# Patient Record
Sex: Female | Born: 1960
Health system: Southern US, Community
[De-identification: ages and names within clinical notes are randomized; demographics above are authoritative.]

## PROBLEM LIST (undated history)

## (undated) DIAGNOSIS — I6529 Occlusion and stenosis of unspecified carotid artery: Secondary | ICD-10-CM

## (undated) DIAGNOSIS — Z720 Tobacco use: Secondary | ICD-10-CM

## (undated) DIAGNOSIS — F1910 Other psychoactive substance abuse, uncomplicated: Secondary | ICD-10-CM

## (undated) DIAGNOSIS — I2510 Atherosclerotic heart disease of native coronary artery without angina pectoris: Secondary | ICD-10-CM

---

## 2017-11-11 DIAGNOSIS — I639 Cerebral infarction, unspecified: Secondary | ICD-10-CM

## 2017-11-11 HISTORY — DX: Cerebral infarction, unspecified: I63.9

## 2020-08-11 DIAGNOSIS — F1011 Alcohol abuse, in remission: Secondary | ICD-10-CM

## 2020-08-11 HISTORY — DX: Alcohol abuse, in remission: F10.11

## 2020-10-25 ENCOUNTER — Inpatient Hospital Stay (HOSPITAL_COMMUNITY): Payer: Medicaid Other

## 2020-10-25 ENCOUNTER — Emergency Department (HOSPITAL_COMMUNITY): Payer: Medicaid Other

## 2020-10-25 ENCOUNTER — Inpatient Hospital Stay (HOSPITAL_COMMUNITY)
Admission: EM | Admit: 2020-10-25 | Discharge: 2020-11-01 | DRG: 062 | Disposition: A | Discharge status: Rehabilitation Facility | Payer: Medicaid Other | Attending: Neurology | Admitting: Neurology

## 2020-10-25 DIAGNOSIS — Z23 Encounter for immunization: Secondary | ICD-10-CM | POA: Diagnosis not present

## 2020-10-25 DIAGNOSIS — R414 Neurologic neglect syndrome: Secondary | ICD-10-CM | POA: Diagnosis present

## 2020-10-25 DIAGNOSIS — H903 Sensorineural hearing loss, bilateral: Secondary | ICD-10-CM | POA: Diagnosis present

## 2020-10-25 DIAGNOSIS — I6381 Other cerebral infarction due to occlusion or stenosis of small artery: Secondary | ICD-10-CM | POA: Diagnosis present

## 2020-10-25 DIAGNOSIS — R112 Nausea with vomiting, unspecified: Secondary | ICD-10-CM | POA: Diagnosis not present

## 2020-10-25 DIAGNOSIS — R4781 Slurred speech: Secondary | ICD-10-CM | POA: Diagnosis present

## 2020-10-25 DIAGNOSIS — I2510 Atherosclerotic heart disease of native coronary artery without angina pectoris: Secondary | ICD-10-CM | POA: Diagnosis present

## 2020-10-25 DIAGNOSIS — R29709 NIHSS score 9: Secondary | ICD-10-CM | POA: Diagnosis present

## 2020-10-25 DIAGNOSIS — R471 Dysarthria and anarthria: Secondary | ICD-10-CM | POA: Diagnosis present

## 2020-10-25 DIAGNOSIS — I639 Cerebral infarction, unspecified: Secondary | ICD-10-CM

## 2020-10-25 DIAGNOSIS — Z20822 Contact with and (suspected) exposure to covid-19: Secondary | ICD-10-CM | POA: Diagnosis present

## 2020-10-25 DIAGNOSIS — R29810 Facial weakness: Secondary | ICD-10-CM | POA: Diagnosis present

## 2020-10-25 DIAGNOSIS — Z79891 Long term (current) use of opiate analgesic: Secondary | ICD-10-CM | POA: Diagnosis not present

## 2020-10-25 DIAGNOSIS — Z79899 Other long term (current) drug therapy: Secondary | ICD-10-CM

## 2020-10-25 DIAGNOSIS — I6523 Occlusion and stenosis of bilateral carotid arteries: Secondary | ICD-10-CM | POA: Diagnosis present

## 2020-10-25 DIAGNOSIS — Z7902 Long term (current) use of antithrombotics/antiplatelets: Secondary | ICD-10-CM

## 2020-10-25 DIAGNOSIS — I6503 Occlusion and stenosis of bilateral vertebral arteries: Secondary | ICD-10-CM | POA: Diagnosis present

## 2020-10-25 DIAGNOSIS — Z95828 Presence of other vascular implants and grafts: Secondary | ICD-10-CM

## 2020-10-25 DIAGNOSIS — H905 Unspecified sensorineural hearing loss: Secondary | ICD-10-CM

## 2020-10-25 DIAGNOSIS — I69354 Hemiplegia and hemiparesis following cerebral infarction affecting left non-dominant side: Secondary | ICD-10-CM

## 2020-10-25 DIAGNOSIS — F419 Anxiety disorder, unspecified: Secondary | ICD-10-CM | POA: Diagnosis present

## 2020-10-25 DIAGNOSIS — I6389 Other cerebral infarction: Secondary | ICD-10-CM

## 2020-10-25 DIAGNOSIS — F17210 Nicotine dependence, cigarettes, uncomplicated: Secondary | ICD-10-CM | POA: Diagnosis present

## 2020-10-25 DIAGNOSIS — W1830XA Fall on same level, unspecified, initial encounter: Secondary | ICD-10-CM | POA: Diagnosis present

## 2020-10-25 DIAGNOSIS — Z7982 Long term (current) use of aspirin: Secondary | ICD-10-CM | POA: Diagnosis not present

## 2020-10-25 DIAGNOSIS — G8191 Hemiplegia, unspecified affecting right dominant side: Secondary | ICD-10-CM | POA: Diagnosis present

## 2020-10-25 DIAGNOSIS — Z716 Tobacco abuse counseling: Secondary | ICD-10-CM

## 2020-10-25 DIAGNOSIS — E785 Hyperlipidemia, unspecified: Secondary | ICD-10-CM | POA: Diagnosis present

## 2020-10-25 DIAGNOSIS — F1010 Alcohol abuse, uncomplicated: Secondary | ICD-10-CM

## 2020-10-25 DIAGNOSIS — Z8673 Personal history of transient ischemic attack (TIA), and cerebral infarction without residual deficits: Secondary | ICD-10-CM

## 2020-10-25 DIAGNOSIS — R739 Hyperglycemia, unspecified: Secondary | ICD-10-CM | POA: Diagnosis present

## 2020-10-25 HISTORY — DX: Other psychoactive substance abuse, uncomplicated: F19.10

## 2020-10-25 HISTORY — DX: Atherosclerotic heart disease of native coronary artery without angina pectoris: I25.10

## 2020-10-25 HISTORY — DX: Occlusion and stenosis of unspecified carotid artery: I65.29

## 2020-10-25 HISTORY — DX: Tobacco use: Z72.0

## 2020-10-25 LAB — ECHOCARDIOGRAM COMPLETE
Area-P 1/2: 2.91 cm2
Height: 63 in
S' Lateral: 2.6 cm
Weight: 2387.2 oz

## 2020-10-25 LAB — CBC
HCT: 37.6 % (ref 36.0–46.0)
Hemoglobin: 12.9 g/dL (ref 12.0–15.0)
MCH: 33.1 pg (ref 26.0–34.0)
MCHC: 34.3 g/dL (ref 30.0–36.0)
MCV: 96.4 fL (ref 80.0–100.0)
Platelets: 263 10*3/uL (ref 150–400)
RBC: 3.9 MIL/uL (ref 3.87–5.11)
RDW: 12.6 % (ref 11.5–15.5)
WBC: 6 10*3/uL (ref 4.0–10.5)
nRBC: 0 % (ref 0.0–0.2)

## 2020-10-25 LAB — DIFFERENTIAL
Abs Immature Granulocytes: 0.02 10*3/uL (ref 0.00–0.07)
Basophils Absolute: 0 10*3/uL (ref 0.0–0.1)
Basophils Relative: 1 %
Eosinophils Absolute: 0.3 10*3/uL (ref 0.0–0.5)
Eosinophils Relative: 5 %
Immature Granulocytes: 0 %
Lymphocytes Relative: 22 %
Lymphs Abs: 1.3 10*3/uL (ref 0.7–4.0)
Monocytes Absolute: 0.6 10*3/uL (ref 0.1–1.0)
Monocytes Relative: 10 %
Neutro Abs: 3.8 10*3/uL (ref 1.7–7.7)
Neutrophils Relative %: 62 %

## 2020-10-25 LAB — PHOSPHORUS: Phosphorus: 3.7 mg/dL (ref 2.5–4.6)

## 2020-10-25 LAB — TROPONIN I (HIGH SENSITIVITY)
Troponin I (High Sensitivity): 7 ng/L (ref <18)
Troponin I (High Sensitivity): 9 ng/L (ref <18)

## 2020-10-25 LAB — HEMOGLOBIN A1C
Hgb A1c MFr Bld: 4.9 % (ref 4.8–5.6)
Mean Plasma Glucose: 93.93 mg/dL

## 2020-10-25 LAB — URINALYSIS, ROUTINE W REFLEX MICROSCOPIC
Bilirubin Urine: NEGATIVE
Glucose, UA: NEGATIVE mg/dL
Hgb urine dipstick: NEGATIVE
Ketones, ur: NEGATIVE mg/dL
Nitrite: NEGATIVE
Protein, ur: NEGATIVE mg/dL
Specific Gravity, Urine: 1.016 (ref 1.005–1.030)
pH: 6 (ref 5.0–8.0)

## 2020-10-25 LAB — LIPID PANEL
Cholesterol: 288 mg/dL — ABNORMAL HIGH (ref 0–200)
HDL: 61 mg/dL (ref >40)
LDL Cholesterol: 213 mg/dL — ABNORMAL HIGH (ref 0–99)
Total CHOL/HDL Ratio: 4.7 RATIO
Triglycerides: 70 mg/dL (ref <150)
VLDL: 14 mg/dL (ref 0–40)

## 2020-10-25 LAB — I-STAT CHEM 8, ED
BUN: 16 mg/dL (ref 6–20)
Calcium, Ion: 1.22 mmol/L (ref 1.15–1.40)
Chloride: 106 mmol/L (ref 98–111)
Creatinine, Ser: 0.9 mg/dL (ref 0.44–1.00)
Glucose, Bld: 108 mg/dL — ABNORMAL HIGH (ref 70–99)
HCT: 39 % (ref 36.0–46.0)
Hemoglobin: 13.3 g/dL (ref 12.0–15.0)
Potassium: 4.1 mmol/L (ref 3.5–5.1)
Sodium: 139 mmol/L (ref 135–145)
TCO2: 24 mmol/L (ref 22–32)

## 2020-10-25 LAB — PROTIME-INR
INR: 1 (ref 0.8–1.2)
Prothrombin Time: 12.3 seconds (ref 11.4–15.2)

## 2020-10-25 LAB — COMPREHENSIVE METABOLIC PANEL
ALT: 13 U/L (ref 0–44)
AST: 24 U/L (ref 15–41)
Albumin: 3.6 g/dL (ref 3.5–5.0)
Alkaline Phosphatase: 65 U/L (ref 38–126)
Anion gap: 12 (ref 5–15)
BUN: 15 mg/dL (ref 6–20)
CO2: 21 mmol/L — ABNORMAL LOW (ref 22–32)
Calcium: 9.6 mg/dL (ref 8.9–10.3)
Chloride: 104 mmol/L (ref 98–111)
Creatinine, Ser: 0.96 mg/dL (ref 0.44–1.00)
GFR, Estimated: >60 mL/min (ref >60)
Glucose, Bld: 107 mg/dL — ABNORMAL HIGH (ref 70–99)
Potassium: 4.4 mmol/L (ref 3.5–5.1)
Sodium: 137 mmol/L (ref 135–145)
Total Bilirubin: 0.5 mg/dL (ref 0.3–1.2)
Total Protein: 6.8 g/dL (ref 6.5–8.1)

## 2020-10-25 LAB — ETHANOL: Alcohol, Ethyl (B): <10 mg/dL (ref <10)

## 2020-10-25 LAB — RAPID URINE DRUG SCREEN, HOSP PERFORMED
Amphetamines: NOT DETECTED
Barbiturates: NOT DETECTED
Benzodiazepines: NOT DETECTED
Cocaine: NOT DETECTED
Opiates: NOT DETECTED
Tetrahydrocannabinol: NOT DETECTED

## 2020-10-25 LAB — I-STAT BETA HCG BLOOD, ED (MC, WL, AP ONLY): I-stat hCG, quantitative: <5 m[IU]/mL (ref <5)

## 2020-10-25 LAB — APTT: aPTT: 28 seconds (ref 24–36)

## 2020-10-25 LAB — RESP PANEL BY RT-PCR (FLU A&B, COVID) ARPGX2
Influenza A by PCR: NEGATIVE
Influenza B by PCR: NEGATIVE
SARS Coronavirus 2 by RT PCR: NEGATIVE

## 2020-10-25 LAB — MAGNESIUM: Magnesium: 2.1 mg/dL (ref 1.7–2.4)

## 2020-10-25 LAB — CBG MONITORING, ED: Glucose-Capillary: 101 mg/dL — ABNORMAL HIGH (ref 70–99)

## 2020-10-25 MED ORDER — SODIUM CHLORIDE 0.9 % IV SOLN
INTRAVENOUS | Status: DC
Start: 2020-10-25 — End: 2020-11-01

## 2020-10-25 MED ORDER — ACETAMINOPHEN 650 MG RE SUPP: 650.0000 mg | RECTAL | Status: DC | PRN

## 2020-10-25 MED ORDER — STROKE: EARLY STAGES OF RECOVERY BOOK
Freq: Once | Status: AC
  Filled 2020-10-25: qty 1

## 2020-10-25 MED ORDER — SENNOSIDES-DOCUSATE SODIUM 8.6-50 MG PO TABS: 1.0000 | ORAL_TABLET | Freq: Every evening | ORAL | Status: DC | PRN

## 2020-10-25 MED ORDER — SODIUM CHLORIDE 0.9 % IV SOLN
50.0000 mL | Freq: Once | INTRAVENOUS | Status: AC
  Administered 2020-10-25: 12:00:00 50 mL via INTRAVENOUS

## 2020-10-25 MED ORDER — ONDANSETRON HCL 4 MG/2ML IJ SOLN
4.0000 mg | Freq: Four times a day (QID) | INTRAMUSCULAR | Status: DC | PRN
  Administered 2020-10-25: 12:00:00 4 mg via INTRAVENOUS
  Filled 2020-10-25: qty 2

## 2020-10-25 MED ORDER — PANTOPRAZOLE SODIUM 40 MG IV SOLR
40.0000 mg | Freq: Every day | INTRAVENOUS | Status: DC
  Administered 2020-10-25: 23:00:00 40 mg via INTRAVENOUS
  Filled 2020-10-25: qty 40

## 2020-10-25 MED ORDER — CHLORHEXIDINE GLUCONATE CLOTH 2 % EX PADS
6.0000 | MEDICATED_PAD | Freq: Every day | CUTANEOUS | Status: DC
  Administered 2020-10-26: 15:00:00 6 via TOPICAL

## 2020-10-25 MED ORDER — ACETAMINOPHEN 160 MG/5ML PO SOLN: 650.0000 mg | ORAL | Status: DC | PRN

## 2020-10-25 MED ORDER — TRAZODONE HCL 50 MG PO TABS
75.0000 mg | ORAL_TABLET | Freq: Every day | ORAL | Status: DC
  Administered 2020-10-26 – 2020-10-31 (×6): 75 mg via ORAL
  Filled 2020-10-25 (×6): qty 2

## 2020-10-25 MED ORDER — ACETAMINOPHEN 325 MG PO TABS
650.0000 mg | ORAL_TABLET | ORAL | Status: DC | PRN
  Administered 2020-10-28: 650 mg via ORAL
  Filled 2020-10-25: qty 2

## 2020-10-25 MED ORDER — BUSPIRONE HCL 10 MG PO TABS
10.0000 mg | ORAL_TABLET | Freq: Two times a day (BID) | ORAL | Status: DC
  Administered 2020-10-26 – 2020-11-01 (×11): 10 mg via ORAL
  Filled 2020-10-25 (×11): qty 1

## 2020-10-25 MED ORDER — SERTRALINE HCL 50 MG PO TABS: 100.0000 mg | ORAL_TABLET | Freq: Every day | ORAL | Status: DC

## 2020-10-25 MED ORDER — CLEVIDIPINE BUTYRATE 0.5 MG/ML IV EMUL: 0.0000 mg/h | INTRAVENOUS | Status: DC

## 2020-10-25 MED ORDER — ALTEPLASE (STROKE) FULL DOSE INFUSION
0.9000 mg/kg | Freq: Once | INTRAVENOUS | Status: AC
  Administered 2020-10-25: 11:00:00 60.9 mg via INTRAVENOUS
  Filled 2020-10-25: qty 100

## 2020-10-25 MED ORDER — LABETALOL HCL 5 MG/ML IV SOLN: 5.0000 mg | INTRAVENOUS | Status: DC | PRN

## 2020-10-25 NOTE — ED Triage Notes (Addendum)
Pt to ED via EMS from home, pt daughter called 911 this morning because when pt called her, her speech was slurred. On EMS arrival pt was on the floor, unwitnessed fall, right side weakness and facial droop noted by EMS, a&o x 4. Follow commands. , Last known well 12 midnight last night. HX stroke . Last VS: 126/60, 98%, P 90, CBG 118. #20 R Wrist. No medications given by EMS.

## 2020-10-25 NOTE — ED Notes (Signed)
Echo at bedside

## 2020-10-25 NOTE — Consult Note (Addendum)
Neurology Consultation Reason for Consult: Right sided weakness Requesting Physician: Vanetta Mulders  CC: Right sided weakness  History is obtained from: Patient, EDP and chart review   HPI: Rebecca Sutton is a 59 y.o. female with a past medical history significant for bilateral carotid stenosis, prior right carotid stent due to stroke presenting with left-sided weakness, ongoing tobacco abuse, sensorineural hearing loss bilateral ears.   She was in her usual state of health when she went to bed last night at around midnight or 12:30 AM.  When she woke up she had right-sided weakness.  She attempted to ambulate to the bathroom but fell, and therefore called EMS for assistance.  Code stroke was activated by ED provider Dr. Vanetta Mulders after the patient arrived.  After head CT confirmed no clear acute process, MRI was obtained on my recommendations to treat per wake-up stroke criteria given the disabling nature of her deficits.  After confirmation of minimal FLAIR change on MRI with significant DWI change, tPA checklist was completed with patient and family, and tPA administration was begun.  She did experience 2 episodes of emesis after tPA was begun.  Her NIH score initially was improving, with improving movement of her right upper extremity and improving sensation, but then worsened again for which repeat head CT was obtained and showed no intracranial hemorrhage.  On review of systems, she noted no significant headaches other than her baseline headaches, no bleeding including no bloody emesis, coffee-ground emesis, blood in her stool, blood in her urine, no recent procedures, she denied fevers, chills, sweats, or any other signs and symptoms of recent infection  Regarding her prior stroke history, she presented to [facility] on 09/24/2018 with dysarthria and left facial droop, left-sided weakness and sensory deficit.  She was treated with tPA, and underwent right carotid  artery stent placement  LKW: Midnight tPA given?: Yes per wakeup stroke protocol 10:45 AM  Delayed due to need for MRI due to wake-up stroke Premorbid modified rankin scale: 0-1     0 - No symptoms.     1 - No significant disability. Able to carry out all usual activities, despite some symptoms.  ROS: A 14 point ROS was performed and is negative except as noted in the HPI.    Per outside records, additionally confirmed with patient and daughter  Carotid stenosis, bilateral, s/p L carotid stent  Acute ischemic stroke (HCC)   Cigarette smoker   Sensorineural hearing loss (SNHL) of both ears   Sudden hearing loss, left   Anxiety  Alcohol use, now quit  C-section   Family Hx:  Father: Unknown to patient Mother: hypertension, DM, cardiac disease "someone on her mother's side had a stroke"  Current Outpatient Medications: per 05/10/2020 ENT visit to be confirmed by pharmacy . aspirin 325 MG tablet *ANTIPLATELET*, Take 1 tablet (325 mg total) by mouth daily., Disp: 30 tablet, Rfl: 2 . atorvastatin (LIPITOR) 80 MG tablet, Take 1 tablet (80 mg total) by mouth daily., Disp: 90 tablet, Rfl: 3 . busPIRone (BUSPAR) 10 MG tablet, Take one (1) tablet by mouth twice a day, Disp: , Rfl:  . clopidogrel (PLAVIX) 75 mg tablet *ANTIPLATELET*, Take 1 tablet (75 mg total) by mouth daily for 28 days. Start date: 09/27/18 Stop date: 10/24/18, Disp: 28 tablet, Rfl: 0 . multivitamin (THERAGRAN) tablet, Take 1 tablet by mouth daily., Disp: 30 tablet, Rfl: 2 . nicotine (NICODERM CQ) 21 mg/24 hr, Place 1 patch onto the skin daily., Disp: 28  patch, Rfl: 0 . sertraline (ZOLOFT) 100 MG tablet, Take 200 mg by mouth., Disp: , Rfl:  . traZODone (DESYREL) 150 MG tablet, Take 75 mg by mouth daily. , Disp: , Rfl:   Social History:   Last alcohol use 09/04/2020, previously "severe alcoholic for years"   crack cocaine use- 20 years ago   tobacco use 1ppd since age 45, working on quitting and down to 1/2 ppd  currently  Exam: Current vital signs: BP 129/82   Pulse 76   Temp 98.1 F (36.7 C) (Oral)   Resp 12   Ht 5\' 3"  (1.6 m)   Wt 59 kg   SpO2 99%   BMI 23.03 kg/m  Vital signs in last 24 hours: Temp:  [98.1 F (36.7 C)] 98.1 F (36.7 C) (12/15 0928) Pulse Rate:  [76] 76 (12/15 0928) Resp:  [12] 12 (12/15 0928) BP: (129)/(82) 129/82 (12/15 0928) SpO2:  [99 %] 99 % (12/15 0928) Weight:  [59 kg] 59 kg (12/15 0930)   Physical Exam  Constitutional: Appears well-developed and well-nourished.  Psych: Affect initially appropriate to situation but then tearful to the point she couldn't answer questions well  Eyes: No scleral injection HENT: No OP obstruction, very poor dentition MSK: no joint deformities.  Cardiovascular: Perfusing extremities well, normal rate Respiratory: Effort normal, non-labored breathing GI: Soft.  No distension.  Nontender Skin: WDI  Neuro: Mental Status: Patient is awake, alert, oriented to person, place, month, and situation. Patient is able to give some history No signs of aphasia or neglect Cranial Nerves: II: Visual Fields are full. Pupils are equal, round, and reactive to light.  4->2 mm brisk III,IV, VI: EOMI for lateral gaze without ptosis or diploplia on lateral gaze V: Facial sensation is reduced to light touch on the right V2, V3 VII: Facial movement is with right lower facial droop.  VIII: hearing is intact to voice X: Uvula elevates symmetrically  XI: Shoulder shrug is symmetric. XII: tongue is midline without atrophy or fasciculations.  Motor: Tone is normal. Bulk is normal. 5/5 strength was present in all four extremities.  Sensory: Sensation is reduced on the right arm and leg, ~10%-50% compared to the left.  Deep Tendon Reflexes: 2+ and symmetric in the biceps and patellae.  Plantars: Toes are downgoing bilaterally.  Cerebellar: FNF and HKS are intact one the left and within limits of weakness on the right  NIHSS total 9 Score  breakdown:  2 points for facial droop 3 points for right upper extremity with only slight movement in the fingers, not antigravity 2 points for right lower extremity weakness 1 point for mild sensory loss 1 point for dysarthria  I have reviewed labs in epic and the results pertinent to this consultation are: Cr 0.9  I have reviewed the images obtained: CT head w/ chronic microvascular disease, no clear acute process but low sensitivity given background of chronic changes MRI brain, acute left internal capsule stroke on DWI, minimal FLAIR correlate    Impression: This is a 59 year old right-handed woman with vascular risk factors as above, presenting with a lacunar stroke which was treated with TPA by wake up criteria based on MRI brain as above.  Most likely etiology given location and small vessel disease, though atheroembolic cannot be excluded.  She did have some slight worsening when I checked on her approximately 1 hour post tPA, felt to be stuttering of her lacunar stroke with hemorrhage ruled out with repeat head CT.    # L  internal capsule stroke - Stroke labs HgbA1c, fasting lipid panel - MRI brain full protocol 24 hrs. post tPA - MRA of the brain without contrast and MRA neck w/wo  - Frequent neuro checks - Echocardiogram - Holding antiplatelets for 24 hours post tPA, will need to to clarify with patient whether she had completed DAPT for prior stroke/carotid stent given neither aspirin or Plavix were on her pharmacy reconciled medication list - Risk factor modification, smoking cessation counseling - Telemetry monitoring; 30 day event monitor on discharge if no arrythmias captured  - Blood pressure goal   - Post tPA for 24  hours < 180/105 (has not yet needed medications, but labetalol PRN ordered) - PT consult, OT consult, speech consult,  - Admitted to stroke team   # Psychiatric medications Timed to start tomorrow given patient has failed swallow evaluation;  additionally did not want to cloud neurological examination with sedating medications overnight -BuSpar 10 mg twice daily -Sertraline 100 mg daily -Trazodone 75 mg nightly  #Nausea and emesis -EKG and troponins obtained, not concerning for ACS -Zofran administered after confirming QTC less than 500 on EKG -Every 4 hours point-of-care glucose while n.p.o. given urine ketones  Brooke Dare MD-PhD [facility] [phone]  75 minutes were spent in critical care of this patient

## 2020-10-25 NOTE — Progress Notes (Signed)
Patient with another episode of emesis, complains of headache that she states she has had on-going since arrival but has not worsened. NIH stable. Dr. Iver Nestle notified of episode of emesis, no new orders. PRN given.  Aris Lot, RN Stroke Response

## 2020-10-25 NOTE — Progress Notes (Signed)
Verbal order from Dr. Iver Nestle for STAT head CT r/t worsening R weakness, obtained and patient back to ED30.   Aris Lot, RN Stroke Response

## 2020-10-25 NOTE — ED Provider Notes (Signed)
[facility] EMERGENCY DEPARTMENT Provider Note   CSN: [id number] Arrival date & time: 10/25/20  5638     History Chief Complaint  Patient presents with   Fall   Weakness    Right side     Rebecca Sutton is a 59 y.o. female.  According to daughter.  They were concerned about stroke is why they called EMS.  Daughter did a Museum/gallery curator with her mother and could see the deficits.  Mother states she went to bed around 1230.  This is after midnight.  And had some questions of may be some quivering in the right lower extremity when she went to bed.  But no other symptoms.  She woke sometime around 7 in the morning had right facial droop significant right arm weakness and right leg weakness.  Try to go to the bathroom and fell.  He was on the floor when EMS arrived.  Complaining of any pain or injuries from the fall.  Daughter states that sometime in the past at [facility] she had a stent placed.  Due to prior stroke that resulted in left sided deficits.  Daughter says the stents on the left which does not necessarily add up.  Patient does meet stroke criteria although may be out of the TPA window.  So code stroke activated.        No past medical history on file.  There are no problems to display for this patient.      OB History   No obstetric history on file.     No family history on file.     Home Medications Prior to Admission medications   Not on File    Allergies    Patient has no allergy information on record.  Review of Systems   Review of Systems  Constitutional: Negative for chills and fever.  HENT: Negative for congestion, rhinorrhea and sore throat.   Eyes: Negative for visual disturbance.  Respiratory: Negative for cough and shortness of breath.   Cardiovascular: Negative for chest pain and leg swelling.  Gastrointestinal: Negative for abdominal pain, diarrhea, nausea and vomiting.  Genitourinary: Negative for dysuria.   Musculoskeletal: Negative for back pain and neck pain.  Skin: Negative for rash.  Neurological: Positive for facial asymmetry, speech difficulty and weakness. Negative for dizziness, light-headedness and headaches.  Hematological: Does not bruise/bleed easily.  Psychiatric/Behavioral: Negative for confusion.    Physical Exam Updated Vital Signs BP (!) 143/71    Pulse 73    Temp 98.1 F (36.7 C) (Oral)    Resp 13    Ht 1.6 m (5\' 3" )    Wt 67.7 kg    SpO2 100%    BMI 26.43 kg/m   Physical Exam Vitals and nursing note reviewed.  Constitutional:      General: She is not in acute distress.    Appearance: Normal appearance. She is well-developed and well-nourished.  HENT:     Head: Normocephalic and atraumatic.  Eyes:     Extraocular Movements: Extraocular movements intact.     Conjunctiva/sclera: Conjunctivae normal.     Pupils: Pupils are equal, round, and reactive to light.  Cardiovascular:     Rate and Rhythm: Normal rate and regular rhythm.     Heart sounds: No murmur heard.   Pulmonary:     Effort: Pulmonary effort is normal. No respiratory distress.     Breath sounds: Normal breath sounds.  Abdominal:     Palpations: Abdomen is  soft.     Tenderness: There is no abdominal tenderness.  Musculoskeletal:        General: No swelling or edema.     Cervical back: Normal range of motion and neck supple.  Skin:    General: Skin is warm and dry.  Neurological:     Mental Status: She is alert.     Cranial Nerves: Cranial nerve deficit present.     Sensory: Sensory deficit present.     Motor: Weakness present.     Comments: Patient with significant right facial droop.  Forehead spared.  Right upper extremity greater than right lower extremity weakness.  Hardly any movement of the right arm.  Can raise right leg up and hold it for about a 2 count.  Left side normal.  Decreased sensation on right leg.  Psychiatric:        Mood and Affect: Mood and affect normal.     ED Results  / Procedures / Treatments   Labs (all labs ordered are listed, but only abnormal results are displayed) Labs Reviewed  COMPREHENSIVE METABOLIC PANEL - Abnormal; Notable for the following components:      Result Value   CO2 21 (*)    Glucose, Bld 107 (*)    All other components within normal limits  I-STAT CHEM 8, ED - Abnormal; Notable for the following components:   Glucose, Bld 108 (*)    All other components within normal limits  PROTIME-INR  APTT  CBC  DIFFERENTIAL  ETHANOL  RAPID URINE DRUG SCREEN, HOSP PERFORMED  URINALYSIS, ROUTINE W REFLEX MICROSCOPIC  I-STAT BETA HCG BLOOD, ED ([facility], [facility], [facility] ONLY)    EKG EKG Interpretation  Date/Time:  Wednesday October 25 2020 09:29:54 EST Ventricular Rate:  76 PR Interval:    QRS Duration: 85 QT Interval:  430 QTC Calculation: 484 R Axis:   87 Text Interpretation: Sinus rhythm ST elevation, consider anterior injury No previous ECGs available Confirmed by Vanetta Mulders [id number] on 10/25/2020 10:15:27 AM Also confirmed by Vanetta Mulders [id number]), editor Elita Quick ([id number])  on 10/25/2020 10:36:45 AM   Radiology CT HEAD CODE STROKE WO CONTRAST  Result Date: 10/25/2020 CLINICAL DATA:  Code stroke. EXAM: CT HEAD WITHOUT CONTRAST TECHNIQUE: Contiguous axial images were obtained from the base of the skull through the vertex without intravenous contrast. COMPARISON:  None. FINDINGS: Brain: There is no acute intracranial hemorrhage, mass effect, or edema. Gray-white differentiation is preserved. Ventricles and sulci are normal in size and configuration. There is no extra-axial collection. Vascular: No hyperdense vessel. There is intracranial atherosclerotic calcification at the skull base. Skull: Unremarkable Sinuses/Orbits: Minimally imaged right maxillary sinus is opacified. Otherwise minor paranasal sinus mucosal thickening. Orbits are unremarkable. Other: Mastoid air cells are clear. ASPECTS ([place] Stroke Program Early CT  Score) - Ganglionic level infarction (caudate, lentiform nuclei, internal capsule, insula, M1-M3 cortex): 7 - Supraganglionic infarction (M4-M6 cortex): 3 Total score (0-10 with 10 being normal): 10 IMPRESSION: There is no acute intracranial hemorrhage or evidence of acute infarction. ASPECT score is 10. These results were communicated to Dr. Iver Nestle at 10:06 am on 10/25/2020 by text page via the [facility] messaging system. Electronically Signed   By: Guadlupe Spanish M.D.   On: 10/25/2020 10:09    Procedures Procedures (including critical care time) CRITICAL CARE Performed by: Vanetta Mulders Total critical care time: 45 minutes Critical care time was exclusive of separately billable procedures and treating other patients. Critical care was necessary to treat or prevent imminent or  life-threatening deterioration. Critical care was time spent personally by me on the following activities: development of treatment plan with patient and/or surrogate as well as nursing, discussions with consultants, evaluation of patient's response to treatment, examination of patient, obtaining history from patient or surrogate, ordering and performing treatments and interventions, ordering and review of laboratory studies, ordering and review of radiographic studies, pulse oximetry and re-evaluation of patient's condition.   Medications Ordered in ED Medications - No data to display  ED Course  I have reviewed the triage vital signs and the nursing notes.  Pertinent labs & imaging results that were available during my care of the patient were reviewed by me and considered in my medical decision making (see chart for details).    MDM Rules/Calculators/A&P                          Patient brought in by EMS.  But did not arrive as a concern for code stroke.  There was evidence of right-sided weakness and fall listed by nursing.  Patient was here for about 20 minutes before the hand to be her EKG which alerted me to the  concern of potential code stroke patient.  Patient last normal may be 1230 but there was some question that when she went to bed around that time shortly after midnight that there may have been a funny feeling in her right lower extremity.  She awoke around 7 and definitely had deficits the facial droop and right-sided weakness.  Tried to get out of bed fell.  Daughter contacted EMS with concerns for stroke apparently she has had a stroke in the past.  Daughter states that she has a stent in the left side and her previous stroke was left-sided weakness.  This was dealt with at [facility] but that does not quite add up.   Stroke initiated.  Seen by neuro team.  Patient based on her MR I angios was a candidate for TPA on the when they awoke protocol.  And patient is got TPA ongoing neurology will admit.    Final Clinical Impression(s) / ED Diagnoses Final diagnoses:  Cerebrovascular accident (CVA), unspecified mechanism (HCC)    Rx / DC Orders ED Discharge Orders    None       Vanetta Mulders, MD 10/25/20 1134

## 2020-10-25 NOTE — H&P (Signed)
Please see consult note from the same date

## 2020-10-25 NOTE — Progress Notes (Signed)
PHARMACIST CODE STROKE RESPONSE  Notified to mix tPA at 1039 by Dr. Iver Nestle Delivered tPA to RN at 1043  tPA dose = 6.1mg  bolus over 1 minute followed by 54.8mg  for a total dose of 60.9mg  over 1 hour  Issues/delays encountered (if applicable): N/a  Gerrit Halls, PharmD Clinical Pharmacist  10/25/20 10:45 AM

## 2020-10-25 NOTE — Progress Notes (Signed)
Echocardiogram 2D Echocardiogram has been performed.  Rebecca Sutton 10/25/2020, 3:22 PM

## 2020-10-25 NOTE — Code Documentation (Addendum)
Stroke Response Nurse Documentation Code Stroke Documentation  Rebecca Sutton. Bernath is a 59 y.o. F arriving to [place] ED via [facility] on 10/25/2020 with PMH of ETOH, Seizure ETOH withdrawal, CAD, Depression, CVA 09/2018 with residual L deficit. Code stroke was activated by ED following arrival with EMS. Patient from home where she was LKW at midnight when she went to sleep, was found down this morning by EMS on arrival with R facial droop and R side weakness. EMS called by daughter d/t her mother's slurred speech on the phone this AM. Patient taking aspirin 325 mg daily and clopidogrel 75 mg daily PTA. Stroke team met patient in CT, labs drawn o/a and patient cleared for CT by Dr. Rogene Houston. NIHSS 9, see documentation for details and code stroke times. Patient with right facial droop, right arm weakness, right leg weakness, right decreased sensation and Sensory  neglect on exam. The following imaging was completed:  CT, WAKE-UP MRI. Patient is a candidate for tPA per Dr. Curly Shores, tPA hung at 1044. Care/Plan: VS/mNIHSS q40m2h, q390mh, q1hx16h. Full NIH to be completed at 1244, 2 hours post-tPA. Bedside handoff with ED RN AsCaryl Pina   1047: tPA paused for episode of emesis, Dr. BhCurly Shorest bedside and assessing patient. Assessment stable, tPA re-started at 1054.  MeLenore MannerStroke Response RN [phone]

## 2020-10-26 ENCOUNTER — Inpatient Hospital Stay (HOSPITAL_COMMUNITY): Payer: Medicaid Other

## 2020-10-26 ENCOUNTER — Other Ambulatory Visit: Payer: Self-pay

## 2020-10-26 DIAGNOSIS — I6523 Occlusion and stenosis of bilateral carotid arteries: Secondary | ICD-10-CM

## 2020-10-26 DIAGNOSIS — F17200 Nicotine dependence, unspecified, uncomplicated: Secondary | ICD-10-CM

## 2020-10-26 DIAGNOSIS — I63232 Cerebral infarction due to unspecified occlusion or stenosis of left carotid arteries: Secondary | ICD-10-CM

## 2020-10-26 DIAGNOSIS — I639 Cerebral infarction, unspecified: Secondary | ICD-10-CM

## 2020-10-26 LAB — MRSA PCR SCREENING: MRSA by PCR: POSITIVE — AB

## 2020-10-26 LAB — GLUCOSE, CAPILLARY
Glucose-Capillary: 90 mg/dL (ref 70–99)
Glucose-Capillary: 105 mg/dL — ABNORMAL HIGH (ref 70–99)

## 2020-10-26 LAB — HIV ANTIBODY (ROUTINE TESTING W REFLEX): HIV Screen 4th Generation wRfx: NONREACTIVE

## 2020-10-26 MED ORDER — SERTRALINE HCL 50 MG PO TABS: 100.0000 mg | ORAL_TABLET | Freq: Every day | ORAL | Status: DC

## 2020-10-26 MED ORDER — CHLORHEXIDINE GLUCONATE CLOTH 2 % EX PADS
6.0000 | MEDICATED_PAD | Freq: Every day | CUTANEOUS | Status: AC
  Administered 2020-10-27: 07:00:00 6 via TOPICAL

## 2020-10-26 MED ORDER — ENOXAPARIN SODIUM 40 MG/0.4ML ~~LOC~~ SOLN
40.0000 mg | SUBCUTANEOUS | Status: DC
  Administered 2020-10-26 – 2020-10-31 (×6): 40 mg via SUBCUTANEOUS
  Filled 2020-10-26 (×6): qty 0.4

## 2020-10-26 MED ORDER — INFLUENZA VAC SPLIT QUAD 0.5 ML IM SUSY
0.5000 mL | PREFILLED_SYRINGE | INTRAMUSCULAR | Status: AC
  Administered 2020-10-27: 14:00:00 0.5 mL via INTRAMUSCULAR
  Filled 2020-10-26: qty 0.5

## 2020-10-26 MED ORDER — MUPIROCIN 2 % EX OINT
1.0000 "application " | TOPICAL_OINTMENT | Freq: Two times a day (BID) | CUTANEOUS | Status: AC
  Administered 2020-10-26 – 2020-10-30 (×10): 1 via NASAL
  Filled 2020-10-26 (×2): qty 22

## 2020-10-26 MED ORDER — PANTOPRAZOLE SODIUM 40 MG PO TBEC
40.0000 mg | DELAYED_RELEASE_TABLET | Freq: Every day | ORAL | Status: DC
  Administered 2020-10-27 – 2020-11-01 (×6): 40 mg via ORAL
  Filled 2020-10-26 (×6): qty 1

## 2020-10-26 MED ORDER — CLOPIDOGREL BISULFATE 75 MG PO TABS
75.0000 mg | ORAL_TABLET | Freq: Every day | ORAL | Status: DC
  Administered 2020-10-27 – 2020-11-01 (×6): 75 mg via ORAL
  Filled 2020-10-26 (×6): qty 1

## 2020-10-26 MED ORDER — CLOPIDOGREL BISULFATE 75 MG PO TABS
300.0000 mg | ORAL_TABLET | Freq: Once | ORAL | Status: AC
  Administered 2020-10-26: 18:00:00 300 mg via ORAL
  Filled 2020-10-26: qty 4

## 2020-10-26 MED ORDER — SERTRALINE HCL 100 MG PO TABS
100.0000 mg | ORAL_TABLET | Freq: Every day | ORAL | Status: DC
  Administered 2020-10-26 – 2020-10-30 (×5): 100 mg via ORAL
  Filled 2020-10-26: qty 2
  Filled 2020-10-26 (×4): qty 1

## 2020-10-26 MED ORDER — IOHEXOL 350 MG/ML SOLN
75.0000 mL | Freq: Once | INTRAVENOUS | Status: AC | PRN
  Administered 2020-10-26: 75 mL via INTRAVENOUS

## 2020-10-26 MED ORDER — ASPIRIN EC 325 MG PO TBEC
325.0000 mg | DELAYED_RELEASE_TABLET | Freq: Every day | ORAL | Status: DC
  Administered 2020-10-26 – 2020-10-29 (×4): 325 mg via ORAL
  Filled 2020-10-26 (×4): qty 1

## 2020-10-26 MED ORDER — LABETALOL HCL 5 MG/ML IV SOLN: 5.0000 mg | INTRAVENOUS | Status: DC | PRN

## 2020-10-26 NOTE — Evaluation (Signed)
Physical Therapy Evaluation Patient Details Name: Rebecca Sutton MRN: [id number] DOB: February 08, 1961 Today's Date: 10/26/2020   History of Present Illness  59 y.o. female admitted to ED On 12/15 with R weakness, facial droop, and fall. 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  Clinical Impression   Pt presents with R hemiparesis RUE>RLE, flexor RLE synergy, impaired coordination, impaired sitting and standing balance, difficulty performing mobility tasks, and decreased activity tolerance. Pt to benefit from acute PT to address deficits. Pt overall requiring mod +2 for bed mobility and transfer to recliner at bedside, pt demonstrating RLE flexor synergy as well as buckling in stance. At baseline, pt is completely independent. PT recommending CIR to maximize functional independence, per pt her daughter can assist her PRN and mother can 24/7. Pt's mother is functionally independent and lives closeby to pt, pt expresses she could stay with mother after d/c. PT to progress mobility as tolerated, and will continue to follow acutely.      Follow Up Recommendations CIR;Supervision/Assistance - 24 hour    Equipment Recommendations  None recommended by PT    Recommendations for Other Services       Precautions / Restrictions Precautions Precautions: Fall Precaution Comments: R hemiparesis RUE>LE Restrictions Weight Bearing Restrictions: No      Mobility  Bed Mobility Overal bed mobility: Needs Assistance Bed Mobility: Supine to Sit     Supine to sit: Mod assist;+2 for physical assistance;HOB elevated     General bed mobility comments: Mod +2 for trunk elevation, LE translation to EOB. Pt able to scoot to EOB without PT or OT assist via lateral weight shifting     Transfers Overall transfer level: Needs assistance Equipment used: 2 person hand held assist Transfers: Sit to/from [employer] Sit to Stand: Mod assist;+2 physical assistance Stand pivot transfers: Mod assist;+2 physical assistance       General transfer comment: Mod +2 for power up, placing RLE on floor via tactile input to dorsum of foot as pt in foot supination, R knee blocking in stance, steadying, and step-by-step cuing for stepping to reach recliner placed to pt L.  Ambulation/Gait             General Gait Details: NT  Stairs            Wheelchair Mobility    Modified Rankin (Stroke Patients Only) Modified Rankin (Stroke Patients Only) Pre-Morbid Rankin Score: No symptoms Modified Rankin: Moderately severe disability     Balance Overall balance assessment: Needs assistance Sitting-balance support: Feet supported;Single extremity supported Sitting balance-Leahy Scale: Fair Sitting balance - Comments: able to sit EOB statically without PT or OT support. Requires min posterior and R lateral trunk assist with dynamic tasks (i.e. donning socks) Postural control: Posterior lean;Right lateral lean Standing balance support: Bilateral upper extremity supported;During functional activity Standing balance-Leahy Scale: Poor Standing balance comment: reliant on external support                             Pertinent Vitals/Pain Pain Assessment: No/denies pain Pain Intervention(s): Monitored during session    Home Living Family/patient expects to be discharged to:: Private residence Living Arrangements: Non-relatives/Friends (female roommate, rents a room in her house and works during the day) Available Help at Discharge: Family;Available PRN/intermittently (daughter  lives in [place], pt lives in [place]) Type of Home: House Home Access: Stairs to enter Entrance Stairs-Rails: Can reach both Entrance Stairs-Number of Steps: 2 Home  Layout: One level Home Equipment: Environmental consultant - 2 wheels;Bedside commode;Wheelchair - manual Additional Comments: has access to equipment, from late brother (passed in 09/27/23)    Prior Function Level of Independence: Independent         Comments: does everything for self, does not drive but has mother/daughter to take her places     Hand Dominance   Dominant Hand: Right    Extremity/Trunk Assessment   Upper Extremity Assessment Upper Extremity Assessment: Defer to OT evaluation    Lower Extremity Assessment Lower Extremity Assessment: RLE deficits/detail RLE Deficits / Details: 2+/5 knee extension, 2/5 hip flexion (modified, in supine), 3/5 hip abd/add (modified, in supine), 0/5 PF/DF. flexor synergy noted in WB RLE Sensation: WNL    Cervical / Trunk Assessment Cervical / Trunk Assessment: Normal  Communication   Communication: No difficulties  Cognition Arousal/Alertness: Awake/alert Behavior During Therapy: WFL for tasks assessed/performed Overall Cognitive Status: Within Functional Limits for tasks assessed                                 General Comments: A&Ox4. Answers questions appropriately, pleasant and motivated. Pt is tearful x2 during session, when talking about late brother who died in 27-Sep-2023 and post-transfer to recliner.      General Comments      Exercises     Assessment/Plan    PT Assessment Patient needs continued PT services  PT Problem List Decreased strength;Decreased mobility;Decreased safety awareness;Impaired tone;Decreased activity tolerance;Decreased balance;Decreased knowledge of use of DME;Decreased range of motion;Decreased coordination       PT Treatment Interventions DME instruction;Therapeutic activities;Gait training;Therapeutic exercise;Patient/family education;Balance training;Stair training;Functional mobility training;Neuromuscular re-education    PT Goals (Current goals can be found in the Care Plan section)   Acute Rehab PT Goals Patient Stated Goal: go home PT Goal Formulation: With patient Time For Goal Achievement: 11/09/20 Potential to Achieve Goals: Good    Frequency Min 4X/week   Barriers to discharge        Co-evaluation PT/OT/SLP Co-Evaluation/Treatment: Yes Reason for Co-Treatment: Complexity of the patient's impairments (multi-system involvement);For patient/therapist safety;To address functional/ADL transfers PT goals addressed during session: Mobility/safety with mobility;Balance;Strengthening/ROM         AM-PAC PT "6 Clicks" Mobility  Outcome Measure Help needed turning from your back to your side while in a flat bed without using bedrails?: A Lot Help needed moving from lying on your back to sitting on the side of a flat bed without using bedrails?: A Lot Help needed moving to and from a bed to a chair (including a wheelchair)?: A Lot Help needed standing up from a chair using your arms (e.g., wheelchair or bedside chair)?: A Lot Help needed to walk in hospital room?: Total Help needed climbing 3-5 steps with a railing? : Total 6 Click Score: 10    End of Session Equipment Utilized During Treatment: Gait belt Activity Tolerance: Patient tolerated treatment well Patient left: in chair;with call bell/phone within reach;with chair alarm set Nurse Communication: Mobility status;Other (comment) (active bedrest orders in order set, RN verbally d/ced orders and will d/c when out of another pt room per RN report) PT Visit Diagnosis: Hemiplegia and hemiparesis;Other abnormalities of gait and mobility (R26.89) Hemiplegia - Right/Left: Right Hemiplegia - dominant/non-dominant: Dominant Hemiplegia - caused by:  Cerebral infarction    Time: 1202-1233 PT Time Calculation (min) (ACUTE ONLY): 31 min   Charges:   PT Evaluation $PT Eval Low Complexity: 1 Low         Raynold Blankenbaker S, PT Acute Rehabilitation Services Pager [phone]  Office [phone]  Truddie Coco 10/26/2020,  2:48 PM

## 2020-10-26 NOTE — Evaluation (Signed)
Clinical/Bedside Swallow Evaluation Patient Details  Name: MARIBELLA KUNA MRN: [id number] Date of Birth: Jun 18, 1961  Today's Date: 10/26/2020 Time: SLP Start Time (ACUTE ONLY): 8891 SLP Stop Time (ACUTE ONLY): 0943 SLP Time Calculation (min) (ACUTE ONLY): 14 min  Past Medical History:  Past Medical History:  Diagnosis Date  . CAD (coronary artery disease)   . Carotid stenosis    bilateral  . CVA (cerebral vascular accident) (HCC) 2019  . H/O ETOH abuse 08/2020  . Smoking trying to quit    1/2 PPD  . Substance abuse (HCC)    last used 20 years ago   Past Surgical History: The histories are not reviewed yet. Please review them in the "History" navigator section and refresh this SmartLink. HPI:  Rebecca Sutton is a 59 y.o. female with a past medical history significant for bilateral carotid stenosis, prior right carotid stent due to stroke presenting with left-sided weakness, ongoing tobacco abuse, sensorineural hearing loss bilateral ears. MRI 12/15: "Acute infarct posterior limb internal capsule on the left with  minimal FLAIR signal intensity"  Pt s/p tPA   Assessment / Plan / Recommendation Clinical Impression  Pt presents with mild oral dysphagia c/b prolonged oral transit and increased mastication time.  Pt tolerate all consistencies trialed with no overt s/s of aspiration.  Multiple swallows noted intermittently, but pt was able to take serial straw sips without difficulty.  Despite significant R facial droop and decreased labial ROM, pt was able to retrieve thin liquid from cup and fully contain bolus with no anterior spillage.  Pt exhibited good oral clearance of regular solid with independent use of lingual sweep.  Pt stated that chewing regular cracker was hard, and preferred soft solid trial.    Recommend mechanical soft diet with thin liquids.  Pt's speech is moderately dysarthria.  Pt denies word finding difficulties and is able to participate in conversation.  Pt denies  changes to thinking or memory or increased confusion.  Complete cognitive-linguistic evaluation to follow.   SLP Visit Diagnosis: Dysphagia, oral phase (R13.11)    Aspiration Risk  Mild aspiration risk    Diet Recommendation Dysphagia 3 (Mech soft);Thin liquid   Liquid Administration via: Cup;Straw Medication Administration: Whole meds with liquid Supervision: Staff to assist with self feeding Compensations: Slow rate;Small sips/bites Postural Changes: Seated upright at 90 degrees    Other  Recommendations Oral Care Recommendations: Oral care BID   Follow up Recommendations Inpatient Rehab      Frequency and Duration min 2x/week  2 weeks       Prognosis Prognosis for Safe Diet Advancement: Good      Swallow Study   General Date of Onset: 10/25/20 HPI: Rebecca Sutton is a 59 y.o. female with a past medical history significant for bilateral carotid stenosis, prior right carotid stent due to stroke presenting with left-sided weakness, ongoing tobacco abuse, sensorineural hearing loss bilateral ears. MRI 12/15: "Acute infarct posterior limb internal capsule on the left with  minimal FLAIR signal intensity"  Pt s/p tPA Type of Study: Bedside Swallow Evaluation Diet Prior to this Study: NPO Temperature Spikes Noted: No Respiratory Status: Nasal cannula History of Recent Intubation: No Behavior/Cognition: Alert;Cooperative;Pleasant mood Oral Cavity Assessment: Within Functional Limits Oral Care Completed by SLP: No Oral Cavity - Dentition: Missing dentition Vision: Functional for self-feeding Self-Feeding Abilities: Able to feed self Patient Positioning: Upright in bed Baseline Vocal Quality: Normal Volitional Cough: Strong Volitional Swallow: Able to elicit    Oral/Motor/Sensory Function Overall Oral Motor/Sensory  Function: Mild impairment Facial ROM: Reduced right Facial Symmetry: Abnormal symmetry right Lingual ROM: Within Functional Limits Lingual Symmetry: Abnormal  symmetry right Lingual Strength: Within Functional Limits Velum: Within Functional Limits Mandible: Within Functional Limits   Ice Chips Ice chips: Within functional limits Presentation: Spoon   Thin Liquid Thin Liquid: Impaired Presentation: Straw;Cup Pharyngeal  Phase Impairments: Multiple swallows    Nectar Thick Nectar Thick Liquid: Not tested   Honey Thick Honey Thick Liquid: Not tested   Puree Puree: Within functional limits Presentation: Spoon   Solid     Solid: Impaired Oral Phase Functional Implications: Prolonged oral transit      Kerrie Pleasure, MA, CCC-SLP Acute Rehabilitation Services Office: [phone] 10/26/2020,10:02 AM

## 2020-10-26 NOTE — Progress Notes (Signed)
STROKE TEAM PROGRESS NOTE   INTERVAL HISTORY RNs are at bedside.  Patient sitting in chair, still has right facial droop, right arm leg weakness.  She had a stent at right carotid artery 2 years ago in [facility] with right MCA stroke.  MRI at this time showed left internal capsule infarct.  CT head and neck showed right ICA stent patent, however left proximal CCA high-grade stenosis.  Discussed with patient, she is agreeable for cerebral angiogram tomorrow for further evaluation.  Vitals:   10/26/20 0800 10/26/20 0900 10/26/20 1000 10/26/20 1100  BP: 131/77 137/73 123/69 (!) 145/83  Pulse: 65 75 71   Resp: 13 11 14    Temp: 98.9 F (37.2 C)     TempSrc: Oral     SpO2: 100% 99% 99%   Weight:      Height:       CBC:  Recent Labs  Lab 10/25/20 0941 10/25/20 0948  WBC 6.0  --   NEUTROABS 3.8  --   HGB 12.9 13.3  HCT 37.6 39.0  MCV 96.4  --   PLT 263  --    Basic Metabolic Panel:  Recent Labs  Lab 10/25/20 0941 10/25/20 0948  NA 137 139  K 4.4 4.1  CL 104 106  CO2 21*  --   GLUCOSE 107* 108*  BUN 15 16  CREATININE 0.96 0.90  CALCIUM 9.6  --   MG 2.1  --   PHOS 3.7  --    Lipid Panel:  Recent Labs  Lab 10/25/20 0941  CHOL 288*  TRIG 70  HDL 61  CHOLHDL 4.7  VLDL 14  LDLCALC 10/27/20*   HgbA1c:  Recent Labs  Lab 10/25/20 1113  HGBA1C 4.9   Urine Drug Screen:  Recent Labs  Lab 10/25/20 2033  LABOPIA NONE DETECTED  COCAINSCRNUR NONE DETECTED  LABBENZ NONE DETECTED  AMPHETMU NONE DETECTED  THCU NONE DETECTED  LABBARB NONE DETECTED    Alcohol Level  Recent Labs  Lab 10/25/20 0941  ETH <10    IMAGING past 24 hours CT HEAD WO CONTRAST  Result Date: 10/25/2020 CLINICAL DATA:  Altered mental status. EXAM: CT HEAD WITHOUT CONTRAST TECHNIQUE: Contiguous axial images were obtained from the base of the skull through the vertex without intravenous contrast. COMPARISON:  Same day. FINDINGS: Brain: No evidence of acute infarction, hemorrhage, hydrocephalus,  extra-axial collection or mass lesion/mass effect. Vascular: No hyperdense vessel or unexpected calcification. Skull: Normal. Negative for fracture or focal lesion. Sinuses/Orbits: Right maxillary sinusitis is noted. Other: None. IMPRESSION: Right maxillary sinusitis. No acute intracranial abnormality seen. Electronically Signed   By: 10/27/2020 M.D.   On: 10/25/2020 13:01   MR BRAIN WO CONTRAST  Result Date: 10/26/2020 CLINICAL DATA:  Stroke, follow-up; status post tPA for ischemic stroke. Right facial droop and right-sided weakness. EXAM: MRI HEAD WITHOUT CONTRAST TECHNIQUE: Multiplanar, multiecho pulse sequences of the brain and surrounding structures were obtained without intravenous contrast. COMPARISON:  MRI/MRA head 10/25/2020, head CT 10/25/2020. FINDINGS: Brain: Mild intermittent motion degradation. Mild cerebral atrophy. An acute infarct within the left thalamocapsular junction has slightly increased in size as compared to the brain MRI performed earlier today, now measuring 17 mm (previously 15 mm). Corresponding T2/FLAIR hyperintensity at this site. Background mild multifocal T2/FLAIR hyperintensity within the cerebral white matter and pons is nonspecific, but compatible with chronic small vessel ischemic disease. No evidence of intracranial mass. No chronic intracranial blood products. No extra-axial fluid collection. No midline shift. Vascular: Expected  proximal arterial flow voids. Skull and upper cervical spine: No focal marrow lesion. Sinuses/Orbits: Visualized orbits show no acute finding. Complete T2 hyperintense opacification of the right maxillary sinus. Mild ethmoid sinus mucosal thickening. IMPRESSION: 17 mm acute infarct within the left thalamocapsular junction, minimally increased in size as compared to the brain MRI performed earlier today. Background mild cerebral atrophy and chronic small vessel ischemic disease. Right maxillary sinusitis. Electronically Signed   By: Jackey Loge DO   On: 10/26/2020 11:21   ECHOCARDIOGRAM COMPLETE  Result Date: 10/25/2020    ECHOCARDIOGRAM REPORT   Patient Name:   ANA WOODROOF Date of Exam: 10/25/2020 Medical Rec #:  [id number]     Height:       63.0 in Accession #:    [id number]    Weight:       149.2 lb Date of Birth:  02-17-1961     BSA:          1.707 m Patient Age:    59 years      BP:           100/62 mmHg Patient Gender: F             HR:           73 bpm. Exam Location:  Inpatient Procedure: 2D Echo, Color Doppler and Cardiac Doppler Indications:    Stroke i163.9  History:        Patient has prior history of Echocardiogram examinations, most                 recent 09/24/2018. Prior echo performed at [facility].  Sonographer:    Irving Burton Senior RDCS Referring Phys: [id number] Kara Mead IMPRESSIONS  1. Left ventricular ejection fraction, by estimation, is 60 to 65%. The left ventricle has normal function. The left ventricle has no regional wall motion abnormalities. Left ventricular diastolic parameters are indeterminate. Elevated left ventricular end-diastolic pressure.  2. Right ventricular systolic function is normal. The right ventricular size is normal. Tricuspid regurgitation signal is inadequate for assessing PA pressure.  3. The mitral valve is normal in structure. Trivial mitral valve regurgitation. No evidence of mitral stenosis.  4. The aortic valve is tricuspid. Aortic valve regurgitation is not visualized. Mild aortic valve sclerosis is present, with no evidence of aortic valve stenosis.  5. There is Moderate (Grade III) layered plaque involving the transverse aorta.  6. The inferior vena cava is normal in size with greater than 50% respiratory variability, suggesting right atrial pressure of 3 mmHg. Consider Chest CT to evaluate aorta for aortic atherosclerosis. FINDINGS  Left Ventricle: Left ventricular ejection fraction, by estimation, is 60 to 65%. The left ventricle has normal function. The left ventricle has no regional wall  motion abnormalities. The left ventricular internal cavity size was normal in size. There is  no left ventricular hypertrophy. Left ventricular diastolic parameters are indeterminate. Elevated left ventricular end-diastolic pressure. Right Ventricle: The right ventricular size is normal. No increase in right ventricular wall thickness. Right ventricular systolic function is normal. Tricuspid regurgitation signal is inadequate for assessing PA pressure. Left Atrium: Left atrial size was normal in size. Right Atrium: Right atrial size was normal in size. Pericardium: There is no evidence of pericardial effusion. Mitral Valve: The mitral valve is normal in structure. There is mild thickening of the mitral valve leaflet(s). There is mild calcification of the mitral valve leaflet(s). Trivial mitral valve regurgitation. No evidence of mitral valve stenosis. MV peak gradient, 10.1 mmHg.  The mean mitral valve gradient is 6.0 mmHg. Tricuspid Valve: The tricuspid valve is normal in structure. Tricuspid valve regurgitation is not demonstrated. No evidence of tricuspid stenosis. Aortic Valve: The aortic valve is tricuspid. Aortic valve regurgitation is not visualized. Mild aortic valve sclerosis is present, with no evidence of aortic valve stenosis. Pulmonic Valve: The pulmonic valve was normal in structure. Pulmonic valve regurgitation is not visualized. No evidence of pulmonic stenosis. Aorta: The aortic root is normal in size and structure. There is moderate (Grade III) layered plaque involving the transverse aorta. Venous: The inferior vena cava is normal in size with greater than 50% respiratory variability, suggesting right atrial pressure of 3 mmHg. IAS/Shunts: No atrial level shunt detected by color flow Doppler.  LEFT VENTRICLE PLAX 2D LVIDd:         4.00 cm  Diastology LVIDs:         2.60 cm  LV e' medial:    3.92 cm/s LV PW:         1.20 cm  LV E/e' medial:  36.0 LV IVS:        1.00 cm  LV e' lateral:   6.42 cm/s  LVOT diam:     1.60 cm  LV E/e' lateral: 22.0 LV SV:         48 LV SV Index:   28 LVOT Area:     2.01 cm  RIGHT VENTRICLE RV S prime:     7.83 cm/s TAPSE (M-mode): 1.9 cm LEFT ATRIUM             Index       RIGHT ATRIUM          Index LA diam:        3.50 cm 2.05 cm/m  RA Area:     9.63 cm LA Vol (A2C):   60.1 ml 35.20 ml/m RA Volume:   18.00 ml 10.54 ml/m LA Vol (A4C):   42.6 ml 24.95 ml/m LA Biplane Vol: 51.6 ml 30.22 ml/m  AORTIC VALVE LVOT Vmax:   95.00 cm/s LVOT Vmean:  68.300 cm/s LVOT VTI:    0.240 m  AORTA Ao Root diam: 2.60 cm Ao Asc diam:  3.00 cm MITRAL VALVE MV Area (PHT): 2.91 cm     SHUNTS MV Peak grad:  10.1 mmHg    Systemic VTI:  0.24 m MV Mean grad:  6.0 mmHg     Systemic Diam: 1.60 cm MV Vmax:       1.59 m/s MV Vmean:      121.0 cm/s MV Decel Time: 261 msec MV E velocity: 141.00 cm/s MV A velocity: 129.00 cm/s MV E/A ratio:  1.09 Armanda Magicraci Turner MD Electronically signed by Armanda Magicraci Turner MD Signature Date/Time: 10/25/2020/4:09:10 PM    Final     PHYSICAL EXAM  Temp:  [97.8 F (36.6 C)-98.9 F (37.2 C)] 97.8 F (36.6 C) (12/16 1600) Pulse Rate:  [65-95] 93 (12/16 1500) Resp:  [11-20] 15 (12/16 1500) BP: (110-162)/(65-114) 119/71 (12/16 1500) SpO2:  [95 %-100 %] 100 % (12/16 1500)  General - Well nourished, well developed, in no apparent distress.  Ophthalmologic - fundi not visualized due to noncooperation.  Cardiovascular - Regular rhythm and rate.  Mental Status -  Level of arousal and orientation to time, place, and person were intact. Language including expression, naming, repetition, comprehension was assessed and found intact.  Moderate dysarthria Fund of Knowledge was assessed and was intact.  Cranial Nerves II - XII - II - Visual  field intact OU. III, IV, VI - Extraocular movements intact. V - Facial sensation intact bilaterally. VII - right facial droop VIII - Hearing & vestibular intact bilaterally. X - Palate elevates symmetrically.  Moderate  dysarthria XI - Chin turning & shoulder shrug intact bilaterally. XII - Tongue protrusion intact.  Motor Strength - The patient's strength was normal in left upper and lower extremities, however, right extremity flaccid, right lower extremity proximal 3-/5, knee flexion 3/5, ankle DF/PF 0/5.Marland Kitchen  Bulk was normal and fasciculations were absent.   Motor Tone - Muscle tone was assessed at the neck and appendages and was normal.  Reflexes - The patient's reflexes were decreased on the right upper and lower extremity and she had no pathological reflexes.  Sensory - Light touch, temperature/pinprick were assessed and were decreased on the right lower extremity.    Coordination - The patient had normal movements in the left hand with no ataxia or dysmetria.  Tremor was absent.  Gait and Station - deferred.   ASSESSMENT/PLAN Ms. ALANTRA POPOCA is a 59 y.o. female with history of bilateral carotid stenosis, prior right carotid stent due to stroke presenting with left-sided weakness, ongoing tobacco abuse, sensorineural hearing loss bilateral ears presenting with a fall from R sided weakness. Received IV tPA 10/25/2020 at 1044 per wake-up stroke criteria w/ DWI mismatch. Emesis post tPA w/ neuro worsening. CT neg for hemorrhage.    Stroke:   L PLIC infarct s/p tPA secondary to large vs. small vessel disease source  Code Stroke CT head No acute abnormality. ASPECTS 10.     MRI  L PLIC minimal FLAIR intensity  MRA  Motion degraded  Repeat CT head w/ neuro worsening No acute abnormality. Sinus dz.  MRI  L thalamocapsular jxn infarct slightly larger than earlier MRI  CTA head & neck left CCA proximal high-grade stenosis 60%, right ICA stent patent  2D Echo EF 60-65%. No source of embolus. Moderate grade III transverse aortic plaque.  LDL 213  HgbA1c 4.9  UDS neg  VTE prophylaxis - SCDs   Aspirin 325 mg daily prior to admission, now on aspirin 325 mg daily and clopidogrel 75 mg daily after  Plavix load.  Therapy recommendations: CIR  Disposition:  pending   Hx stroke/TIA  09/2018 - dysarthria, L facial droop, L sided weakness and sensory deficit admitted in [facility]. Treated w/ tPA.  CT head and neck showed right ICA thrombus status post R ICA stent placement.  MRI showed right MCA infarcts.  EF 60 to 65%.  A1c 4.7 LDL 106.  Discharged with DAPT and Lipitor 80.   Carotid Stenosis, bilalteral  S/p R CAS 09/2018 at [facility]  CT head and neck this time showed right carotid stent patent, however, left proximal CCA high-grade stenosis 60%  Discussed with Dr. Corliss Skains, will do cerebral angiogram in a.m.  Hyperlipidemia  Home meds:  None   Put on Lipitor 80   LDL 213, goal < 70  Continue statin at discharge  Tobacco abuse  Current smoker  Smoking cessation counseling provided  Pt is willing to quit  Other Stroke Risk Factors  Hx severe ETOH use, last nmtake 09/04/2020. alcohol level <10, advised to drink no more than 1 drink(s) a day  Substance abuse - Hx crack cocaine use   Family hx stroke (distant "someone on her mother's side)  Other Active Problems  Anxiety on buspar, sertraline, trazodone  Nausea and emesis on zofran prn  Hospital day # 1  This  patient is critically ill due to stroke status post TPA, carotid stenosis and at significant risk of neurological worsening, death form recurrent stroke, hemorrhagic version, hemorrhage from TPA. This patient's care requires constant monitoring of vital signs, hemodynamics, respiratory and cardiac monitoring, review of multiple databases, neurological assessment, discussion with family, other specialists and medical decision making of high complexity. I spent 35 minutes of neurocritical care time in the care of this patient.  Marvel Plan, MD PhD Stroke Neurology 10/26/2020 4:49 PM  To contact Stroke Continuity provider, please refer to [email or web address]. After hours, contact General Neurology

## 2020-10-26 NOTE — Consult Note (Signed)
Chief Complaint: Patient was seen in consultation today for  Chief Complaint  Patient presents with  . Fall  . Weakness    Right side     Referring Physician(s): Dr. Iver Nestle  Supervising Physician: Julieanne Cotton  Patient Status: [facility] - In-pt  History of Present Illness: Rebecca Sutton is a 59 y.o. female with a medical history significant for CAD, bilateral carotid stenosis and prior right carotid stent due to stroke. She presented to the ED 10/25/20 with right-sided weakness. CT confirmed no acute processes and she received tPA. Additional imaging obtained.  MR Brain 10/25/20: IMPRESSION: 1. Acute infarct posterior limb internal capsule on the left with minimal FLAIR signal intensity. These results were called by telephone at the time of interpretation on 10/25/2020 at 10:40 Am to provider [facility] , who verbally acknowledged these results. 2. Motion degraded MRA.  No large vessel occlusion.  MR Brain 10/26/20: IMPRESSION: 1. 17 mm acute infarct within the left thalamocapsular junction, minimally increased in size as compared to the brain MRI performed earlier today. 2. Background mild cerebral atrophy and chronic small vessel ischemic disease. 3. Right maxillary sinusitis.  CT Angio Head/Neck 10/26/20 IMPRESSION: 1. Negative for intracranial large vessel occlusion or flow limiting stenosis. 2. 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 3. Severe atherosclerotic disease in the neck with extensive arterial calcification diffusely. 4. Right carotid stenting patent. 5. 60% diameter stenosis proximal left common carotid artery. Left internal carotid artery patent without significant stenosis. Occlusion or high-grade stenosis proximal left external carotid artery. 6. 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  Interventional Radiology has been asked to evaluate this patient for an  image-guided diagnostic cerebral angiogram for further work up. This case has been reviewed and procedure approved by Dr. Corliss Skains.   Past Medical History:  Diagnosis Date  . CAD (coronary artery disease)   . Carotid stenosis    bilateral  . CVA (cerebral vascular accident) (HCC) 2019  . H/O ETOH abuse 08/2020  . Smoking trying to quit    1/2 PPD  . Substance abuse (HCC)    last used 20 years ago    Allergies: Shellfish allergy  Medications: Prior to Admission medications   Medication Sig Start Date End Date Taking? Authorizing Provider  aspirin-acetaminophen-caffeine (EXCEDRIN MIGRAINE) [phone] MG tablet Take 2 tablets by mouth every 6 (six) hours as needed for headache.   Yes [provider]  busPIRone (BUSPAR) 10 MG tablet Take 10 mg by mouth 2 (two) times daily. 03/02/18  Yes [provider]  sertraline (ZOLOFT) 100 MG tablet Take 100 mg by mouth daily. 05/10/20  Yes [provider]  traZODone (DESYREL) 150 MG tablet Take 75 mg by mouth at bedtime. 08/01/20  Yes [provider]     No family history on file.  Social History   Socioeconomic History  . Marital status: Unknown    Spouse name: Not on file  . Number of children: Not on file  . Years of education: Not on file  . Highest education level: Not on file  Occupational History  . Not on file  Tobacco Use  . Smoking status: Current Every Day Smoker    Packs/day: 0.50    Types: Cigarettes  . Smokeless tobacco: Never Used  Substance and Sexual Activity  . Alcohol use: Not Currently  . Drug use: Not Currently  . Sexual activity: Not on file  Other Topics Concern  .  Not on file  Social History Narrative  . Not on file   Social Determinants of Health   Financial Resource Strain: Not on file  Food Insecurity: Not on file  Transportation Needs: Not on file  Physical Activity: Not on file  Stress: Not on file  Social Connections: Not on file    Review of Systems: A 12  point ROS discussed and pertinent positives are indicated in the HPI above.  All other systems are negative.  Review of Systems  Constitutional: Negative for appetite change and fatigue.  Respiratory: Negative for cough and shortness of breath.   Cardiovascular: Negative for chest pain and leg swelling.  Gastrointestinal: Negative for diarrhea, nausea and vomiting.  Musculoskeletal: Negative for back pain.  Neurological: Positive for facial asymmetry and speech difficulty.    Vital Signs: BP 119/71   Pulse 93   Temp 98.9 F (37.2 C) (Oral)   Resp 15   Ht  (1.6 m)   Wt 149 lb 3.2 oz (67.7 kg)   SpO2 100%   BMI 26.43 kg/m   Physical Exam Constitutional:      General: She is not in acute distress. HENT:     Mouth/Throat:     Mouth: Mucous membranes are moist.     Pharynx: Oropharynx is clear.  Cardiovascular:     Rate and Rhythm: Normal rate and regular rhythm.     Pulses: Normal pulses.     Heart sounds: Normal heart sounds.  Pulmonary:     Effort: Pulmonary effort is normal.     Breath sounds: Normal breath sounds.  Abdominal:     General: Bowel sounds are normal.     Palpations: Abdomen is soft.  Musculoskeletal:     Cervical back: Normal range of motion.  Skin:    General: Skin is warm and dry.  Neurological:     Mental Status: She is alert and oriented to person, place, and time.     Cranial Nerves: Dysarthria and facial asymmetry present.     Comments: Unable to move right side of body but has retained full sensation. She withdrew her right foot when pressure was applied to the bottom of her foot. Full mobility/sensation on the left. Right facial droop. Slight dysarthria but I was able to clearly understand her.      Imaging: CT ANGIO HEAD W OR WO CONTRAST  Result Date: 10/26/2020 CLINICAL DATA:  Stroke EXAM: CT ANGIOGRAPHY HEAD AND NECK TECHNIQUE: Multidetector CT imaging of the head and neck was performed using the standard protocol during bolus  administration of intravenous contrast. Multiplanar CT image reconstructions and MIPs were obtained to evaluate the vascular anatomy. Carotid stenosis measurements (when applicable) are obtained utilizing NASCET criteria, using the distal internal carotid diameter as the denominator. CONTRAST:  75mL OMNIPAQUE IOHEXOL 350 MG/ML SOLN COMPARISON:  MRI head 10/26/2020 and CT head 10/25/2020 FINDINGS: CT HEAD FINDINGS Brain: Acute infarct posterior limb internal capsule on the left best seen on diffusion-weighted imaging. Small hypodensity in this area. Otherwise no acute infarct, hemorrhage, mass. Ventricle size normal. Vascular: Negative for hyperdense vessel Skull: Negative Sinuses: Complete opacification right maxillary sinus which is contracted. Remaining sinuses clear. Mastoid clear. Orbits: Negative Review of the MIP images confirms the above findings CTA NECK FINDINGS Aortic arch: Atherosclerotic calcification aortic arch and proximal great vessels. Diffuse atherosclerotic disease right subclavian artery with mild stenosis. Moderate stenosis proximal left subclavian artery and left axillary artery. 60% diameter stenosis proximal left common carotid artery. Right carotid  system: Right carotid stenting across the bifurcation. Stent is widely patent. Diffuse atherosclerotic calcification throughout the right common carotid artery. Left carotid system: Atherosclerotic disease throughout the left common carotid artery with 60% diameter stenosis of the proximal left common carotid artery. Atherosclerotic disease left carotid bifurcation without significant internal carotid artery stenosis. Severe stenosis versus occlusion of the proximal left external carotid artery. Vertebral arteries: Right vertebral dominant. 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 Occlusion of the proximal left vertebral artery with reconstitution at the C6-7  level. This vessel is diffusely diseased but patent to the basilar. Skeleton: No acute skeletal abnormality.  Poor dentition. Other neck: Negative for mass or adenopathy in the neck. Upper chest: Mild apical emphysema without acute abnormality. Apical scarring bilaterally. Review of the MIP images confirms the above findings CTA HEAD FINDINGS Anterior circulation: Mild atherosclerotic calcification in the cavernous carotid bilaterally without stenosis. Anterior and middle cerebral arteries patent without stenosis or large vessel occlusion. Posterior circulation: Both vertebral arteries patent to the basilar. PICA patent bilaterally. Basilar widely patent. AICA, superior cerebellar, posterior cerebral arteries patent without stenosis or large vessel occlusion. Venous sinuses: Normal venous enhancement. Anatomic variants: None Review of the MIP images confirms the above findings IMPRESSION: 1. Negative for intracranial large vessel occlusion or flow limiting stenosis. 2. 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 3. Severe atherosclerotic disease in the neck with extensive arterial calcification diffusely. 4. Right carotid stenting patent. 5. 60% diameter stenosis proximal left common carotid artery. Left internal carotid artery patent without significant stenosis. Occlusion or high-grade stenosis proximal left external carotid artery. 6. 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 Electronically Signed   By: Marlan Palau M.D.   On: 10/26/2020 12:42   CT HEAD WO CONTRAST  Result Date: 10/25/2020 CLINICAL DATA:  Altered mental status. EXAM: CT HEAD WITHOUT CONTRAST TECHNIQUE: Contiguous axial images were obtained from the base of the skull through the vertex without intravenous contrast. COMPARISON:  Same day. FINDINGS: Brain: No evidence of acute infarction, hemorrhage, hydrocephalus, extra-axial collection or mass lesion/mass effect.  Vascular: No hyperdense vessel or unexpected calcification. Skull: Normal. Negative for fracture or focal lesion. Sinuses/Orbits: Right maxillary sinusitis is noted. Other: None. IMPRESSION: Right maxillary sinusitis. No acute intracranial abnormality seen. Electronically Signed   By: Lupita Raider M.D.   On: 10/25/2020 13:01   CT ANGIO NECK W OR WO CONTRAST  Result Date: 10/26/2020 CLINICAL DATA:  Stroke EXAM: CT ANGIOGRAPHY HEAD AND NECK TECHNIQUE: Multidetector CT imaging of the head and neck was performed using the standard protocol during bolus administration of intravenous contrast. Multiplanar CT image reconstructions and MIPs were obtained to evaluate the vascular anatomy. Carotid stenosis measurements (when applicable) are obtained utilizing NASCET criteria, using the distal internal carotid diameter as the denominator. CONTRAST:  75mL OMNIPAQUE IOHEXOL 350 MG/ML SOLN COMPARISON:  MRI head 10/26/2020 and CT head 10/25/2020 FINDINGS: CT HEAD FINDINGS Brain: Acute infarct posterior limb internal capsule on the left best seen on diffusion-weighted imaging. Small hypodensity in this area. Otherwise no acute infarct, hemorrhage, mass. Ventricle size normal. Vascular: Negative for hyperdense vessel Skull: Negative Sinuses: Complete opacification right maxillary sinus which is contracted. Remaining sinuses clear. Mastoid clear. Orbits: Negative Review of the MIP images confirms the above findings CTA NECK FINDINGS Aortic arch: Atherosclerotic calcification aortic arch and proximal great vessels. Diffuse atherosclerotic disease right subclavian artery  with mild stenosis. Moderate stenosis proximal left subclavian artery and left axillary artery. 60% diameter stenosis proximal left common carotid artery. Right carotid system: Right carotid stenting across the bifurcation. Stent is widely patent. Diffuse atherosclerotic calcification throughout the right common carotid artery. Left carotid system:  Atherosclerotic disease throughout the left common carotid artery with 60% diameter stenosis of the proximal left common carotid artery. Atherosclerotic disease left carotid bifurcation without significant internal carotid artery stenosis. Severe stenosis versus occlusion of the proximal left external carotid artery. Vertebral arteries: Right vertebral dominant. 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 Occlusion of the proximal left vertebral artery with reconstitution at the C6-7 level. This vessel is diffusely diseased but patent to the basilar. Skeleton: No acute skeletal abnormality.  Poor dentition. Other neck: Negative for mass or adenopathy in the neck. Upper chest: Mild apical emphysema without acute abnormality. Apical scarring bilaterally. Review of the MIP images confirms the above findings CTA HEAD FINDINGS Anterior circulation: Mild atherosclerotic calcification in the cavernous carotid bilaterally without stenosis. Anterior and middle cerebral arteries patent without stenosis or large vessel occlusion. Posterior circulation: Both vertebral arteries patent to the basilar. PICA patent bilaterally. Basilar widely patent. AICA, superior cerebellar, posterior cerebral arteries patent without stenosis or large vessel occlusion. Venous sinuses: Normal venous enhancement. Anatomic variants: None Review of the MIP images confirms the above findings IMPRESSION: 1. Negative for intracranial large vessel occlusion or flow limiting stenosis. 2. 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 3. Severe atherosclerotic disease in the neck with extensive arterial calcification diffusely. 4. Right carotid stenting patent. 5. 60% diameter stenosis proximal left common carotid artery. Left internal carotid artery patent without significant stenosis. Occlusion or high-grade stenosis proximal left external carotid artery.  6. 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 Electronically Signed   By: Marlan Palauharles  Clark M.D.   On: 10/26/2020 12:42   MR ANGIO HEAD WO CONTRAST  Result Date: 10/25/2020 CLINICAL DATA:  Acute stroke.  Right-sided weakness. EXAM: MRI HEAD WITHOUT CONTRAST MRA HEAD WITHOUT CONTRAST TECHNIQUE: Multiplanar, multiecho pulse sequences of the brain and surrounding structures were obtained without intravenous contrast. Angiographic images of the head were obtained using MRA technique without contrast. COMPARISON:  CT head 10/25/2020 FINDINGS: MRI HEAD FINDINGS Brain: Acute infarct in the posterior limb internal capsule on the left with restricted diffusion. This area shows minimal FLAIR signal intensity. No other acute infarct identified Ventricle size normal. No mass lesion identified. Scattered small white matter hyperintensities, mild in degree. The patient was not able to complete the study. Gradient echo imaging not performed. All sequences not performed. Vascular: Normal arterial flow no focal voids skeletal lesion Skull and upper cervical spine: Negative Sinuses/Orbits: Mucosal edema paranasal sinuses most prominent in the right maxillary sinus. Negative orbit Other: None MRA HEAD FINDINGS Image quality degraded by motion. Right vertebral artery dominant and patent to the basilar. Moderate stenosis distal left vertebral artery. Basilar widely patent. Superior cerebellar and posterior cerebral arteries patent bilaterally. Mild stenosis left P3 segment. Right posterior cerebral artery widely patent. Atherosclerotic irregularity in the cavernous carotid bilaterally causing mild to moderate stenosis. Anterior and middle cerebral arteries patent bilaterally without large vessel occlusion. Moderate stenosis in the right A2 segment. Mild stenosis left A2 segment. Middle cerebral arteries patent bilaterally without significant stenosis. Negative for cerebral aneurysm.  IMPRESSION: 1. Acute infarct posterior limb internal capsule on the  left with minimal FLAIR signal intensity. These results were called by telephone at the time of interpretation on 10/25/2020 at 10:40 Am to provider [facility] , who verbally acknowledged these results. 2. Motion degraded MRA.  No large vessel occlusion. Electronically Signed   By: Marlan Palau M.D.   On: 10/25/2020 11:27   MR BRAIN WO CONTRAST  Result Date: 10/26/2020 CLINICAL DATA:  Stroke, follow-up; status post tPA for ischemic stroke. Right facial droop and right-sided weakness. EXAM: MRI HEAD WITHOUT CONTRAST TECHNIQUE: Multiplanar, multiecho pulse sequences of the brain and surrounding structures were obtained without intravenous contrast. COMPARISON:  MRI/MRA head 10/25/2020, head CT 10/25/2020. FINDINGS: Brain: Mild intermittent motion degradation. Mild cerebral atrophy. An acute infarct within the left thalamocapsular junction has slightly increased in size as compared to the brain MRI performed earlier today, now measuring 17 mm (previously 15 mm). Corresponding T2/FLAIR hyperintensity at this site. Background mild multifocal T2/FLAIR hyperintensity within the cerebral white matter and pons is nonspecific, but compatible with chronic small vessel ischemic disease. No evidence of intracranial mass. No chronic intracranial blood products. No extra-axial fluid collection. No midline shift. Vascular: Expected proximal arterial flow voids. Skull and upper cervical spine: No focal marrow lesion. Sinuses/Orbits: Visualized orbits show no acute finding. Complete T2 hyperintense opacification of the right maxillary sinus. Mild ethmoid sinus mucosal thickening. IMPRESSION: 17 mm acute infarct within the left thalamocapsular junction, minimally increased in size as compared to the brain MRI performed earlier today. Background mild cerebral atrophy and chronic small vessel ischemic disease. Right maxillary sinusitis. Electronically Signed    By: Jackey Loge DO   On: 10/26/2020 11:21   MR BRAIN WO CONTRAST  Result Date: 10/25/2020 CLINICAL DATA:  Acute stroke.  Right-sided weakness. EXAM: MRI HEAD WITHOUT CONTRAST MRA HEAD WITHOUT CONTRAST TECHNIQUE: Multiplanar, multiecho pulse sequences of the brain and surrounding structures were obtained without intravenous contrast. Angiographic images of the head were obtained using MRA technique without contrast. COMPARISON:  CT head 10/25/2020 FINDINGS: MRI HEAD FINDINGS Brain: Acute infarct in the posterior limb internal capsule on the left with restricted diffusion. This area shows minimal FLAIR signal intensity. No other acute infarct identified Ventricle size normal. No mass lesion identified. Scattered small white matter hyperintensities, mild in degree. The patient was not able to complete the study. Gradient echo imaging not performed. All sequences not performed. Vascular: Normal arterial flow no focal voids skeletal lesion Skull and upper cervical spine: Negative Sinuses/Orbits: Mucosal edema paranasal sinuses most prominent in the right maxillary sinus. Negative orbit Other: None MRA HEAD FINDINGS Image quality degraded by motion. Right vertebral artery dominant and patent to the basilar. Moderate stenosis distal left vertebral artery. Basilar widely patent. Superior cerebellar and posterior cerebral arteries patent bilaterally. Mild stenosis left P3 segment. Right posterior cerebral artery widely patent. Atherosclerotic irregularity in the cavernous carotid bilaterally causing mild to moderate stenosis. Anterior and middle cerebral arteries patent bilaterally without large vessel occlusion. Moderate stenosis in the right A2 segment. Mild stenosis left A2 segment. Middle cerebral arteries patent bilaterally without significant stenosis. Negative for cerebral aneurysm. IMPRESSION: 1. Acute infarct posterior limb internal capsule on the left with minimal FLAIR signal intensity. These results were  called by telephone at the time of interpretation on 10/25/2020 at 10:40 Am to provider [facility] , who verbally acknowledged these results. 2. Motion degraded MRA.  No large vessel occlusion. Electronically Signed   By: Marlan Palau M.D.   On: 10/25/2020 11:27   ECHOCARDIOGRAM COMPLETE  Result  Date: 10/25/2020    ECHOCARDIOGRAM REPORT   Patient Name:   Rebecca Sutton Date of Exam: 10/25/2020 Medical Rec #:  [id number]     Height:       63.0 in Accession #:    [id number]    Weight:       149.2 lb Date of Birth:  Apr 15, 1961     BSA:          1.707 m Patient Age:    59 years      BP:           100/62 mmHg Patient Gender: F             HR:           73 bpm. Exam Location:  Inpatient Procedure: 2D Echo, Color Doppler and Cardiac Doppler Indications:    Stroke i163.9  History:        Patient has prior history of Echocardiogram examinations, most                 recent 09/24/2018. Prior echo performed at [facility].  Sonographer:    Irving Burton Senior RDCS Referring Phys: [id number] Kara Mead IMPRESSIONS  1. Left ventricular ejection fraction, by estimation, is 60 to 65%. The left ventricle has normal function. The left ventricle has no regional wall motion abnormalities. Left ventricular diastolic parameters are indeterminate. Elevated left ventricular end-diastolic pressure.  2. Right ventricular systolic function is normal. The right ventricular size is normal. Tricuspid regurgitation signal is inadequate for assessing PA pressure.  3. The mitral valve is normal in structure. Trivial mitral valve regurgitation. No evidence of mitral stenosis.  4. The aortic valve is tricuspid. Aortic valve regurgitation is not visualized. Mild aortic valve sclerosis is present, with no evidence of aortic valve stenosis.  5. There is Moderate (Grade III) layered plaque involving the transverse aorta.  6. The inferior vena cava is normal in size with greater than 50% respiratory variability, suggesting right atrial pressure of 3 mmHg.  Consider Chest CT to evaluate aorta for aortic atherosclerosis. FINDINGS  Left Ventricle: Left ventricular ejection fraction, by estimation, is 60 to 65%. The left ventricle has normal function. The left ventricle has no regional wall motion abnormalities. The left ventricular internal cavity size was normal in size. There is  no left ventricular hypertrophy. Left ventricular diastolic parameters are indeterminate. Elevated left ventricular end-diastolic pressure. Right Ventricle: The right ventricular size is normal. No increase in right ventricular wall thickness. Right ventricular systolic function is normal. Tricuspid regurgitation signal is inadequate for assessing PA pressure. Left Atrium: Left atrial size was normal in size. Right Atrium: Right atrial size was normal in size. Pericardium: There is no evidence of pericardial effusion. Mitral Valve: The mitral valve is normal in structure. There is mild thickening of the mitral valve leaflet(s). There is mild calcification of the mitral valve leaflet(s). Trivial mitral valve regurgitation. No evidence of mitral valve stenosis. MV peak gradient, 10.1 mmHg. The mean mitral valve gradient is 6.0 mmHg. Tricuspid Valve: The tricuspid valve is normal in structure. Tricuspid valve regurgitation is not demonstrated. No evidence of tricuspid stenosis. Aortic Valve: The aortic valve is tricuspid. Aortic valve regurgitation is not visualized. Mild aortic valve sclerosis is present, with no evidence of aortic valve stenosis. Pulmonic Valve: The pulmonic valve was normal in structure. Pulmonic valve regurgitation is not visualized. No evidence of pulmonic stenosis. Aorta: The aortic root is normal in size and structure. There is moderate (Grade III) layered plaque  involving the transverse aorta. Venous: The inferior vena cava is normal in size with greater than 50% respiratory variability, suggesting right atrial pressure of 3 mmHg. IAS/Shunts: No atrial level shunt  detected by color flow Doppler.  LEFT VENTRICLE PLAX 2D LVIDd:         4.00 cm  Diastology LVIDs:         2.60 cm  LV e' medial:    3.92 cm/s LV PW:         1.20 cm  LV E/e' medial:  36.0 LV IVS:        1.00 cm  LV e' lateral:   6.42 cm/s LVOT diam:     1.60 cm  LV E/e' lateral: 22.0 LV SV:         48 LV SV Index:   28 LVOT Area:     2.01 cm  RIGHT VENTRICLE RV S prime:     7.83 cm/s TAPSE (M-mode): 1.9 cm LEFT ATRIUM             Index       RIGHT ATRIUM          Index LA diam:        3.50 cm 2.05 cm/m  RA Area:     9.63 cm LA Vol (A2C):   60.1 ml 35.20 ml/m RA Volume:   18.00 ml 10.54 ml/m LA Vol (A4C):   42.6 ml 24.95 ml/m LA Biplane Vol: 51.6 ml 30.22 ml/m  AORTIC VALVE LVOT Vmax:   95.00 cm/s LVOT Vmean:  68.300 cm/s LVOT VTI:    0.240 m  AORTA Ao Root diam: 2.60 cm Ao Asc diam:  3.00 cm MITRAL VALVE MV Area (PHT): 2.91 cm     SHUNTS MV Peak grad:  10.1 mmHg    Systemic VTI:  0.24 m MV Mean grad:  6.0 mmHg     Systemic Diam: 1.60 cm MV Vmax:       1.59 m/s MV Vmean:      121.0 cm/s MV Decel Time: 261 msec MV E velocity: 141.00 cm/s MV A velocity: 129.00 cm/s MV E/A ratio:  1.09 Armanda Magic MD Electronically signed by Armanda Magic MD Signature Date/Time: 10/25/2020/4:09:10 PM    Final    CT HEAD CODE STROKE WO CONTRAST  Result Date: 10/25/2020 CLINICAL DATA:  Code stroke. EXAM: CT HEAD WITHOUT CONTRAST TECHNIQUE: Contiguous axial images were obtained from the base of the skull through the vertex without intravenous contrast. COMPARISON:  None. FINDINGS: Brain: There is no acute intracranial hemorrhage, mass effect, or edema. Gray-white differentiation is preserved. Ventricles and sulci are normal in size and configuration. There is no extra-axial collection. Vascular: No hyperdense vessel. There is intracranial atherosclerotic calcification at the skull base. Skull: Unremarkable Sinuses/Orbits: Minimally imaged right maxillary sinus is opacified. Otherwise minor paranasal sinus mucosal thickening.  Orbits are unremarkable. Other: Mastoid air cells are clear. ASPECTS (Alberta Stroke Program Early CT Score) - Ganglionic level infarction (caudate, lentiform nuclei, internal capsule, insula, M1-M3 cortex): 7 - Supraganglionic infarction (M4-M6 cortex): 3 Total score (0-10 with 10 being normal): 10 IMPRESSION: There is no acute intracranial hemorrhage or evidence of acute infarction. ASPECT score is 10. These results were communicated to Dr. Iver Nestle at 10:06 am on 10/25/2020 by text page via the [facility] messaging system. Electronically Signed   By: Guadlupe Spanish M.D.   On: 10/25/2020 10:09    Labs:  CBC: Recent Labs    10/25/20 0941 10/25/20 0948  WBC 6.0  --  HGB 12.9 13.3  HCT 37.6 39.0  PLT 263  --     COAGS: Recent Labs    10/25/20 0941  INR 1.0  APTT 28    BMP: Recent Labs    10/25/20 0941 10/25/20 0948  NA 137 139  K 4.4 4.1  CL 104 106  CO2 21*  --   GLUCOSE 107* 108*  BUN 15 16  CALCIUM 9.6  --   CREATININE 0.96 0.90  GFRNONAA >60  --     LIVER FUNCTION TESTS: Recent Labs    10/25/20 0941  BILITOT 0.5  AST 24  ALT 13  ALKPHOS 65  PROT 6.8  ALBUMIN 3.6    TUMOR MARKERS: No results for input(s): AFPTM, CEA, CA199, CHROMGRNA in the last 8760 hours.  Assessment and Plan:  Acute infarct posterior limb internal capsule, s/p tPA 10/25/20: Rebecca Sutton, 58 year old female, is tentatively scheduled for a diagnostic cerebral angiogram 10/27/20 at the [facility] Neuro Interventional Radiology department.   Risks and benefits of this procedure were discussed with the patient including, but not limited to bleeding, infection, vascular injury or contrast induced renal failure.  This interventional procedure involves the use of X-rays and because of the nature of the planned procedure, it is possible that we will have prolonged use of X-ray fluoroscopy.  Potential radiation risks to you include (but are not limited to) the following: - A slightly elevated  risk for cancer  several years later in life. This risk is typically less than 0.5% percent. This risk is low in comparison to the normal incidence of human cancer, which is 33% for women and 50% for men according to the [employer]. - Radiation induced injury can include skin redness, resembling a rash, tissue breakdown / ulcers and hair loss (which can be temporary or permanent).   The likelihood of either of these occurring depends on the difficulty of the procedure and whether you are sensitive to radiation due to previous procedures, disease, or genetic conditions.   IF your procedure requires a prolonged use of radiation, you will be notified and given written instructions for further action.  It is your responsibility to monitor the irradiated area for the 2 weeks following the procedure and to notify your physician if you are concerned that you have suffered a radiation induced injury.    All of the patient's questions were answered, patient is agreeable to proceed.  The patient will be NPO after midnight. AM labs will be ordered.   Consent signed and in chart.  Thank you for this interesting consult.  I greatly enjoyed meeting Rebecca Sutton and look forward to participating in their care.  A copy of this report was sent to the requesting provider on this date.  Electronically Signed: Alwyn Ren, AGACNP-BC [phone] 10/26/2020, 3:47 PM   I spent a total of 20 Minutes    in face to face in clinical consultation, greater than 50% of which was counseling/coordinating care for diagnostic cerebral angiogram.

## 2020-10-26 NOTE — Evaluation (Signed)
Speech Language Pathology Evaluation Patient Details Name: Rebecca Sutton MRN: [id number] DOB: [birth year] Today's Date: 10/26/2020 Time: 8338-2505 SLP Time Calculation (min) (ACUTE ONLY): 25 min  Problem List:  Patient Active Problem List   Diagnosis Date Noted  . Left sided lacunar infarction [facility]) 10/25/2020   Past Medical History:  Past Medical History:  Diagnosis Date  . CAD (coronary artery disease)   . Carotid stenosis    bilateral  . CVA (cerebral vascular accident) (HCC) 2019  . H/O ETOH abuse 08/2020  . Smoking trying to quit    1/2 PPD  . Substance abuse (HCC)    last used 20 years ago   HPI:  AQUANETTA SCHWARZ is a 59 y.o. female with a past medical history significant for bilateral carotid stenosis, prior right carotid stent due to stroke presenting with left-sided weakness, ongoing tobacco abuse, sensorineural hearing loss bilateral ears. MRI 12/15: "Acute infarct posterior limb internal capsule on the left with  minimal FLAIR signal intensity"  Pt s/p tPA   Assessment / Plan / Recommendation Clinical Impression  Patient presents with a mild-moderate flaccid dysarthria impacting speech intelligibility and quality of speech. Oral motor weakness and decreased ROM occuring with right side facial and lingual musculature. Cognitive-linguistic abilities all appear to be [facility] and patient is fully oriented, aware of deficits, demonstrating adequate problem solving, reasoning and safety awareness. She became emotional when thinking about her impairments but overall was not significantly labile. Paitent able to improve speech intelligiblity and clarity by following SLP in working on slowing down speech rate and speaking louder. Patient had been assessed earlier today for swallow function and is currently on a Dys 3, thin liquids diet.    SLP Assessment  SLP Recommendation/Assessment: Patient needs continued Speech Lanaguage Pathology Services SLP Visit Diagnosis: Dysarthria and  anarthria (R47.1)    Follow Up Recommendations  Inpatient Rehab    Frequency and Duration min 2x/week  1 week      SLP Evaluation Cognition  Overall Cognitive Status: Within Functional Limits for tasks assessed Arousal/Alertness: Awake/alert Orientation Level: Oriented X4 Memory: Appears intact Awareness: Appears intact Problem Solving: Appears intact Safety/Judgment: Appears intact       Comprehension  Auditory Comprehension Overall Auditory Comprehension: Appears within functional limits for tasks assessed    Expression Expression Primary Mode of Expression: Verbal Verbal Expression Overall Verbal Expression: Appears within functional limits for tasks assessed Initiation: No impairment Repetition: No impairment Naming: No impairment Written Expression Dominant Hand: Right Written Expression: Not tested   Oral / Motor  Oral Motor/Sensory Function Overall Oral Motor/Sensory Function: Moderate impairment Facial ROM: Reduced right Facial Symmetry: Abnormal symmetry right Facial Strength: Reduced right Facial Sensation: Reduced right Lingual ROM: Within Functional Limits Lingual Symmetry: Abnormal symmetry right Lingual Strength: Within Functional Limits Lingual Sensation: Reduced Velum: Within Functional Limits Mandible: Within Functional Limits Motor Speech Overall Motor Speech: Impaired Respiration: Within functional limits Resonance: Within functional limits Articulation: Impaired Level of Impairment: Conversation Intelligibility: Intelligibility reduced Word: 75-100% accurate Phrase: 50-74% accurate Sentence: 50-74% accurate Conversation: 50-74% accurate Motor Planning: Witnin functional limits Motor Speech Errors: Not applicable Effective Techniques: Slow rate;Over-articulate   GO                   Angela Nevin, MA, CCC-SLP Speech Therapy [facility] Acute Rehab

## 2020-10-27 ENCOUNTER — Encounter (HOSPITAL_COMMUNITY): Payer: Self-pay | Admitting: Neurology

## 2020-10-27 ENCOUNTER — Inpatient Hospital Stay (HOSPITAL_COMMUNITY): Payer: Medicaid Other

## 2020-10-27 DIAGNOSIS — I6381 Other cerebral infarction due to occlusion or stenosis of small artery: Principal | ICD-10-CM

## 2020-10-27 DIAGNOSIS — I2510 Atherosclerotic heart disease of native coronary artery without angina pectoris: Secondary | ICD-10-CM

## 2020-10-27 DIAGNOSIS — H905 Unspecified sensorineural hearing loss: Secondary | ICD-10-CM

## 2020-10-27 DIAGNOSIS — R739 Hyperglycemia, unspecified: Secondary | ICD-10-CM

## 2020-10-27 DIAGNOSIS — F1010 Alcohol abuse, uncomplicated: Secondary | ICD-10-CM

## 2020-10-27 DIAGNOSIS — Z8673 Personal history of transient ischemic attack (TIA), and cerebral infarction without residual deficits: Secondary | ICD-10-CM

## 2020-10-27 DIAGNOSIS — G8191 Hemiplegia, unspecified affecting right dominant side: Secondary | ICD-10-CM

## 2020-10-27 HISTORY — PX: IR ANGIO INTRA EXTRACRAN SEL COM CAROTID INNOMINATE BILAT MOD SED: IMG5360

## 2020-10-27 HISTORY — PX: IR ANGIO VERTEBRAL SEL SUBCLAVIAN INNOMINATE BILAT MOD SED: IMG5366

## 2020-10-27 LAB — GLUCOSE, CAPILLARY
Glucose-Capillary: 102 mg/dL — ABNORMAL HIGH (ref 70–99)
Glucose-Capillary: 110 mg/dL — ABNORMAL HIGH (ref 70–99)
Glucose-Capillary: 100 mg/dL — ABNORMAL HIGH (ref 70–99)
Glucose-Capillary: 127 mg/dL — ABNORMAL HIGH (ref 70–99)
Glucose-Capillary: 97 mg/dL (ref 70–99)

## 2020-10-27 LAB — CBC
HCT: 36.9 % (ref 36.0–46.0)
Hemoglobin: 12.5 g/dL (ref 12.0–15.0)
MCH: 32.1 pg (ref 26.0–34.0)
MCHC: 33.9 g/dL (ref 30.0–36.0)
MCV: 94.9 fL (ref 80.0–100.0)
Platelets: 258 10*3/uL (ref 150–400)
RBC: 3.89 MIL/uL (ref 3.87–5.11)
RDW: 12.1 % (ref 11.5–15.5)
WBC: 6.3 10*3/uL (ref 4.0–10.5)
nRBC: 0 % (ref 0.0–0.2)

## 2020-10-27 LAB — BASIC METABOLIC PANEL
Anion gap: 9 (ref 5–15)
BUN: 8 mg/dL (ref 6–20)
CO2: 25 mmol/L (ref 22–32)
Calcium: 9.6 mg/dL (ref 8.9–10.3)
Chloride: 104 mmol/L (ref 98–111)
Creatinine, Ser: 0.91 mg/dL (ref 0.44–1.00)
GFR, Estimated: >60 mL/min (ref >60)
Glucose, Bld: 97 mg/dL (ref 70–99)
Potassium: 3.5 mmol/L (ref 3.5–5.1)
Sodium: 138 mmol/L (ref 135–145)

## 2020-10-27 LAB — PROTIME-INR
INR: 1.1 (ref 0.8–1.2)
Prothrombin Time: 13.5 seconds (ref 11.4–15.2)

## 2020-10-27 MED ORDER — LIDOCAINE HCL 1 % IJ SOLN
INTRAMUSCULAR | Status: AC | PRN
  Administered 2020-10-27: 10 mL

## 2020-10-27 MED ORDER — FENTANYL CITRATE (PF) 100 MCG/2ML IJ SOLN
INTRAMUSCULAR | Status: AC | PRN
  Administered 2020-10-27: 25 ug via INTRAVENOUS
  Administered 2020-10-27 (×2): 12.5 ug via INTRAVENOUS

## 2020-10-27 MED ORDER — VERAPAMIL HCL 2.5 MG/ML IV SOLN
INTRAVENOUS | Status: AC
  Filled 2020-10-27: qty 2

## 2020-10-27 MED ORDER — DIPHENHYDRAMINE HCL 50 MG/ML IJ SOLN
INTRAMUSCULAR | Status: AC | PRN
Start: 2020-10-27 — End: 2020-10-27
  Administered 2020-10-27: 25 mg via INTRAVENOUS

## 2020-10-27 MED ORDER — ATORVASTATIN CALCIUM 80 MG PO TABS
80.0000 mg | ORAL_TABLET | Freq: Every day | ORAL | Status: DC
  Administered 2020-10-27 – 2020-11-01 (×6): 80 mg via ORAL
  Filled 2020-10-27 (×6): qty 1

## 2020-10-27 MED ORDER — FENTANYL CITRATE (PF) 100 MCG/2ML IJ SOLN
INTRAMUSCULAR | Status: AC
  Filled 2020-10-27: qty 2

## 2020-10-27 MED ORDER — IOHEXOL 300 MG/ML  SOLN: 150.0000 mL | Freq: Once | INTRAMUSCULAR | Status: DC | PRN

## 2020-10-27 MED ORDER — HEPARIN SODIUM (PORCINE) 1000 UNIT/ML IJ SOLN
INTRAMUSCULAR | Status: AC
  Filled 2020-10-27: qty 1

## 2020-10-27 MED ORDER — LIDOCAINE HCL 1 % IJ SOLN
INTRAMUSCULAR | Status: AC
  Filled 2020-10-27: qty 20

## 2020-10-27 MED ORDER — DIPHENHYDRAMINE HCL 50 MG/ML IJ SOLN
INTRAMUSCULAR | Status: AC
  Filled 2020-10-27: qty 1

## 2020-10-27 MED ORDER — HEPARIN SODIUM (PORCINE) 1000 UNIT/ML IJ SOLN
INTRAMUSCULAR | Status: AC | PRN
  Administered 2020-10-27: 1000 [IU] via INTRAVENOUS

## 2020-10-27 MED ORDER — MIDAZOLAM HCL 2 MG/2ML IJ SOLN
INTRAMUSCULAR | Status: AC
  Filled 2020-10-27: qty 2

## 2020-10-27 MED ORDER — SODIUM CHLORIDE 0.9 % IV SOLN: INTRAVENOUS | Status: AC

## 2020-10-27 NOTE — Progress Notes (Signed)
STROKE TEAM PROGRESS NOTE   INTERVAL HISTORY No family at bedside.  Patient had cerebral angiogram today with Dr. Corliss Skains, redemonstrated left CCA proximal 50 to 60% stenosis.  Plan to have left CCA stenting next Tuesday.  However, if CIR placement will before Tuesday, procedure can be postponed at the discharge of CIR. patient is agreeable to procedure.  Vitals:   10/26/20 2100 10/26/20 2200 10/26/20 2222 10/27/20 0448  BP: 139/72 121/67 (!) 151/65 129/72  Pulse: 68 65 68 78  Resp: 11 (!) 25 14 19   Temp: 98.2 F (36.8 C)  97.9 F (36.6 C) 98.4 F (36.9 C)  TempSrc:   Oral Oral  SpO2: 97% 97% 99% 100%  Weight:      Height:       CBC:  Recent Labs  Lab 10/25/20 0941 10/25/20 0948  WBC 6.0  --   NEUTROABS 3.8  --   HGB 12.9 13.3  HCT 37.6 39.0  MCV 96.4  --   PLT 263  --    Basic Metabolic Panel:  Recent Labs  Lab 10/25/20 0941 10/25/20 0948  NA 137 139  K 4.4 4.1  CL 104 106  CO2 21*  --   GLUCOSE 107* 108*  BUN 15 16  CREATININE 0.96 0.90  CALCIUM 9.6  --   MG 2.1  --   PHOS 3.7  --    Lipid Panel:  Recent Labs  Lab 10/25/20 0941  CHOL 288*  TRIG 70  HDL 61  CHOLHDL 4.7  VLDL 14  LDLCALC 10/27/20*   HgbA1c:  Recent Labs  Lab 10/25/20 1113  HGBA1C 4.9   Urine Drug Screen:  Recent Labs  Lab 10/25/20 2033  LABOPIA NONE DETECTED  COCAINSCRNUR NONE DETECTED  LABBENZ NONE DETECTED  AMPHETMU NONE DETECTED  THCU NONE DETECTED  LABBARB NONE DETECTED    Alcohol Level  Recent Labs  Lab 10/25/20 0941  ETH <10    IMAGING past 24 hours CT ANGIO HEAD W OR WO CONTRAST  Result Date: 10/26/2020 CLINICAL DATA:  Stroke EXAM: CT ANGIOGRAPHY HEAD AND NECK TECHNIQUE: Multidetector CT imaging of the head and neck was performed using the standard protocol during bolus administration of intravenous contrast. Multiplanar CT image reconstructions and MIPs were obtained to evaluate the vascular anatomy. Carotid stenosis measurements (when applicable) are obtained  utilizing NASCET criteria, using the distal internal carotid diameter as the denominator. CONTRAST:  40mL OMNIPAQUE IOHEXOL 350 MG/ML SOLN COMPARISON:  MRI head 10/26/2020 and CT head 10/25/2020 FINDINGS: CT HEAD FINDINGS Brain: Acute infarct posterior limb internal capsule on the left best seen on diffusion-weighted imaging. Small hypodensity in this area. Otherwise no acute infarct, hemorrhage, mass. Ventricle size normal. Vascular: Negative for hyperdense vessel Skull: Negative Sinuses: Complete opacification right maxillary sinus which is contracted. Remaining sinuses clear. Mastoid clear. Orbits: Negative Review of the MIP images confirms the above findings CTA NECK FINDINGS Aortic arch: Atherosclerotic calcification aortic arch and proximal great vessels. Diffuse atherosclerotic disease right subclavian artery with mild stenosis. Moderate stenosis proximal left subclavian artery and left axillary artery. 60% diameter stenosis proximal left common carotid artery. Right carotid system: Right carotid stenting across the bifurcation. Stent is widely patent. Diffuse atherosclerotic calcification throughout the right common carotid artery. Left carotid system: Atherosclerotic disease throughout the left common carotid artery with 60% diameter stenosis of the proximal left common carotid artery. Atherosclerotic disease left carotid bifurcation without significant internal carotid artery stenosis. Severe stenosis versus occlusion of the proximal left external carotid artery.  Vertebral arteries: Right vertebral dominant. 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 Occlusion of the proximal left vertebral artery with reconstitution at the C6-7 level. This vessel is diffusely diseased but patent to the basilar. Skeleton: No acute skeletal abnormality.  Poor dentition. Other neck: Negative for mass or adenopathy in the neck. Upper chest:  Mild apical emphysema without acute abnormality. Apical scarring bilaterally. Review of the MIP images confirms the above findings CTA HEAD FINDINGS Anterior circulation: Mild atherosclerotic calcification in the cavernous carotid bilaterally without stenosis. Anterior and middle cerebral arteries patent without stenosis or large vessel occlusion. Posterior circulation: Both vertebral arteries patent to the basilar. PICA patent bilaterally. Basilar widely patent. AICA, superior cerebellar, posterior cerebral arteries patent without stenosis or large vessel occlusion. Venous sinuses: Normal venous enhancement. Anatomic variants: None Review of the MIP images confirms the above findings IMPRESSION: 1. Negative for intracranial large vessel occlusion or flow limiting stenosis. 2. 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 3. Severe atherosclerotic disease in the neck with extensive arterial calcification diffusely. 4. Right carotid stenting patent. 5. 60% diameter stenosis proximal left common carotid artery. Left internal carotid artery patent without significant stenosis. Occlusion or high-grade stenosis proximal left external carotid artery. 6. 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 Electronically Signed   By: Marlan Palau M.D.   On: 10/26/2020 12:42   CT ANGIO NECK W OR WO CONTRAST  Result Date: 10/26/2020 CLINICAL DATA:  Stroke EXAM: CT ANGIOGRAPHY HEAD AND NECK TECHNIQUE: Multidetector CT imaging of the head and neck was performed using the standard protocol during bolus administration of intravenous contrast. Multiplanar CT image reconstructions and MIPs were obtained to evaluate the vascular anatomy. Carotid stenosis measurements (when applicable) are obtained utilizing NASCET criteria, using the distal internal carotid diameter as the denominator. CONTRAST:  5mL OMNIPAQUE IOHEXOL 350 MG/ML SOLN COMPARISON:  MRI head 10/26/2020  and CT head 10/25/2020 FINDINGS: CT HEAD FINDINGS Brain: Acute infarct posterior limb internal capsule on the left best seen on diffusion-weighted imaging. Small hypodensity in this area. Otherwise no acute infarct, hemorrhage, mass. Ventricle size normal. Vascular: Negative for hyperdense vessel Skull: Negative Sinuses: Complete opacification right maxillary sinus which is contracted. Remaining sinuses clear. Mastoid clear. Orbits: Negative Review of the MIP images confirms the above findings CTA NECK FINDINGS Aortic arch: Atherosclerotic calcification aortic arch and proximal great vessels. Diffuse atherosclerotic disease right subclavian artery with mild stenosis. Moderate stenosis proximal left subclavian artery and left axillary artery. 60% diameter stenosis proximal left common carotid artery. Right carotid system: Right carotid stenting across the bifurcation. Stent is widely patent. Diffuse atherosclerotic calcification throughout the right common carotid artery. Left carotid system: Atherosclerotic disease throughout the left common carotid artery with 60% diameter stenosis of the proximal left common carotid artery. Atherosclerotic disease left carotid bifurcation without significant internal carotid artery stenosis. Severe stenosis versus occlusion of the proximal left external carotid artery. Vertebral arteries: Right vertebral dominant. 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 Occlusion of the proximal left vertebral artery with reconstitution at the C6-7 level. This vessel is diffusely diseased but patent to the basilar. Skeleton: No acute skeletal abnormality.  Poor dentition. Other neck: Negative for mass or adenopathy in the neck. Upper chest: Mild apical emphysema without acute abnormality. Apical scarring bilaterally. Review of the MIP images  confirms the above findings CTA HEAD FINDINGS Anterior circulation: Mild  atherosclerotic calcification in the cavernous carotid bilaterally without stenosis. Anterior and middle cerebral arteries patent without stenosis or large vessel occlusion. Posterior circulation: Both vertebral arteries patent to the basilar. PICA patent bilaterally. Basilar widely patent. AICA, superior cerebellar, posterior cerebral arteries patent without stenosis or large vessel occlusion. Venous sinuses: Normal venous enhancement. Anatomic variants: None Review of the MIP images confirms the above findings IMPRESSION: 1. Negative for intracranial large vessel occlusion or flow limiting stenosis. 2. 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 3. Severe atherosclerotic disease in the neck with extensive arterial calcification diffusely. 4. Right carotid stenting patent. 5. 60% diameter stenosis proximal left common carotid artery. Left internal carotid artery patent without significant stenosis. Occlusion or high-grade stenosis proximal left external carotid artery. 6. 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 Electronically Signed   By: Marlan Palauharles  Clark M.D.   On: 10/26/2020 12:42   MR BRAIN WO CONTRAST  Result Date: 10/26/2020 CLINICAL DATA:  Stroke, follow-up; status post tPA for ischemic stroke. Right facial droop and right-sided weakness. EXAM: MRI HEAD WITHOUT CONTRAST TECHNIQUE: Multiplanar, multiecho pulse sequences of the brain and surrounding structures were obtained without intravenous contrast. COMPARISON:  MRI/MRA head 10/25/2020, head CT 10/25/2020. FINDINGS: Brain: Mild intermittent motion degradation. Mild cerebral atrophy. An acute infarct within the left thalamocapsular junction has slightly increased in size as compared to the brain MRI performed earlier today, now measuring 17 mm (previously 15 mm). Corresponding T2/FLAIR hyperintensity at this site. Background mild multifocal T2/FLAIR hyperintensity within the  cerebral white matter and pons is nonspecific, but compatible with chronic small vessel ischemic disease. No evidence of intracranial mass. No chronic intracranial blood products. No extra-axial fluid collection. No midline shift. Vascular: Expected proximal arterial flow voids. Skull and upper cervical spine: No focal marrow lesion. Sinuses/Orbits: Visualized orbits show no acute finding. Complete T2 hyperintense opacification of the right maxillary sinus. Mild ethmoid sinus mucosal thickening. IMPRESSION: 17 mm acute infarct within the left thalamocapsular junction, minimally increased in size as compared to the brain MRI performed earlier today. Background mild cerebral atrophy and chronic small vessel ischemic disease. Right maxillary sinusitis. Electronically Signed   By: Jackey LogeKyle  Golden DO   On: 10/26/2020 11:21    PHYSICAL EXAM  Temp:  [97.8 F (36.6 C)-98.9 F (37.2 C)] 98.4 F (36.9 C) (12/17 0448) Pulse Rate:  [64-93] 78 (12/17 0448) Resp:  [11-25] 19 (12/17 0448) BP: (119-166)/(65-114) 129/72 (12/17 0448) SpO2:  [97 %-100 %] 100 % (12/17 0448)  General - Well nourished, well developed, in no apparent distress.  Ophthalmologic - fundi not visualized due to noncooperation.  Cardiovascular - Regular rhythm and rate.  Mental Status -  Level of arousal and orientation to time, place, and person were intact. Language including expression, naming, repetition, comprehension was assessed and found intact.  Moderate dysarthria Fund of Knowledge was assessed and was intact.  Cranial Nerves II - XII - II - Visual field intact OU. III, IV, VI - Extraocular movements intact. V - Facial sensation intact bilaterally. VII - right facial droop VIII - Hearing & vestibular intact bilaterally. X - Palate elevates symmetrically.  Moderate dysarthria XI - Chin turning & shoulder shrug intact bilaterally. XII - Tongue protrusion intact.  Motor Strength - The patient's strength was normal in left  upper and lower extremities, however, right extremity flaccid, right lower extremity proximal 3-/5, knee flexion 3/5, ankle  DF/PF 0/5.Marland Kitchen  Bulk was normal and fasciculations were absent.   Motor Tone - Muscle tone was assessed at the neck and appendages and was normal.  Reflexes - The patient's reflexes were decreased on the right upper and lower extremity and she had no pathological reflexes.  Sensory - Light touch, temperature/pinprick were assessed and were decreased on the right lower extremity.    Coordination - The patient had normal movements in the left hand with no ataxia or dysmetria.  Tremor was absent.  Gait and Station - deferred.   ASSESSMENT/PLAN [name] is a 60 y.o. female with history of bilateral carotid stenosis, prior right carotid stent due to stroke presenting with left-sided weakness, ongoing tobacco abuse, sensorineural hearing loss bilateral ears presenting with a fall from R sided weakness. Received IV tPA 10/25/2020 at 1044 per wake-up stroke criteria w/ DWI mismatch. Emesis post tPA w/ neuro worsening. CT neg for hemorrhage.    Stroke:   L PLIC infarct s/p tPA secondary to large vs. small vessel disease source  Code Stroke CT head No acute abnormality. ASPECTS 10.     MRI  L PLIC minimal FLAIR intensity  MRA  Motion degraded  Repeat CT head w/ neuro worsening No acute abnormality. Sinus dz.  MRI  L thalamocapsular jxn infarct slightly larger than earlier MRI  CTA head & neck left CCA proximal high-grade stenosis 60%, right ICA stent patent  Cerebral angiogram bilateral VA occlusion, left CCA 50 to 60% stenosis, right SCA 50%, left SCA 75% stenosis.  2D Echo EF 60-65%. No source of embolus. Moderate grade III transverse aortic plaque.  LDL 213  HgbA1c 4.9  UDS neg  VTE prophylaxis - SCDs   Aspirin 325 mg daily prior to admission, now on aspirin 325 mg daily and clopidogrel 75 mg daily after Plavix load.  Therapy recommendations:  CIR  Disposition:  pending   Hx stroke/TIA  09/2018 - dysarthria, L facial droop, L sided weakness and sensory deficit admitted in [facility]. Treated w/ tPA.  CT head and neck showed right ICA thrombus status post R ICA stent placement.  MRI showed right MCA infarcts.  EF 60 to 65%.  A1c 4.7 LDL 106.  Discharged with DAPT and Lipitor 80.   Carotid Stenosis, bilalteral  S/p R CAS 09/2018 at [facility]  CT head and neck this time showed right carotid stent patent, however, left proximal CCA high-grade stenosis 60%  Cerebral angiogram showed left CCA 50 to 60% stenosis  Plan to have left CCA stenting next Tuesday.  However, if CIR placement before Tuesday, procedure can be postponed at the discharge of CIR.   Hyperlipidemia  Home meds:  None   Put on Lipitor 80   LDL 213, goal < 70  Continue statin at discharge  Tobacco abuse  Current smoker  Smoking cessation counseling provided  Pt is willing to quit  Other Stroke Risk Factors  Hx severe ETOH use, last nmtake 09/04/2020. alcohol level <10, advised to drink no more than 1 drink(s) a day  Substance abuse - Hx crack cocaine use   Family hx stroke (distant "someone on her mother's side)  Other Active Problems  Anxiety on buspar, sertraline, trazodone  Nausea and emesis on zofran prn  Hospital day # 2  Marvel Plan, MD PhD Stroke Neurology 10/27/2020 9:36 PM  To contact Stroke Continuity provider, please refer to [email or web address]. After hours, contact General Neurology

## 2020-10-27 NOTE — Progress Notes (Signed)
Occupational Therapy Evaluation (late entry)  Pt admitted with the below listed diagnosis and demonstrates the below listed deficits.  She demonstrates Rt hemiparesis, impaired balance, decreased activity tolerance.  She requires min A - max A for ADLs and mod A +2 for functional transfers.  She lives with a roommate, and her cats, and was fully independent PTA.  Recommend CIR.    10/26/20 2000  OT Visit Information  Last OT Received On 10/27/20  Assistance Needed +2  Reason for Co-Treatment Complexity of the patient's impairments (multi-system involvement);To address functional/ADL transfers;For patient/therapist safety  OT goals addressed during session ADL's and self-care  History of Present Illness 59 y.o. female admitted to ED On 12/15 with R weakness, facial droop, and fall. 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  Precautions  Precautions Fall  Precaution Comments R hemiparesis RUE>LE  Restrictions  Weight Bearing Restrictions No  Home Living  Family/patient expects to be discharged to: Private residence  Living Arrangements Non-relatives/Friends  Available Help at Discharge Family;Available PRN/intermittently  Type of Home House  Home Access Stairs to enter  Entrance Stairs-Number of Steps 2  Entrance Stairs-Rails Can reach both  Home Layout One level  Bathroom Shower/Tub Tub/shower unit  Engineer, water - 2 wheels;BSC;Wheelchair - manual  Additional Comments has access to equipment, from late brother (passed in October)   Lives With Alone  Prior Function  Level of Independence Independent  Comments does everything for self, does not drive but has mother/daughter to take her places.  Has 4 cats  Communication   Communication No difficulties  Pain Assessment  Pain Assessment No/denies pain  Cognition  Arousal/Alertness Awake/alert  Behavior During Therapy WFL for tasks assessed/performed  Overall Cognitive Status Within Functional Limits for tasks assessed  General Comments grossly intact  Upper Extremity Assessment  Upper Extremity Assessment RUE deficits/detail  RUE Deficits / Details Pt demonstrates movement of Rt UE and hand in Brunnstrom stage 2 -flexor synergies beginning.  PROM WFL  RUE Coordination decreased fine motor;decreased gross motor  Lower Extremity Assessment  Lower Extremity Assessment Defer to PT evaluation  Cervical / Trunk Assessment  Cervical / Trunk Assessment Normal  ADL  Overall ADL's  Needs assistance/impaired  Eating/Feeding Set up;Sitting  Grooming Wash/dry hands;Wash/dry face;Oral care;Brushing hair;Minimal assistance;Sitting  Upper Body Bathing Moderate assistance;Sitting  Lower Body Bathing Maximal assistance;Sit to/from stand  Upper Body Dressing  Maximal assistance;Sitting  Lower Body Dressing Sit to/from stand;Moderate assistance  Lower Body Dressing Details (indicate cue type and reason) able to pull sock over Lt foot once it's started over toes  Toilet Transfer +2 for physical assistance;+2 for safety/equipment;Stand-pivot;Maximal assistance;BSC  Toileting- Clothing Manipulation and Hygiene Total assistance;Sit to/from stand  Functional mobility during ADLs Moderate assistance;+2 for physical assistance;+2 for safety/equipment  Vision- History  Baseline Vision/History No visual deficits  Patient Visual Report No change from baseline  Vision- Assessment  Vision Assessment? Yes  Eye Alignment WFL  Ocular Range of Motion [facility]  Alignment/Gaze Preference WDL  Tracking/Visual Pursuits Able to track stimulus in all quads without difficulty  Visual Fields No apparent deficits  Perception  Perception Tested? Yes  Praxis  Praxis tested? WFL  Bed Mobility   Overal bed mobility Needs Assistance  Bed Mobility Supine to Sit  Supine to sit Mod assist;+2 for physical  assistance;HOB elevated  General bed mobility comments Mod +2 for trunk elevation, LE translation to EOB. Pt able to scoot to EOB without PT or OT assist via lateral weight shifting  Transfers  Overall transfer level Needs assistance  Equipment used 2 person hand held assist  Transfers Sit to/from Stand;Stand Pivot Transfers  Sit to Stand Mod assist;+2 physical assistance  Stand pivot transfers Mod assist;+2 physical assistance  General transfer comment Mod +2 for power up, placing RLE on floor via tactile input to dorsum of foot as pt in foot supination, R knee blocking in stance, steadying, and step-by-step cuing for stepping to reach recliner placed to pt L.  Balance  Overall balance assessment Needs assistance  Sitting-balance support Feet supported;Single extremity supported  Sitting balance-Leahy Scale Fair  Sitting balance - Comments able to sit EOB statically without PT or OT support. Requires min posterior and R lateral trunk assist with dynamic tasks (i.e. donning socks)  Postural control Posterior lean;Right lateral lean  Standing balance support Bilateral upper extremity supported;During functional activity  Standing balance-Leahy Scale Poor  Standing balance comment reliant on external support  Exercises  Exercises Other exercises  Other Exercises  Other Exercises pt instructed in initial HEP for Rt UE with focus on developing extension - lap slides focusing in small excursion elbow flex/ext, and finger extension  OT - End of Session  Equipment Utilized During Treatment Gait belt  Activity Tolerance Patient tolerated treatment well  Patient left in chair;with call bell/phone within reach;with chair alarm set  Nurse Communication Mobility status  OT Assessment  OT Recommendation/Assessment Patient needs continued OT Services  OT Visit Diagnosis Hemiplegia and  hemiparesis  Hemiplegia - Right/Left Right  Hemiplegia - dominant/non-dominant Dominant  Hemiplegia - caused by Cerebral infarction  OT Problem List Decreased strength;Decreased range of motion;Decreased activity tolerance;Impaired balance (sitting and/or standing);Decreased coordination;Decreased safety awareness;Impaired tone;Impaired UE functional use  OT Plan  OT Frequency (ACUTE ONLY) Min 2X/week  OT Treatment/Interventions (ACUTE ONLY) Self-care/ADL training;Neuromuscular education;DME and/or AE instruction;Therapeutic activities;Patient/family education;Balance training;Manual therapy;Splinting  AM-PAC OT "6 Clicks" Daily Activity Outcome Measure (Version 2)  Help from another person eating meals? 3  Help from another person taking care of personal grooming? 3  Help from another person toileting, which includes using toliet, bedpan, or urinal? 2  Help from another person bathing (including washing, rinsing, drying)? 2  Help from another person to put on and taking off regular upper body clothing? 2  Help from another person to put on and taking off regular lower body clothing? 2  6 Click Score 14  OT Recommendation  Recommendations for Other Services Rehab consult  Follow Up Recommendations CIR  OT Equipment 3 in 1 bedside commode  Individuals Consulted  Consulted and Agree with Results and Recommendations Patient  Acute Rehab OT Goals  Patient Stated Goal to go home to take care of cats  OT Goal Formulation With patient  Time For Goal Achievement 10/27/20  Potential to Achieve Goals Good  OT Time Calculation  OT Start Time (ACUTE ONLY) 1210  OT Stop Time (ACUTE ONLY) 1233  OT Time Calculation (min) 23 min  OT General Charges  $OT Visit 1 Visit  OT Evaluation  $OT Eval Moderate Complexity 1 Mod  Written Expression  Dominant Hand Right  Eber Jones., OTR/L Acute Rehabilitation Services Pager [phone] Office [phone]

## 2020-10-27 NOTE — Consult Note (Signed)
Physical Medicine and Rehabilitation Consult   Reason for Consult:  Stroke with functional deficits  Referring Physician: Dr. Roda Shutters   HPI: Rebecca Sutton is a 59 y.o. female with history of CAD, sensorineural hearing loss, binge drinking (none for the past month), CVA who was admitted on 10/25/2020 with right facial droop, right hemiparesis, and a fall.  History taken from chart review, patient, and mother.  MRI/MRI brain showed acute left internal capsule infarct with minimal flair intensity and no LVO. CTA head/neck showed acute infarct in left internal capsule, patent right ICA stent, 60% stenosis proximal L-CCA, occlusion or high-grade stenosis proximal L-PCA, occlusion of proximal B-VA with reconstitution at C6 level.  Echocardiogram with ejection fraction of 60-65%, no wall abnormality, moderate layering plaque in transverse aorta and was negative for shunt. Patient loaded with Plavix and started on DAPT with recommendations for cerebral angio for work-up. She underwent cerebral angiogram on 10/27/2020 revealing bilateral vertebral artery occlusion PCAs, 50 to 60% stenosis proximal L-CCA, 50% stenosis R-SCA proximally and 75% stenosis L-SCA proximally.  Speech therapy evaluation revealed mild to moderate flaccid dysarthria with oral motor weakness. Swallow function intact. OT evaluation reveals deficits due to right hemiparesis with impaired balance, decreased activity tolerance, and posterior lean affecting ADLs and mobility. CIR was recommended to functional decline.  Review of Systems  Constitutional: Positive for malaise/fatigue. Negative for chills and fever.  HENT: Negative for tinnitus.   Eyes: Negative for blurred vision and double vision.  Respiratory: Negative for cough.   Cardiovascular: Negative for chest pain and palpitations.  Gastrointestinal: Negative for heartburn and nausea.  Genitourinary: Negative for dysuria and urgency.  Musculoskeletal: Positive for falls (when  she's drunk). Negative for myalgias.  Skin: Negative for itching and rash.  Neurological: Positive for speech change, focal weakness and weakness. Negative for dizziness, sensory change and headaches.  All other systems reviewed and are negative.    Past Medical History:  Diagnosis Date  . CAD (coronary artery disease)   . Carotid stenosis    bilateral  . CVA (cerebral vascular accident) (HCC) 2019  . H/O ETOH abuse 08/2020  . Smoking trying to quit    1/2 PPD  . Substance abuse (HCC)    last used 20 years ago    No past surgical history   Family History  Problem Relation Age of Onset  . Diabetes Mother   . High blood pressure Mother   . Heart attack Father   . COPD Father     Social History: Lives alone and independent PTA.  She does not drive because she does not have a licence.  She reports that she has been smoking cigarettes. She has been smoking about I PPD, 0.50 packs per day. She has never used smokeless tobacco. She reports that she binge drinks --last a month ago. She reports previous drug use.    Allergies  Allergen Reactions  . Shellfish Allergy Anaphylaxis    Medications Prior to Admission  Medication Sig Dispense Refill  . aspirin-acetaminophen-caffeine (EXCEDRIN MIGRAINE) 250-250-65 MG tablet Take 2 tablets by mouth every 6 (six) hours as needed for headache.    . busPIRone (BUSPAR) 10 MG tablet Take 10 mg by mouth 2 (two) times daily.    . sertraline (ZOLOFT) 100 MG tablet Take 100 mg by mouth daily.    . traZODone (DESYREL) 150 MG tablet Take 75 mg by mouth at bedtime.      Home: Home Living Family/patient expects to be  discharged to:: Private residence Living Arrangements: Non-relatives/Friends Available Help at Discharge: Family,Available PRN/intermittently Type of Home: House Home Access: Stairs to enter [employer] of Steps: 2 Entrance Stairs-Rails: Can reach both Home Layout: One level Bathroom Shower/Tub: Teacher, music: Standard Home Equipment: Environmental consultant - 2 wheels,Bedside commode,Wheelchair - manual Additional Comments: has access to equipment, from late brother (passed in October)  Lives With: Alone  Functional History: Prior Function Level of Independence: Independent Comments: does everything for self, does not drive but has mother/daughter to take her places.  Has 4 cats Functional Status:  Mobility: Bed Mobility Overal bed mobility: Needs Assistance Bed Mobility: Supine to Sit Supine to sit: Mod assist,+2 for physical assistance,HOB elevated General bed mobility comments: Mod +2 for trunk elevation, LE translation to EOB. Pt able to scoot to EOB without PT or OT assist via lateral weight shifting Transfers Overall transfer level: Needs assistance Equipment used: 2 person hand held assist Transfers: Sit to/from [employer] Sit to Stand: Mod assist,+2 physical assistance Stand pivot transfers: Mod assist,+2 physical assistance General transfer comment: Mod +2 for power up, placing RLE on floor via tactile input to dorsum of foot as pt in foot supination, R knee blocking in stance, steadying, and step-by-step cuing for stepping to reach recliner placed to pt L. Ambulation/Gait General Gait Details: NT    ADL: ADL Overall ADL's : Needs assistance/impaired Eating/Feeding: Set up,Sitting Grooming: Wash/dry hands,Wash/dry face,Oral care,Brushing hair,Minimal assistance,Sitting Upper Body Bathing: Moderate assistance,Sitting Lower Body Bathing: Maximal assistance,Sit to/from stand Upper Body Dressing : Maximal assistance,Sitting Lower Body Dressing: Sit to/from stand,Moderate assistance Lower Body Dressing Details (indicate cue type and reason): able to pull sock over Lt foot once it's started over toes Toilet Transfer: +2 for physical assistance,+2 for safety/equipment,Stand-pivot,Maximal assistance,BSC Toileting- Clothing Manipulation and Hygiene: Total  assistance,Sit to/from stand Functional mobility during ADLs: Moderate assistance,+2 for physical assistance,+2 for safety/equipment  Cognition: Cognition Overall Cognitive Status: Within Functional Limits for tasks assessed Arousal/Alertness: Awake/alert Orientation Level: Oriented X4 Memory: Appears intact Awareness: Appears intact Problem Solving: Appears intact Safety/Judgment: Appears intact Cognition Arousal/Alertness: Awake/alert Behavior During Therapy: WFL for tasks assessed/performed Overall Cognitive Status: Within Functional Limits for tasks assessed General Comments: grossly intact  Blood pressure (!) 145/82, pulse 69, temperature 98 F (36.7 C), temperature source Oral, resp. rate 14, height 5\' 3"  (1.6 m), weight 67.7 kg, SpO2 97 %. Physical Exam Vitals and nursing note reviewed.  Constitutional:      Appearance: Normal appearance. She is normal weight.  HENT:     Head: Normocephalic and atraumatic.     Right Ear: External ear normal.     Left Ear: External ear normal.     Nose: Nose normal.  Eyes:     General:        Right eye: No discharge.        Left eye: No discharge.     Extraocular Movements: Extraocular movements intact.  Cardiovascular:     Rate and Rhythm: Normal rate and regular rhythm.     Heart sounds: Murmur heard.    Pulmonary:     Effort: Pulmonary effort is normal. No respiratory distress.     Breath sounds: No stridor.  Abdominal:     General: Abdomen is flat. Bowel sounds are normal. There is no distension.  Musculoskeletal:     Cervical back: Normal range of motion and neck supple.     Comments: No edema or tenderness in extremities  Skin:    General: Skin  is warm and dry.  Neurological:     Mental Status: She is alert and oriented to person, place, and time.     Comments: Alert Right facial weakness Dysarthria Motor: Right upper extremity: Shoulder abduction, elbow flexion 2/5, elbow extension, wrist extension, handgrip  0/5 Right lower extremity: Hip flexion, knee extension 2-/5, ankle dorsiflexion 0/5 Right facial weakness HOH  Psychiatric:        Mood and Affect: Mood normal.        Behavior: Behavior normal.     Results for orders placed or performed during the hospital encounter of 10/25/20 (from the past 24 hour(s))  Glucose, capillary     Status: None   Collection Time: 10/26/20  9:15 PM  Result Value Ref Range   Glucose-Capillary 90 70 - 99 mg/dL  Glucose, capillary     Status: Abnormal   Collection Time: 10/26/20 10:53 PM  Result Value Ref Range   Glucose-Capillary 105 (H) 70 - 99 mg/dL  Glucose, capillary     Status: Abnormal   Collection Time: 10/27/20  4:14 AM  Result Value Ref Range   Glucose-Capillary 102 (H) 70 - 99 mg/dL   Comment 1 Notify RN    Comment 2 Document in Chart   Protime-INR     Status: None   Collection Time: 10/27/20  4:19 AM  Result Value Ref Range   Prothrombin Time 13.5 11.4 - 15.2 seconds   INR 1.1 0.8 - 1.2  Basic metabolic panel     Status: None   Collection Time: 10/27/20  4:19 AM  Result Value Ref Range   Sodium 138 135 - 145 mmol/L   Potassium 3.5 3.5 - 5.1 mmol/L   Chloride 104 98 - 111 mmol/L   CO2 25 22 - 32 mmol/L   Glucose, Bld 97 70 - 99 mg/dL   BUN 8 6 - 20 mg/dL   Creatinine, Ser 1.610.91 0.44 - 1.00 mg/dL   Calcium 9.6 8.9 - 09.610.3 mg/dL   GFR, Estimated >04>60 >54>60 mL/min   Anion gap 9 5 - 15  CBC     Status: None   Collection Time: 10/27/20  4:19 AM  Result Value Ref Range   WBC 6.3 4.0 - 10.5 K/uL   RBC 3.89 3.87 - 5.11 MIL/uL   Hemoglobin 12.5 12.0 - 15.0 g/dL   HCT 09.836.9 11.936.0 - 14.746.0 %   MCV 94.9 80.0 - 100.0 fL   MCH 32.1 26.0 - 34.0 pg   MCHC 33.9 30.0 - 36.0 g/dL   RDW 82.912.1 56.211.5 - 13.015.5 %   Platelets 258 150 - 400 K/uL   nRBC 0.0 0.0 - 0.2 %  Glucose, capillary     Status: Abnormal   Collection Time: 10/27/20  9:21 AM  Result Value Ref Range   Glucose-Capillary 110 (H) 70 - 99 mg/dL  Glucose, capillary     Status: Abnormal    Collection Time: 10/27/20  1:02 PM  Result Value Ref Range   Glucose-Capillary 100 (H) 70 - 99 mg/dL  Glucose, capillary     Status: Abnormal   Collection Time: 10/27/20  3:05 PM  Result Value Ref Range   Glucose-Capillary 127 (H) 70 - 99 mg/dL   CT ANGIO HEAD W OR WO CONTRAST  Result Date: 10/26/2020 CLINICAL DATA:  Stroke EXAM: CT ANGIOGRAPHY HEAD AND NECK TECHNIQUE: Multidetector CT imaging of the head and neck was performed using the standard protocol during bolus administration of intravenous contrast. Multiplanar CT image reconstructions and MIPs  were obtained to evaluate the vascular anatomy. Carotid stenosis measurements (when applicable) are obtained utilizing NASCET criteria, using the distal internal carotid diameter as the denominator. CONTRAST:  76mL OMNIPAQUE IOHEXOL 350 MG/ML SOLN COMPARISON:  MRI head 10/26/2020 and CT head 10/25/2020 FINDINGS: CT HEAD FINDINGS Brain: Acute infarct posterior limb internal capsule on the left best seen on diffusion-weighted imaging. Small hypodensity in this area. Otherwise no acute infarct, hemorrhage, mass. Ventricle size normal. Vascular: Negative for hyperdense vessel Skull: Negative Sinuses: Complete opacification right maxillary sinus which is contracted. Remaining sinuses clear. Mastoid clear. Orbits: Negative Review of the MIP images confirms the above findings CTA NECK FINDINGS Aortic arch: Atherosclerotic calcification aortic arch and proximal great vessels. Diffuse atherosclerotic disease right subclavian artery with mild stenosis. Moderate stenosis proximal left subclavian artery and left axillary artery. 60% diameter stenosis proximal left common carotid artery. Right carotid system: Right carotid stenting across the bifurcation. Stent is widely patent. Diffuse atherosclerotic calcification throughout the right common carotid artery. Left carotid system: Atherosclerotic disease throughout the left common carotid artery with 60% diameter  stenosis of the proximal left common carotid artery. Atherosclerotic disease left carotid bifurcation without significant internal carotid artery stenosis. Severe stenosis versus occlusion of the proximal left external carotid artery. Vertebral arteries: Right vertebral dominant. 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 Occlusion of the proximal left vertebral artery with reconstitution at the C6-7 level. This vessel is diffusely diseased but patent to the basilar. Skeleton: No acute skeletal abnormality.  Poor dentition. Other neck: Negative for mass or adenopathy in the neck. Upper chest: Mild apical emphysema without acute abnormality. Apical scarring bilaterally. Review of the MIP images confirms the above findings CTA HEAD FINDINGS Anterior circulation: Mild atherosclerotic calcification in the cavernous carotid bilaterally without stenosis. Anterior and middle cerebral arteries patent without stenosis or large vessel occlusion. Posterior circulation: Both vertebral arteries patent to the basilar. PICA patent bilaterally. Basilar widely patent. AICA, superior cerebellar, posterior cerebral arteries patent without stenosis or large vessel occlusion. Venous sinuses: Normal venous enhancement. Anatomic variants: None Review of the MIP images confirms the above findings IMPRESSION: 1. Negative for intracranial large vessel occlusion or flow limiting stenosis. 2. 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 3. Severe atherosclerotic disease in the neck with extensive arterial calcification diffusely. 4. Right carotid stenting patent. 5. 60% diameter stenosis proximal left common carotid artery. Left internal carotid artery patent without significant stenosis. Occlusion or high-grade stenosis proximal left external carotid artery. 6. Occlusion of the proximal vertebral artery bilaterally with reconstitution at  the C6 level. Electronically Signed   By: Marlan Palau M.D.   On: 10/26/2020 12:42   CT ANGIO NECK W OR WO CONTRAST  Result Date: 10/26/2020 CLINICAL DATA:  Stroke EXAM: CT ANGIOGRAPHY HEAD AND NECK TECHNIQUE: Multidetector CT imaging of the head and neck was performed using the standard protocol during bolus administration of intravenous contrast. Multiplanar CT image reconstructions and MIPs were obtained to evaluate the vascular anatomy. Carotid stenosis measurements (when applicable) are obtained utilizing NASCET criteria, using the distal internal carotid diameter as the denominator. CONTRAST:  43mL OMNIPAQUE IOHEXOL 350 MG/ML SOLN COMPARISON:  MRI head 10/26/2020 and CT head 10/25/2020 FINDINGS: CT HEAD FINDINGS Brain: Acute infarct posterior limb internal capsule on the left best seen on diffusion-weighted imaging. Small hypodensity in this area. Otherwise no acute infarct, hemorrhage, mass. Ventricle size normal. Vascular: Negative for hyperdense vessel Skull:  Negative Sinuses: Complete opacification right maxillary sinus which is contracted. Remaining sinuses clear. Mastoid clear. Orbits: Negative Review of the MIP images confirms the above findings CTA NECK FINDINGS Aortic arch: Atherosclerotic calcification aortic arch and proximal great vessels. Diffuse atherosclerotic disease right subclavian artery with mild stenosis. Moderate stenosis proximal left subclavian artery and left axillary artery. 60% diameter stenosis proximal left common carotid artery. Right carotid system: Right carotid stenting across the bifurcation. Stent is widely patent. Diffuse atherosclerotic calcification throughout the right common carotid artery. Left carotid system: Atherosclerotic disease throughout the left common carotid artery with 60% diameter stenosis of the proximal left common carotid artery. Atherosclerotic disease left carotid bifurcation without significant internal carotid artery stenosis. Severe stenosis  versus occlusion of the proximal left external carotid artery. Vertebral arteries: Right vertebral dominant. 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 Occlusion of the proximal left vertebral artery with reconstitution at the C6-7 level. This vessel is diffusely diseased but patent to the basilar. Skeleton: No acute skeletal abnormality.  Poor dentition. Other neck: Negative for mass or adenopathy in the neck. Upper chest: Mild apical emphysema without acute abnormality. Apical scarring bilaterally. Review of the MIP images confirms the above findings CTA HEAD FINDINGS Anterior circulation: Mild atherosclerotic calcification in the cavernous carotid bilaterally without stenosis. Anterior and middle cerebral arteries patent without stenosis or large vessel occlusion. Posterior circulation: Both vertebral arteries patent to the basilar. PICA patent bilaterally. Basilar widely patent. AICA, superior cerebellar, posterior cerebral arteries patent without stenosis or large vessel occlusion. Venous sinuses: Normal venous enhancement. Anatomic variants: None Review of the MIP images confirms the above findings IMPRESSION: 1. Negative for intracranial large vessel occlusion or flow limiting stenosis. 2. 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 3. Severe atherosclerotic disease in the neck with extensive arterial calcification diffusely. 4. Right carotid stenting patent. 5. 60% diameter stenosis proximal left common carotid artery. Left internal carotid artery patent without significant stenosis. Occlusion or high-grade stenosis proximal left external carotid artery. 6. 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 Electronically Signed   By: Marlan Palau M.D.   On: 10/26/2020 12:42   MR BRAIN WO CONTRAST  Result Date: 10/26/2020 CLINICAL DATA:  Stroke, follow-up;  status post tPA for ischemic stroke. Right facial droop and right-sided weakness. EXAM: MRI HEAD WITHOUT CONTRAST TECHNIQUE: Multiplanar, multiecho pulse sequences of the brain and surrounding structures were obtained without intravenous contrast. COMPARISON:  MRI/MRA head 10/25/2020, head CT 10/25/2020. FINDINGS: Brain: Mild intermittent motion degradation. Mild cerebral atrophy. An acute infarct within the left thalamocapsular junction has slightly increased in size as compared to the brain MRI performed earlier today, now measuring 17 mm (previously 15 mm). Corresponding T2/FLAIR hyperintensity at this site. Background mild multifocal T2/FLAIR hyperintensity within the cerebral white matter and pons is nonspecific, but compatible with chronic small vessel ischemic disease. No evidence of intracranial mass. No chronic intracranial blood products. No extra-axial fluid collection. No midline shift. Vascular: Expected proximal arterial flow voids. Skull and upper cervical spine: No focal marrow lesion. Sinuses/Orbits: Visualized orbits show no acute finding. Complete T2 hyperintense opacification of the right maxillary sinus. Mild ethmoid sinus mucosal thickening. IMPRESSION: 17 mm acute infarct within the left thalamocapsular junction, minimally increased in size as compared to the brain MRI performed earlier today. Background mild cerebral atrophy and chronic small vessel ischemic disease. Right maxillary sinusitis. Electronically Signed  By: Jackey Loge DO   On: 10/26/2020 11:21    Assessment/Plan: Diagnosis: Acute left internal capsule infarct  Stroke: Continue secondary stroke prophylaxis and Risk Factor Modification listed below:   Antiplatelet therapy:   Blood Pressure Management:  Continue current medication with prn's with permisive HTN per primary team Statin Agent:   Tobacco abuse:   Right sided hemiparesis: fit for orthosis to prevent contractures (resting hand splint for day, wrist cock up  splint at night, PRAFO, etc) PT/OT for mobility, ADL training  Motor recovery: Zoloft Labs independently reviewed.  Records reviewed and summated above.  1. Does the need for close, 24 hr/day medical supervision in concert with the patient's rehab needs make it unreasonable for this patient to be served in a less intensive setting? Yes  2. Co-Morbidities requiring supervision/potential complications: CAD, sensorineural hearing loss, binge drinking (none for the past month), CVA, hyperglycemia (likely stress-induced-continue to monitor), right hemiparesis 3. Due to safety, disease management and patient education, does the patient require 24 hr/day rehab nursing? Yes 4. Does the patient require coordinated care of a physician, rehab nurse, therapy disciplines of PT/OT/SLP to address physical and functional deficits in the context of the above medical diagnosis(es)? Yes Addressing deficits in the following areas: balance, endurance, locomotion, strength, transferring, bathing, dressing, toileting, speech and psychosocial support 5. Can the patient actively participate in an intensive therapy program of at least 3 hrs of therapy per day at least 5 days per week? Yes 6. The potential for patient to make measurable gains while on inpatient rehab is excellent 7. Anticipated functional outcomes upon discharge from inpatient rehab are supervision and min assist  with PT, supervision and min assist with OT, modified independent with SLP. 8. Estimated rehab length of stay to reach the above functional goals is: 16-20 days. 9. Anticipated discharge destination: Home 10. Overall Rehab/Functional Prognosis: good  RECOMMENDATIONS: This patient's condition is appropriate for continued rehabilitative care in the following setting: CIR Patient has agreed to participate in recommended program. Yes Note that insurance prior authorization may be required for reimbursement for recommended care.  Comment: Rehab  Admissions Coordinator to follow up.  I have personally performed a face to face diagnostic evaluation, including, but not limited to relevant history and physical exam findings, of this patient and developed relevant assessment and plan.  Additionally, I have reviewed and concur with the physician assistant's documentation above.   Maryla Morrow, MD, ABPMR Jacquelynn Cree, PA-C 10/27/2020

## 2020-10-27 NOTE — Progress Notes (Signed)
  Speech Language Pathology Treatment: Dysphagia  Patient Details Name: Rebecca Sutton MRN: [id number] DOB: 05-11-1961 Today's Date: 10/27/2020 Time: 7116-5790 SLP Time Calculation (min) (ACUTE ONLY): 12 min  Assessment / Plan / Recommendation Clinical Impression  Pt was ordered a regular tray s/p angiogram today - she was eating it upon entering room, and doing well pacing herself, taking small sips/bites, and checking for pocketing on the right side with only one initial verbal cue. There were no s/s of aspiration.  We determined that she would stay on a regular diet, and that she could ask RN to downgrade her to dysphagia 3 if she has difficulty chewing these consistencies.  Encouraged her to find items on menu that would be easier for her to handle.  Continue current diet; meds whole in liquid.  Recommend continued f/u for dysarthria and briefly for swallowing.   HPI HPI: Rebecca Sutton is a 59 y.o. female with a past medical history significant for bilateral carotid stenosis, prior right carotid stent due to stroke presenting with left-sided weakness, ongoing tobacco abuse, sensorineural hearing loss bilateral ears. MRI 12/15: "Acute infarct posterior limb internal capsule on the left with  minimal FLAIR signal intensity"  Pt s/p tPA      SLP Plan  Continue with current plan of care       Recommendations  Diet recommendations: Regular;Thin liquid Liquids provided via: Cup;Straw Medication Administration: Whole meds with liquid Supervision: Patient able to self feed Compensations: Slow rate;Small sips/bites;Lingual sweep for clearance of pocketing Postural Changes and/or Swallow Maneuvers: Seated upright 90 degrees                Oral Care Recommendations: Oral care BID Plan: Continue with current plan of care                      Rebecca Hoback L. Samson Frederic, MA CCC/SLP Acute Rehabilitation Services Office number [phone] Pager [phone]  Rebecca Sutton 10/27/2020, 2:37 PM

## 2020-10-27 NOTE — Procedures (Signed)
S/P 4 vessel cerebral artrriogram RT CFA approach. Findings. 1.bilaterally occluded Vertebral arteries. 2.Bilaterally occluded ECAs. 3.Approx 50  To 60 % stenosis  proximal Lt CCA. 4.Approx 50 % stenosis RT SCA prox.  5.Approx 75 % stenosis prox Lt SCA. S.Krysteena Stalker MD

## 2020-10-27 NOTE — Sedation Documentation (Signed)
Right femoral sheath removed. 20fr exoseal closure device used in right groin.

## 2020-10-27 NOTE — Progress Notes (Signed)
Inpatient Rehab Admissions Coordinator Note:   Per therapy recommendations, pt was screened for CIR candidacy by Estill Dooms, PT, DPT.  At this time we are recommending a CIR consult and I will place an order per our protocol.  Please contact me with questions.   Estill Dooms, PT, DPT [phone] 10/27/20 2:15 PM

## 2020-10-27 NOTE — Progress Notes (Signed)
Ok to add atorvastatin 80mg  qday per Dr. for CVA.  Roda Shutters, PharmD, BCIDP, AAHIVP, CPP Infectious Disease Pharmacist 10/27/2020 1:16 PM

## 2020-10-28 LAB — GLUCOSE, CAPILLARY
Glucose-Capillary: 102 mg/dL — ABNORMAL HIGH (ref 70–99)
Glucose-Capillary: 110 mg/dL — ABNORMAL HIGH (ref 70–99)
Glucose-Capillary: 113 mg/dL — ABNORMAL HIGH (ref 70–99)
Glucose-Capillary: 118 mg/dL — ABNORMAL HIGH (ref 70–99)
Glucose-Capillary: 120 mg/dL — ABNORMAL HIGH (ref 70–99)
Glucose-Capillary: 130 mg/dL — ABNORMAL HIGH (ref 70–99)

## 2020-10-28 LAB — BASIC METABOLIC PANEL
Anion gap: 11 (ref 5–15)
BUN: 11 mg/dL (ref 6–20)
CO2: 25 mmol/L (ref 22–32)
Calcium: 9.7 mg/dL (ref 8.9–10.3)
Chloride: 103 mmol/L (ref 98–111)
Creatinine, Ser: 1.08 mg/dL — ABNORMAL HIGH (ref 0.44–1.00)
GFR, Estimated: 60 mL/min — ABNORMAL LOW (ref >60)
Glucose, Bld: 104 mg/dL — ABNORMAL HIGH (ref 70–99)
Potassium: 3.8 mmol/L (ref 3.5–5.1)
Sodium: 139 mmol/L (ref 135–145)

## 2020-10-28 LAB — CBC
HCT: 37 % (ref 36.0–46.0)
Hemoglobin: 12.8 g/dL (ref 12.0–15.0)
MCH: 32.6 pg (ref 26.0–34.0)
MCHC: 34.6 g/dL (ref 30.0–36.0)
MCV: 94.1 fL (ref 80.0–100.0)
Platelets: 265 10*3/uL (ref 150–400)
RBC: 3.93 MIL/uL (ref 3.87–5.11)
RDW: 12.1 % (ref 11.5–15.5)
WBC: 6.6 10*3/uL (ref 4.0–10.5)
nRBC: 0 % (ref 0.0–0.2)

## 2020-10-28 NOTE — Progress Notes (Signed)
Physical Therapy Treatment Patient Details Name: CHEYANNA STRICK MRN: [id number] DOB: 1960-11-27 Today's Date: 10/28/2020    History of Present Illness 59 y.o. female admitted to ED On 12/15 with R weakness, facial droop, and fall. 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    PT Comments    Pt is progressing well towards goals. She required less assist this session for bed mobilities and transfers. She will need significant assist to attempt walking due to weakness and instability on the RLE. Pt performed sit<>stand during session for neuromuscular re-education. She seems cognitively intact with good safety awareness. Pt was able to perform her own peri care while seated on BSC. Feel CIR remains appropriate for pt. Will continue to follow acutely.     Follow Up Recommendations  CIR;Supervision/Assistance - 24 hour     Equipment Recommendations  None recommended by PT    Recommendations for Other Services       Precautions / Restrictions Precautions Precautions: Fall Precaution Comments: R hemiparesis RUE>LE Restrictions Weight Bearing Restrictions: No    Mobility  Bed Mobility Overal bed mobility: Needs Assistance Bed Mobility: Supine to Sit     Supine to sit: HOB elevated;Min assist     General bed mobility comments: min A for trunk stability while pt progress LEs off EOB. Assist to scoot forward.  Transfers Overall transfer level: Needs assistance Equipment used: 2 person hand held assist;1 person hand held assist Transfers: Sit to/from [employer] Sit to Stand: Mod assist Stand pivot transfers: Mod assist;+2 physical assistance       General transfer comment: RLE very weak requiring manual facilitation at quad to prevent  buckling. R ankle instability cuasing ankle to roll during transfer. +2 for safety with SPT, +1 for sit<>stand. Cues for hand placement  Ambulation/Gait             General Gait Details: NT   Stairs             Wheelchair Mobility    Modified Rankin (Stroke Patients Only) Modified Rankin (Stroke Patients Only) Pre-Morbid Rankin Score: No symptoms Modified Rankin: Severe disability     Balance Overall balance assessment: Needs assistance Sitting-balance support: Feet supported;Single extremity supported Sitting balance-Leahy Scale: Fair Sitting balance - Comments: able to sit EOB statically without PT or OT support. Postural control: Posterior lean;Right lateral lean Standing balance support: Bilateral upper extremity supported;During functional activity Standing balance-Leahy Scale: Poor Standing balance comment: reliant on external support                            Cognition Arousal/Alertness: Awake/alert Behavior During Therapy: WFL for tasks assessed/performed Overall Cognitive Status: Within Functional Limits for tasks assessed                                 General Comments: grossly intact      Exercises Other Exercises Other Exercises: repeat sit<>stand 5x. Multimodal cues to use R UE on arm rest. Good power up, though physical assist still required. Assist for stability.    General Comments General comments (skin integrity, edema, etc.): pt transfered to [facility] and was able to perform peri care independently with LUE      Pertinent  Vitals/Pain Pain Assessment: No/denies pain Faces Pain Scale: No hurt    Home Living                      Prior Function            PT Goals (current goals can now be found in the care plan section) Acute Rehab PT Goals Patient Stated Goal: to go home to take care of cats PT Goal Formulation: With patient Time For Goal Achievement: 11/09/20 Potential to Achieve Goals:  Good Progress towards PT goals: Progressing toward goals    Frequency    Min 4X/week      PT Plan Current plan remains appropriate    Co-evaluation              AM-PAC PT "6 Clicks" Mobility   Outcome Measure  Help needed turning from your back to your side while in a flat bed without using bedrails?: A Little Help needed moving from lying on your back to sitting on the side of a flat bed without using bedrails?: A Little Help needed moving to and from a bed to a chair (including a wheelchair)?: A Lot Help needed standing up from a chair using your arms (e.g., wheelchair or bedside chair)?: A Lot Help needed to walk in hospital room?: Total Help needed climbing 3-5 steps with a railing? : Total 6 Click Score: 12    End of Session Equipment Utilized During Treatment: Gait belt Activity Tolerance: Patient tolerated treatment well Patient left: in chair;with call bell/phone within reach;with chair alarm set Nurse Communication: Mobility status PT Visit Diagnosis: Hemiplegia and hemiparesis;Other abnormalities of gait and mobility (R26.89) Hemiplegia - Right/Left: Right Hemiplegia - dominant/non-dominant: Dominant Hemiplegia - caused by: Cerebral infarction     Time: 5784-6962 PT Time Calculation (min) (ACUTE ONLY): 21 min  Charges:  $Neuromuscular Re-education: 8-22 mins                     Kallie Locks, [place] Pager [phone] Acute Rehab   Sheral Apley 10/28/2020, 9:46 AM

## 2020-10-28 NOTE — Progress Notes (Signed)
STROKE TEAM PROGRESS NOTE   INTERVAL HISTORY No family at bedside.  Patient had cerebral angiogram with Dr. Corliss Skains, redemonstrated left CCA proximal 50 to 60% stenosis.  Plan to have left CCA stenting next Tuesday.  However, if CIR placement will before Tuesday, procedure can be postponed at the discharge of CIR. patient is agreeable to procedure.No changes overnight. Stable neurologically.   Vitals:   10/27/20 2301 10/28/20 0301 10/28/20 0702 10/28/20 1102  BP: (!) 111/97 132/87 134/81 (!) 154/70  Pulse: 71 80 70 69  Resp: 20 16 13 15   Temp: 98.5 F (36.9 C) 98.9 F (37.2 C) 97.8 F (36.6 C) 98 F (36.7 C)  TempSrc: Oral Oral Oral Oral  SpO2: 98% 95% 95% 99%  Weight:      Height:       CBC:  Recent Labs  Lab 10/25/20 0941 10/25/20 0948 10/27/20 0419 10/28/20 0304  WBC 6.0  --  6.3 6.6  NEUTROABS 3.8  --   --   --   HGB 12.9   < > 12.5 12.8  HCT 37.6   < > 36.9 37.0  MCV 96.4  --  94.9 94.1  PLT 263  --  258 265   < > = values in this interval not displayed.   Basic Metabolic Panel:  Recent Labs  Lab 10/25/20 0941 10/25/20 0948 10/27/20 0419 10/28/20 0304  NA 137   < > 138 139  K 4.4   < > 3.5 3.8  CL 104   < > 104 103  CO2 21*  --  25 25  GLUCOSE 107*   < > 97 104*  BUN 15   < > 8 11  CREATININE 0.96   < > 0.91 1.08*  CALCIUM 9.6  --  9.6 9.7  MG 2.1  --   --   --   PHOS 3.7  --   --   --    < > = values in this interval not displayed.   Lipid Panel:  Recent Labs  Lab 10/25/20 0941  CHOL 288*  TRIG 70  HDL 61  CHOLHDL 4.7  VLDL 14  LDLCALC 10/27/20*   HgbA1c:  Recent Labs  Lab 10/25/20 1113  HGBA1C 4.9   Urine Drug Screen:  Recent Labs  Lab 10/25/20 2033  LABOPIA NONE DETECTED  COCAINSCRNUR NONE DETECTED  LABBENZ NONE DETECTED  AMPHETMU NONE DETECTED  THCU NONE DETECTED  LABBARB NONE DETECTED    Alcohol Level  Recent Labs  Lab 10/25/20 0941  ETH <10    IMAGING past 24 hours No results found.  PHYSICAL EXAM  Temp:  [97.8 F  (36.6 C)-98.9 F (37.2 C)] 98 F (36.7 C) (12/18 1102) Pulse Rate:  [69-85] 69 (12/18 1102) Resp:  [12-20] 15 (12/18 1102) BP: (111-166)/(70-97) 154/70 (12/18 1102) SpO2:  [95 %-100 %] 99 % (12/18 1102)  General - Well nourished, well developed, in no apparent distress.  Ophthalmologic - fundi not visualized due to noncooperation.  Cardiovascular - Regular rhythm and rate.  Mental Status -  Level of arousal and orientation to time, place, and person were intact. Language including expression, naming, repetition, comprehension was assessed and found intact.  Moderate dysarthria Fund of Knowledge was assessed and was intact.  Cranial Nerves II - XII - II - Visual field intact OU. III, IV, VI - Extraocular movements intact. V - Facial sensation intact bilaterally. VII - right facial droop VIII - Hearing & vestibular intact bilaterally. X - Palate elevates  symmetrically.  Moderate dysarthria XI - Chin turning & shoulder shrug intact bilaterally. XII - Tongue protrusion intact.  Motor Strength - The patient's strength was normal in left upper and lower extremities, however, right extremity flaccid, right lower extremity proximal 3-/5, knee flexion 3/5, ankle DF/PF 0/5.Marland Kitchen  Bulk was normal and fasciculations were absent.   Motor Tone - Muscle tone was assessed at the neck and appendages and was normal.  Reflexes - The patient's reflexes were decreased on the right upper and lower extremity and she had no pathological reflexes.  Sensory - Light touch, temperature/pinprick were assessed and were decreased on the right lower extremity.    Coordination - The patient had normal movements in the left hand with no ataxia or dysmetria.  Tremor was absent.  Gait and Station - deferred.   ASSESSMENT/PLAN [name] is a 59 y.o. female with history of bilateral carotid stenosis, prior right carotid stent due to stroke presenting with left-sided weakness, ongoing tobacco abuse,  sensorineural hearing loss bilateral ears presenting with a fall from R sided weakness. Received IV tPA 10/25/2020 at 1044 per wake-up stroke criteria w/ DWI mismatch. Emesis post tPA w/ neuro worsening. CT neg for hemorrhage.    Stroke:   L PLIC infarct s/p tPA secondary to large vs. small vessel disease source  Code Stroke CT head No acute abnormality. ASPECTS 10.     MRI  L PLIC minimal FLAIR intensity  MRA  Motion degraded  Repeat CT head w/ neuro worsening No acute abnormality. Sinus dz.  MRI  L thalamocapsular jxn infarct slightly larger than earlier MRI  CTA head & neck left CCA proximal high-grade stenosis 60%, right ICA stent patent  Cerebral angiogram bilateral VA occlusion, left CCA 50 to 60% stenosis, right SCA 50%, left SCA 75% stenosis.  2D Echo EF 60-65%. No source of embolus. Moderate grade III transverse aortic plaque.  LDL 213  HgbA1c 4.9  UDS neg  VTE prophylaxis - SCDs   Aspirin 325 mg daily prior to admission, now on aspirin 325 mg daily and clopidogrel 75 mg daily after Plavix load.  Therapy recommendations: CIR  Disposition:  pending   Hx stroke/TIA  09/2018 - dysarthria, L facial droop, L sided weakness and sensory deficit admitted in [facility]. Treated w/ tPA.  CT head and neck showed right ICA thrombus status post R ICA stent placement.  MRI showed right MCA infarcts.  EF 60 to 65%.  A1c 4.7 LDL 106.  Discharged with DAPT and Lipitor 80.   Carotid Stenosis, bilalteral  S/p R CAS 09/2018 at [facility]  CT head and neck this time showed right carotid stent patent, however, left proximal CCA high-grade stenosis 60%  Cerebral angiogram showed left CCA 50 to 60% stenosis  Plan to have left CCA stenting next Tuesday.  However, if CIR placement before Tuesday, procedure can be postponed at the discharge of CIR.   Hyperlipidemia  Home meds:  None   Put on Lipitor 80   LDL 213, goal < 70  Continue statin at discharge  Tobacco abuse  Current  smoker  Smoking cessation counseling provided  Pt is willing to quit  Other Stroke Risk Factors  Hx severe ETOH use, last nmtake 09/04/2020. alcohol level <10, advised to drink no more than 1 drink(s) a day  Substance abuse - Hx crack cocaine use   Family hx stroke (distant "someone on her mother's side)  Other Active Problems, Findings and Recommendations  Anxiety on buspar, sertraline,  trazodone  Nausea and emesis on zofran prn  Plan to have left CCA stenting next Tuesday.  However, if CIR placement before Tuesday, procedure can be postponed at the discharge of CIR.   Hospital day # 3  Personally examined patient and images, and have participated in and made any corrections needed to history, physical, neuro exam,assessment and plan as stated above.  I have personally obtained the history, evaluated lab date, reviewed imaging studies and agree with radiology interpretations.    Naomie Dean, MD Stroke Neurology  I spent 15 minutes of face-to-face and non-face-to-face time with patient. This included prechart review, lab review, study review, order entry, electronic health record documentation, patient education on the different diagnostic and therapeutic options, counseling and coordination of care, risks and benefits of management, compliance, or risk factor reduction  To contact Stroke Continuity provider, please refer to [email or web address]. After hours, contact General Neurology

## 2020-10-28 NOTE — Progress Notes (Signed)
Inpatient Rehab Admissions Coordinator:  Spoke with pt's mom, Peggy via telephone. Explained CIR goals and expectations. She acknowledged understanding. She reported that pt will d/c to her house at time of discharge.  She indicated that she will be able to provide supervision and assistance to pt. Will continue to follow.   Wolfgang Phoenix, MS, CCC-SLP Admissions Coordinator [phone]

## 2020-10-28 NOTE — Progress Notes (Signed)
Inpatient Rehab Admissions:  Inpatient Rehab Consult received.  I met with patient at the bedside for rehabilitation assessment and to discuss goals and expectations of an inpatient rehab admission.  Patient acknowledged understanding of CIR goals and expectations. Pt interested in pursuing CIR.  Pt gave permission for [facility] to contact mother and/or daughter to explain CIR goals and expectations to them as well.  Will continue to follow.   Signed: Gayland Curry, [place], [place] Admissions Coordinator [phone]

## 2020-10-28 NOTE — PMR Pre-admission (Signed)
PMR Admission Coordinator Pre-Admission Assessment  Patient: Rebecca Sutton is an 59 y.o., female MRN: [id number] DOB: 1961/01/12 Height: 5\' 3"  (160 cm) Weight: 67.7 kg              Insurance Information HMO:     PPO:      PCP:      IPA:      80/20:      OTHER:  PRIMARY: Uninsured (Medicaid Potential)      Policy#:       Subscriber:  CM Name:       Phone#:      Fax#:  Pre-Cert#:       Employer:  Benefits:  Phone #:      Name:  Eff. Date:      Deduct:       Out of Pocket Max:       Life Max:   CIR:       SNF:  Outpatient:      Co-Pay:  Home Health:       Co-Pay:  DME:      Co-Pay:  Providers:  SECONDARY:       Policy#:       Phone#:   :       Phone#:   [employer]" for patients in Inpatient Rehabilitation Facilities with attached "Privacy Act Statement-Health Care Records" was provided and verbally reviewed with: N/A  Emergency Contact Information Contact Information    Name Relation Home Work Mobile   Rebecca Sutton, Rebecca Sutton Daughter   [phone]     Current Medical History  Patient Admitting Diagnosis: acute left internal capsule infarct  History of Present Illness: Pt is a 59 y.o. female with history of CAD, sensorineural hearing loss, binge drinking (none for the past month), CVA who was admitted on 10/25/2020 with right facial droop, right hemiparesis, and a fall.  History taken from chart review, patient, and mother.  MRI/MRI brain showed acute left internal capsule infarct with minimal flair intensity and no LVO. CTA head/neck showed acute infarct in left internal capsule, patent right ICA stent, 60% stenosis proximal L-CCA, occlusion or high-grade stenosis proximal L-PCA, occlusion of proximal B-VA with reconstitution at C6 level.  Echocardiogram with ejection fraction of 60-65%, no wall abnormality, moderate layering plaque in transverse aorta and was negative for shunt. Patient loaded with Plavix and started on DAPT with  recommendations for cerebral angio for work-up. She underwent cerebral angiogram on 10/27/2020 revealing bilateral vertebral artery occlusion PCAs, 50 to 60% stenosis proximal L-CCA, 50% stenosis R-SCA proximally and 75% stenosis L-SCA proximally.  Speech therapy evaluation revealed mild to moderate flaccid dysarthria with oral motor weakness. Swallow function intact. OT evaluation reveals deficits due to right hemiparesis with impaired balance, decreased activity tolerance, and posterior lean affecting ADLs and mobility. CIR was recommended to functional decline.  Complete NIHSS TOTAL: 1 Glasgow Coma Scale Score: 15  Past Medical History  Past Medical History:  Diagnosis Date  . CAD (coronary artery disease)   . Carotid stenosis    bilateral  . CVA (cerebral vascular accident) (HCC) 2019  . H/O ETOH abuse 08/2020  . Smoking trying to quit    1/2 PPD  . Substance abuse (HCC)    last used 20 years ago    Family History  family history includes COPD in her father; Diabetes in her mother; Heart attack in her father; Heart disease in her paternal uncle; High blood pressure in her mother.  Prior Rehab/Hospitalizations:  Has the patient had prior rehab or hospitalizations prior to admission? No  Has the patient had major surgery during 100 days prior to admission? No  Current Medications   Current Facility-Administered Medications:  .  0.9 %  sodium chloride infusion, , Intravenous, Continuous, Toberman, Stevi W, NP, Last Rate: 50 mL/hr at 10/26/20 2257, New Bag at 10/26/20 2257 .  acetaminophen (TYLENOL) tablet 650 mg, 650 mg, Oral, Q4H PRN, 650 mg at 10/28/20 0757 **OR** acetaminophen (TYLENOL) 160 MG/5ML solution 650 mg, 650 mg, Per Tube, Q4H PRN **OR** acetaminophen (TYLENOL) suppository 650 mg, 650 mg, Rectal, Q4H PRN, Lanae Boast W, NP .  aspirin EC tablet 81 mg, 81 mg, Oral, Daily, Micki Riley, MD, 81 mg at 11/01/20 0856 .  atorvastatin (LIPITOR) tablet 80 mg, 80 mg,  Oral, Daily, Pham, Minh Q, RPH-CPP, 80 mg at 11/01/20 0856 .  busPIRone (BUSPAR) tablet 10 mg, 10 mg, Oral, BID, Bhagat, Srishti L, MD, 10 mg at 11/01/20 0856 .  clopidogrel (PLAVIX) tablet 75 mg, 75 mg, Oral, Daily, Marvel Plan, MD, 75 mg at 11/01/20 0856 .  enoxaparin (LOVENOX) injection 40 mg, 40 mg, Subcutaneous, Q24H, Marvel Plan, MD, 40 mg at 10/31/20 2216 .  iohexol (OMNIPAQUE) 300 MG/ML solution 150 mL, 150 mL, Per Tube, Once PRN, Deveshwar, Sanjeev, MD .  labetalol (NORMODYNE) injection 5 mg, 5 mg, Intravenous, Q2H PRN, Marvel Plan, MD .  ondansetron (ZOFRAN) injection 4 mg, 4 mg, Intravenous, Q6H PRN, Bhagat, Srishti L, MD, 4 mg at 10/25/20 1203 .  pantoprazole (PROTONIX) EC tablet 40 mg, 40 mg, Oral, Daily, Marvel Plan, MD, 40 mg at 11/01/20 0856 .  senna-docusate (Senokot-S) tablet 1 tablet, 1 tablet, Oral, QHS PRN, Kara Mead, NP .  sertraline (ZOLOFT) tablet 100 mg, 100 mg, Oral, Daily, Marvel Plan, MD, 100 mg at 10/30/20 1715 .  traZODone (DESYREL) tablet 75 mg, 75 mg, Oral, QHS, Bhagat, Srishti L, MD, 75 mg at 10/31/20 2216  Patients Current Diet:  Diet Order            Diet Heart Room service appropriate? Yes; Fluid consistency: Thin  Diet effective now                 Precautions / Restrictions Precautions Precautions: Fall Precaution Comments: R hemiparesis RUE>LE Restrictions Weight Bearing Restrictions: No   Has the patient had 2 or more falls or a fall with injury in the past year?Yes  Prior Activity Level Limited Community (1-2x/wk): leaves house 2-3x/week, does not drive, not working, does not use DME at baseline  Prior Functional Level Prior Function Level of Independence: Independent Comments: does everything for self, does not drive but has mother/daughter to take her places.  Has 4 cats  Self Care: Did the patient need help bathing, dressing, using the toilet or eating?  Independent  Indoor Mobility: Did the patient need assistance with  walking from room to room (with or without device)? Independent  Stairs: Did the patient need assistance with internal or external stairs (with or without device)? Independent  Functional Cognition: Did the patient need help planning regular tasks such as shopping or remembering to take medications? Independent  Home Assistive Devices / Equipment Home Assistive Devices/Equipment: None Home Equipment: Walker - 2 wheels,Bedside commode,Wheelchair - manual  Prior Device Use: Indicate devices/aids used by the patient prior to current illness, exacerbation or injury? None of the above  Current Functional Level Cognition  Arousal/Alertness: Awake/alert Overall Cognitive Status: Within Functional Limits for tasks assessed Orientation  Level: Oriented X4 General Comments: grossly intact Memory: Appears intact Awareness: Appears intact Problem Solving: Appears intact Safety/Judgment: Appears intact    Extremity Assessment (includes Sensation/Coordination)  Upper Extremity Assessment: RUE deficits/detail RUE Deficits / Details: Pt demonstrates movement of Rt UE and hand in Brunnstrom stage 2 -flexor synergies beginning.  PROM WFL RUE Coordination: decreased fine motor,decreased gross motor  Lower Extremity Assessment: Defer to PT evaluation RLE Deficits / Details: 2+/5 knee extension, 2/5 hip flexion (modified, in supine), 3/5 hip abd/add (modified, in supine), 0/5 PF/DF. flexor synergy noted in WB RLE Sensation: WNL    ADLs  Overall ADL's : Needs assistance/impaired Eating/Feeding: Set up,Sitting Grooming: Oral care,Standing,Minimal assistance,Moderate assistance,Cueing for sequencing,Cueing for compensatory techniques Grooming Details (indicate cue type and reason): MIN - MOD A for standing balance, cues to incorporate RUE as gross assist Upper Body Bathing: Moderate assistance,Sitting Lower Body Bathing: Maximal assistance,Sit to/from stand Upper Body Dressing : Maximal  assistance,Sitting Lower Body Dressing: Total assistance,Bed level Lower Body Dressing Details (indicate cue type and reason): to don socks from bed level Toilet Transfer: Minimal assistance,+2 for physical assistance,Ambulation Toilet Transfer Details (indicate cue type and reason): simulated via functional mobility Toileting- Clothing Manipulation and Hygiene: Total assistance,Sit to/from stand Functional mobility during ADLs: Minimal assistance,+2 for physical assistance General ADL Comments: pt continue to present with R sided weakness, imparied balance, and decreased activity tolerance    Mobility  Overal bed mobility: Needs Assistance Bed Mobility: Supine to Sit Supine to sit: HOB elevated,Supervision General bed mobility comments: increased time with supervision for lines    Transfers  Overall transfer level: Needs assistance Equipment used: 1 person hand held assist Transfers: Sit to/from Stand Sit to Stand: Min assist Stand pivot transfers: Mod assist,+2 physical assistance General transfer comment: Repeated x 5 for RLE strengthening (using RUE only to assist forcing use of RLE)    Ambulation / Gait / Stairs / Wheelchair Mobility  Ambulation/Gait Ambulation/Gait assistance: Min assist,Mod assist Gait Distance (Feet): 6 Feet (standing activities at sink, fatigued and mod assist to walk 4 ft to recliner) Assistive device: 1 person hand held assist (at end, Lt HHA and RUE on counter) Gait Pattern/deviations: Step-to pattern,Decreased stance time - right,Decreased weight shift to right,Decreased dorsiflexion - right General Gait Details: Ambulated to sink with Lt HHA with no rt knee buckling, cues for knee extension. As pt fatigued, R knee with mild buckling as walking sink to recliner. Gait velocity: decreased    Posture / Balance Dynamic Sitting Balance Sitting balance - Comments: able to reach to lt foot to pull up her sock Balance Overall balance assessment: Needs  assistance Sitting-balance support: Feet supported,No upper extremity supported Sitting balance-Leahy Scale: Good Sitting balance - Comments: able to reach to lt foot to pull up her sock Postural control: Posterior lean,Right lateral lean Standing balance support: Single extremity supported Standing balance-Leahy Scale: Poor Standing balance comment: Pt requiring UE support.  DId perform brushing teeth at sink in standing - pt requiring mod A at times due to leaning R    Special needs/care consideration Continuous Drip IV  0.9% sodium chloride infusion: 76mL/hr, Skin ecchymosis: arm; bilateral;  Incision: groin, bladder incontinence; external urinary catheter and Designated visitor Daleen Snook, daughter; Gigi Gin, mother     Previous Home Environment (from acute therapy documentation) Living Arrangements: Non-relatives/Friends  Lives With: Other (Comment) (roomate. May not live with him anymore in near future) Available Help at Discharge: Family,Available PRN/intermittently Type of Home: House Home Layout: One level Home Access: Stairs  to enter Entrance Stairs-Rails: Interior and spatial designeright,Left Entrance Stairs-Number of Steps: 2 Bathroom Shower/Tub: Associate ProfessorTub/shower unit Bathroom Toilet: Standard Bathroom Accessibility: Yes How Accessible: Accessible via walker Home Care Services: No Additional Comments: has access to equipment, from late brother (passed in October)  Discharge Living Setting Plans for Discharge Living Setting: House Type of Home at Discharge: House Discharge Home Layout: Two level,Able to live on main level with bedroom/bathroom Discharge Home Access: Stairs to enter Entrance Stairs-Rails: Left Entrance Stairs-Number of Steps: 3 Discharge Bathroom Shower/Tub: Walk-in shower Discharge Bathroom Toilet: Standard Discharge Bathroom Accessibility: Yes How Accessible: Accessible via walker Does the patient have any problems obtaining your medications?: Yes (Describe) ([facility])  Social/Family/Support Systems Patient Roles: Parent Contact Information: daughter, Efraim KaufmannMelissa Anticipated Caregiver: Gigi Gineggy, mom; Daleen SnookMelissa Hohn, daughter Anticipated Caregiver's Contact Information: Peggy: [phone] Caregiver Availability: 24/7 Discharge Plan Discussed with Primary Caregiver: Yes Is Caregiver In Agreement with Plan?: Yes Does Caregiver/Family have Issues with Lodging/Transportation while Pt is in Rehab?: No   Goals Patient/Family Goal for Rehab: supervision-Min A:PT/OT, Mod I: ST Expected length of stay: 16-20 days Pt/Family Agrees to Admission and willing to participate: Yes Program Orientation Provided & Reviewed with Pt/Caregiver Including Roles  & Responsibilities: Yes Additional Information Needs: Gigi Gineggy and Efraim KaufmannMelissa will share caregiving responsibilities  Barriers to Discharge: Insurance for SNF coverage (uninsured)   Decrease burden of Care through IP rehab admission: NA   Possible need for SNF placement upon discharge: NA   Patient Condition: This patient's medical and functional status has changed since the consult dated: 12/17 in which the Rehabilitation Physician determined and documented that the patient's condition is appropriate for intensive rehabilitative care in an inpatient rehabilitation facility. See "History of Present Illness" (above) for medical update. Functional changes are: pt is min to mod assist for mobility with HHA, gait x6', max assist ADLs. Patient's medical and functional status update has been discussed with the Rehabilitation physician and patient remains appropriate for inpatient rehabilitation. Will admit to inpatient rehab today.  Preadmission Screen Completed By: Wolfgang PhoenixLauren Graves Madden with updates by Stephania Fragminaitlin E Alhaji Mcneal, PT, 11/01/2020 10:27 AM ______________________________________________________________________   Discussed status with Dr. Carlis Abbottaulkar on 11/01/20 at 10:28 AM  and received approval for admission  today.  Admission Coordinator: Wolfgang PhoenixLauren Graves Madden and  Stephania FragminCaitlin E Shanira Tine, time 10:28 AM Dorna Bloom/Date 11/01/20

## 2020-10-29 LAB — GLUCOSE, CAPILLARY
Glucose-Capillary: 107 mg/dL — ABNORMAL HIGH (ref 70–99)
Glucose-Capillary: 107 mg/dL — ABNORMAL HIGH (ref 70–99)
Glucose-Capillary: 114 mg/dL — ABNORMAL HIGH (ref 70–99)
Glucose-Capillary: 160 mg/dL — ABNORMAL HIGH (ref 70–99)
Glucose-Capillary: 160 mg/dL — ABNORMAL HIGH (ref 70–99)
Glucose-Capillary: 163 mg/dL — ABNORMAL HIGH (ref 70–99)
Glucose-Capillary: 91 mg/dL (ref 70–99)

## 2020-10-29 LAB — CBC
HCT: 35.4 % — ABNORMAL LOW (ref 36.0–46.0)
Hemoglobin: 12.2 g/dL (ref 12.0–15.0)
MCH: 32.4 pg (ref 26.0–34.0)
MCHC: 34.5 g/dL (ref 30.0–36.0)
MCV: 93.9 fL (ref 80.0–100.0)
Platelets: 268 10*3/uL (ref 150–400)
RBC: 3.77 MIL/uL — ABNORMAL LOW (ref 3.87–5.11)
RDW: 12 % (ref 11.5–15.5)
WBC: 5.5 10*3/uL (ref 4.0–10.5)
nRBC: 0 % (ref 0.0–0.2)

## 2020-10-29 LAB — BASIC METABOLIC PANEL
Anion gap: 10 (ref 5–15)
BUN: 11 mg/dL (ref 6–20)
CO2: 24 mmol/L (ref 22–32)
Calcium: 9.4 mg/dL (ref 8.9–10.3)
Chloride: 101 mmol/L (ref 98–111)
Creatinine, Ser: 0.96 mg/dL (ref 0.44–1.00)
GFR, Estimated: >60 mL/min (ref >60)
Glucose, Bld: 113 mg/dL — ABNORMAL HIGH (ref 70–99)
Potassium: 3.7 mmol/L (ref 3.5–5.1)
Sodium: 135 mmol/L (ref 135–145)

## 2020-10-29 NOTE — Progress Notes (Signed)
STROKE TEAM PROGRESS NOTE   INTERVAL HISTORY No family at bedside.  Patient had cerebral angiogram with Dr. Corliss Skains, redemonstrated left CCA proximal 50 to 60% stenosis.  Plan to have left CCA stenting next Tuesday.  However, if CIR placement will before Tuesday, procedure can be postponed at the discharge of CIR. patient is agreeable to procedure.No changes overnight. Stable neurologically.   Vitals:   10/29/20 0302 10/29/20 0703 10/29/20 1103 10/29/20 1503  BP: 138/72 125/80 121/70 101/61  Pulse: 69 70 86 73  Resp: 11 10 20 12   Temp: 98.2 F (36.8 C)     TempSrc: Oral     SpO2: 99% 98% 98% 94%  Weight:      Height:       CBC:  Recent Labs  Lab 10/25/20 0941 10/25/20 0948 10/28/20 0304 10/29/20 0100  WBC 6.0   < > 6.6 5.5  NEUTROABS 3.8  --   --   --   HGB 12.9   < > 12.8 12.2  HCT 37.6   < > 37.0 35.4*  MCV 96.4   < > 94.1 93.9  PLT 263   < > 265 268   < > = values in this interval not displayed.   Basic Metabolic Panel:  Recent Labs  Lab 10/25/20 0941 10/25/20 0948 10/28/20 0304 10/29/20 0100  NA 137   < > 139 135  K 4.4   < > 3.8 3.7  CL 104   < > 103 101  CO2 21*   < > 25 24  GLUCOSE 107*   < > 104* 113*  BUN 15   < > 11 11  CREATININE 0.96   < > 1.08* 0.96  CALCIUM 9.6   < > 9.7 9.4  MG 2.1  --   --   --   PHOS 3.7  --   --   --    < > = values in this interval not displayed.   Lipid Panel:  Recent Labs  Lab 10/25/20 0941  CHOL 288*  TRIG 70  HDL 61  CHOLHDL 4.7  VLDL 14  LDLCALC 10/27/20*   HgbA1c:  Recent Labs  Lab 10/25/20 1113  HGBA1C 4.9   Urine Drug Screen:  Recent Labs  Lab 10/25/20 2033  LABOPIA NONE DETECTED  COCAINSCRNUR NONE DETECTED  LABBENZ NONE DETECTED  AMPHETMU NONE DETECTED  THCU NONE DETECTED  LABBARB NONE DETECTED    Alcohol Level  Recent Labs  Lab 10/25/20 0941  ETH <10    IMAGING past 24 hours No results found.  PHYSICAL EXAM  Temp:  [98 F (36.7 C)-98.2 F (36.8 C)] 98.2 F (36.8 C) (12/19  0302) Pulse Rate:  [69-86] 73 (12/19 1503) Resp:  [10-20] 12 (12/19 1503) BP: (101-141)/(61-80) 101/61 (12/19 1503) SpO2:  [94 %-99 %] 94 % (12/19 1503)  General - Well nourished, well developed, in no apparent distress.  Ophthalmologic - fundi not visualized due to noncooperation.  Cardiovascular - Regular rhythm and rate.  Mental Status -  Level of arousal and orientation to time, place, and person were intact. Language including expression, naming, repetition, comprehension was assessed and found intact.  Moderate dysarthria Fund of Knowledge was assessed and was intact.  Cranial Nerves II - XII - II - Visual field intact OU. III, IV, VI - Extraocular movements intact. V - Facial sensation intact bilaterally. VII - right facial droop VIII - Hearing & vestibular intact bilaterally. X - Palate elevates symmetrically.  Moderate dysarthria XI -  Chin turning & shoulder shrug intact bilaterally. XII - Tongue protrusion intact.  Motor Strength - The patient's strength was normal in left upper and lower extremities, however, right extremity flaccid, right lower extremity proximal 3-/5, knee flexion 3/5, ankle DF/PF 0/5.Marland Kitchen  Bulk was normal and fasciculations were absent.   Motor Tone - Muscle tone was assessed at the neck and appendages and was normal.  Reflexes - The patient's reflexes were decreased on the right upper and lower extremity and she had no pathological reflexes.  Sensory - Light touch, temperature/pinprick were assessed and were decreased on the right lower extremity.    Coordination - The patient had normal movements in the left hand with no ataxia or dysmetria.  Tremor was absent.  Gait and Station - deferred.   ASSESSMENT/PLAN Rebecca Sutton is a 59 y.o. female with history of bilateral carotid stenosis, prior right carotid stent due to stroke presenting with left-sided weakness, ongoing tobacco abuse, sensorineural hearing loss bilateral ears presenting with a  fall from R sided weakness. Received IV tPA 10/25/2020 at 1044 per wake-up stroke criteria w/ DWI mismatch. Emesis post tPA w/ neuro worsening. CT neg for hemorrhage.    Stroke:   L PLIC infarct s/p tPA secondary to large vs. small vessel disease source  Code Stroke CT head No acute abnormality. ASPECTS 10.     MRI  L PLIC minimal FLAIR intensity  MRA  Motion degraded  Repeat CT head w/ neuro worsening No acute abnormality. Sinus dz.  MRI  L thalamocapsular jxn infarct slightly larger than earlier MRI  CTA head & neck left CCA proximal high-grade stenosis 60%, right ICA stent patent  Cerebral angiogram bilateral VA occlusion, left CCA 50 to 60% stenosis, right SCA 50%, left SCA 75% stenosis.  2D Echo EF 60-65%. No source of embolus. Moderate grade III transverse aortic plaque.  LDL 213  HgbA1c 4.9  UDS neg  VTE prophylaxis - SCDs   Aspirin 325 mg daily prior to admission, now on aspirin 325 mg daily and clopidogrel 75 mg daily after Plavix load.  Therapy recommendations: CIR  Disposition:  pending   Hx stroke/TIA  09/2018 - dysarthria, L facial droop, L sided weakness and sensory deficit admitted in [place]. Treated w/ tPA.  CT head and neck showed right ICA thrombus status post R ICA stent placement.  MRI showed right MCA infarcts.  EF 60 to 65%.  A1c 4.7 LDL 106.  Discharged with DAPT and Lipitor 80.   Carotid Stenosis, bilalteral  S/p R CAS 09/2018 at [facility]  CT head and neck this time showed right carotid stent patent, however, left proximal CCA high-grade stenosis 60%  Cerebral angiogram showed left CCA 50 to 60% stenosis  Plan to have left CCA stenting next Tuesday.  However, if CIR placement before Tuesday, procedure can be postponed at the discharge of CIR.   Hyperlipidemia  Home meds:  None   Put on Lipitor 80   LDL 213, goal < 70  Continue statin at discharge  Tobacco abuse  Current smoker  Smoking cessation counseling provided  Pt is  willing to quit  Other Stroke Risk Factors  Hx severe ETOH use, last nmtake 09/04/2020. alcohol level <10, advised to drink no more than 1 drink(s) a day  Substance abuse - Hx crack cocaine use   Family hx stroke (distant "someone on her mother's side)  Other Active Problems, Findings and Recommendations  Anxiety on buspar, sertraline, trazodone  Nausea and emesis on  zofran prn  Plan to have left CCA stenting next Tuesday.  However, if CIR placement before Tuesday, procedure can be postponed at the discharge of CIR.   Labs stable.  BP somewhat low at times considering  Left internal carotid artery stenosis however pt is not on any scheduled anti-htn medications. She is currently getting NS at 50 mls / hr. Consider increasing IV fluids if BP continues to run low.  Hospital day # 4 Personally examined patient and images, and have participated in and made any corrections needed to history, physical, neuro exam,assessment and plan as stated above.  I have personally obtained the history, evaluated lab date, reviewed imaging studies and agree with radiology interpretations.    Naomie Dean, MD Stroke Neurology  I spent 15 minutes of face-to-face and non-face-to-face time with patient. This included prechart review, lab review, study review, order entry, electronic health record documentation, patient education on the different diagnostic and therapeutic options, counseling and coordination of care, risks and benefits of management, compliance, or risk factor reduction   To contact Stroke Continuity provider, please refer to [email or web address]. After hours, contact General Neurology

## 2020-10-30 ENCOUNTER — Inpatient Hospital Stay (HOSPITAL_COMMUNITY): Payer: Medicaid Other

## 2020-10-30 DIAGNOSIS — I639 Cerebral infarction, unspecified: Secondary | ICD-10-CM

## 2020-10-30 LAB — GLUCOSE, CAPILLARY
Glucose-Capillary: 109 mg/dL — ABNORMAL HIGH (ref 70–99)
Glucose-Capillary: 116 mg/dL — ABNORMAL HIGH (ref 70–99)
Glucose-Capillary: 126 mg/dL — ABNORMAL HIGH (ref 70–99)
Glucose-Capillary: 129 mg/dL — ABNORMAL HIGH (ref 70–99)
Glucose-Capillary: 157 mg/dL — ABNORMAL HIGH (ref 70–99)
Glucose-Capillary: 93 mg/dL (ref 70–99)

## 2020-10-30 LAB — CBC
HCT: 37.4 % (ref 36.0–46.0)
Hemoglobin: 13 g/dL (ref 12.0–15.0)
MCH: 32.8 pg (ref 26.0–34.0)
MCHC: 34.8 g/dL (ref 30.0–36.0)
MCV: 94.4 fL (ref 80.0–100.0)
Platelets: 274 10*3/uL (ref 150–400)
RBC: 3.96 MIL/uL (ref 3.87–5.11)
RDW: 11.9 % (ref 11.5–15.5)
WBC: 6.3 10*3/uL (ref 4.0–10.5)
nRBC: 0 % (ref 0.0–0.2)

## 2020-10-30 LAB — BASIC METABOLIC PANEL
Anion gap: 11 (ref 5–15)
BUN: 18 mg/dL (ref 6–20)
CO2: 26 mmol/L (ref 22–32)
Calcium: 10 mg/dL (ref 8.9–10.3)
Chloride: 99 mmol/L (ref 98–111)
Creatinine, Ser: 1 mg/dL (ref 0.44–1.00)
GFR, Estimated: >60 mL/min (ref >60)
Glucose, Bld: 112 mg/dL — ABNORMAL HIGH (ref 70–99)
Potassium: 4.3 mmol/L (ref 3.5–5.1)
Sodium: 136 mmol/L (ref 135–145)

## 2020-10-30 MED ORDER — CLOPIDOGREL BISULFATE 75 MG PO TABS
75.0000 mg | ORAL_TABLET | Freq: Every day | ORAL | Status: DC
  Filled 2020-10-30: qty 1

## 2020-10-30 MED ORDER — ASPIRIN EC 81 MG PO TBEC
81.0000 mg | DELAYED_RELEASE_TABLET | Freq: Every day | ORAL | Status: DC
  Administered 2020-10-30 – 2020-11-01 (×3): 81 mg via ORAL
  Filled 2020-10-30 (×3): qty 1

## 2020-10-30 NOTE — Progress Notes (Signed)
Physical Therapy Treatment Patient Details Name: Rebecca Sutton MRN: [id number] DOB: Feb 28, 1961 Today's Date: 10/30/2020    History of Present Illness Pt is 59 y.o. female admitted to ED On 12/15 with R weakness, facial droop, and fall. 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    PT Comments    Pt making excellent progress today with improved use of R UE and LE.  She was able to take a few steps with min A of 2 but with fatigue R LE did buckle requiring blocking.  Pt very motivated and with excellent rehab potential.    Follow Up Recommendations  CIR;Supervision/Assistance - 24 hour     Equipment Recommendations  None recommended by PT    Recommendations for Other Services       Precautions / Restrictions Precautions Precautions: Fall Precaution Comments: R hemiparesis RUE>LE    Mobility  Bed Mobility Overal bed mobility: Needs Assistance Bed Mobility: Supine to Sit     Supine to sit: HOB elevated;Min guard     General bed mobility comments: increased time with min guard for lines  Transfers Overall transfer level: Needs assistance Equipment used: 2 person hand held assist;1 person hand held assist Transfers: Sit to/from Stand Sit to Stand: Min assist;+2 safety/equipment         General transfer comment: Performed sit to stand x 2 (for transfers) with min A of 2 for safety; cues for controlled descent  Ambulation/Gait Ambulation/Gait assistance: Min assist;+2 safety/equipment Gait Distance (Feet): 5 Feet (3' then 5'x2) Assistive device: 2 person hand held assist Gait Pattern/deviations: Step-to pattern;Decreased stance time - right;Decreased weight shift to right Gait velocity: decreased   General Gait Details: Ambulated to recliner then  from recliner to sink and back to recliner.  Cues for step to R pattern.  Some ataxia R LE - provided visual target that improved ataxia. As pt fatigued, R knee with mild buckling. Reports R ankle feels unstable.   Stairs             Wheelchair Mobility    Modified Rankin (Stroke Patients Only) Modified Rankin (Stroke Patients Only) Pre-Morbid Rankin Score: No symptoms Modified Rankin: Moderately severe disability     Balance Overall balance assessment: Needs assistance Sitting-balance support: Feet supported;No upper extremity supported Sitting balance-Leahy Scale: Good     Standing balance support: Bilateral upper extremity supported;Single extremity supported Standing balance-Leahy Scale: Poor Standing balance comment: Pt requiring UE support.  DId perform brushing teeth at sink in standing - pt requiring mod A at times due to leaning R                            Cognition Arousal/Alertness: Awake/alert Behavior During Therapy: WFL for tasks assessed/performed Overall Cognitive Status: Within Functional Limits for tasks assessed                                        Exercises Other Exercises Other Exercises: Sit to stand x 3 with L leg forward to focus on R LE.  As fatigued required mod A and assist at right knee. Other Exercises: Mini -squat/work on terminal extension R LE:  Standing with weight shifting to slightly  flex R knee and them straighten into terminal extenxion x 10 with min-mod A for balance.  Cues for R knee control with therapist blocking 50% of time.    General Comments        Pertinent Vitals/Pain Pain Assessment: No/denies pain    Home Living                      Prior Function            PT Goals (current goals can now be found in the care plan section) Acute Rehab PT Goals Patient Stated Goal: to go home to take care of cats PT Goal Formulation: With patient Time For Goal Achievement:  11/09/20 Potential to Achieve Goals: Good Progress towards PT goals: Progressing toward goals    Frequency    Min 4X/week      PT Plan Current plan remains appropriate    Co-evaluation PT/OT/SLP Co-Evaluation/Treatment: Yes Reason for Co-Treatment: For patient/therapist safety (was mod A x 2 last visit and no tech time) PT goals addressed during session: Mobility/safety with mobility OT goals addressed during session: ADL's and self-care      AM-PAC PT "6 Clicks" Mobility   Outcome Measure  Help needed turning from your back to your side while in a flat bed without using bedrails?: A Little Help needed moving from lying on your back to sitting on the side of a flat bed without using bedrails?: A Little Help needed moving to and from a bed to a chair (including a wheelchair)?: A Little Help needed standing up from a chair using your arms (e.g., wheelchair or bedside chair)?: A Little Help needed to walk in hospital room?: A Lot Help needed climbing 3-5 steps with a railing? : Total 6 Click Score: 15    End of Session Equipment Utilized During Treatment: Gait belt Activity Tolerance: Patient tolerated treatment well Patient left: in chair;with call bell/phone within reach;with chair alarm set Nurse Communication: Mobility status PT Visit Diagnosis: Hemiplegia and hemiparesis;Other abnormalities of gait and mobility (R26.89) Hemiplegia - Right/Left: Right Hemiplegia - dominant/non-dominant: Dominant Hemiplegia - caused by: Cerebral infarction     Time: 1501-1530 PT Time Calculation (min) (ACUTE ONLY): 29 min  Charges:  $Gait Training: 8-22 mins                     Rebecca Sutton, PT Acute Rehab Services Pager [phone] [facility] [phone]     Rebecca Sutton 10/30/2020, 3:41 PM

## 2020-10-30 NOTE — Progress Notes (Signed)
Carotid US complete    Please see CV Proc for preliminary results.   Destry Bezdek, RVT  

## 2020-10-30 NOTE — Progress Notes (Signed)
Inpatient Rehab Admissions Coordinator:  Saw pt at bedside. Informed her there are no beds available in CIR today.  Also informed her that [facility] contacted financial counselor, Cathlean Marseilles, per her request; message left. Will continue to follow.    Wolfgang Phoenix, MS, CCC-SLP Admissions Coordinator [phone]

## 2020-10-30 NOTE — Progress Notes (Addendum)
STROKE TEAM PROGRESS NOTE   INTERVAL HISTORY  No family at bedside. Patient in bed, awake, alert, NAD. Oriented to all. . CT angio and cerebral angio suggests moderate left carotid stenosis but will check carotid ultrasound to confirm whether she needs revascularization especially since the location of her left stroke is subcortical and likely lacunar in nature.  Vitals:   10/29/20 2348 10/30/20 0413 10/30/20 0823 10/30/20 1201  BP: 107/60 124/80 129/79 131/71  Pulse: 65 74 73 71  Resp: 12 (!) 21 13 15   Temp: 98.2 F (36.8 C) 97.7 F (36.5 C) 97.7 F (36.5 C) 97.9 F (36.6 C)  TempSrc: Oral Oral Oral Oral  SpO2: 96% 99% 99% 100%  Weight:      Height:       CBC:  Recent Labs  Lab 10/25/20 0941 10/25/20 0948 10/29/20 0100 10/30/20 0226  WBC 6.0   < > 5.5 6.3  NEUTROABS 3.8  --   --   --   HGB 12.9   < > 12.2 13.0  HCT 37.6   < > 35.4* 37.4  MCV 96.4   < > 93.9 94.4  PLT 263   < > 268 274   < > = values in this interval not displayed.   Basic Metabolic Panel:  Recent Labs  Lab 10/25/20 0941 10/25/20 0948 10/29/20 0100 10/30/20 0226  NA 137   < > 135 136  K 4.4   < > 3.7 4.3  CL 104   < > 101 99  CO2 21*   < > 24 26  GLUCOSE 107*   < > 113* 112*  BUN 15   < > 11 18  CREATININE 0.96   < > 0.96 1.00  CALCIUM 9.6   < > 9.4 10.0  MG 2.1  --   --   --   PHOS 3.7  --   --   --    < > = values in this interval not displayed.   Lipid Panel:  Recent Labs  Lab 10/25/20 0941  CHOL 288*  TRIG 70  HDL 61  CHOLHDL 4.7  VLDL 14  LDLCALC 10/27/20*   HgbA1c:  Recent Labs  Lab 10/25/20 1113  HGBA1C 4.9   Urine Drug Screen:  Recent Labs  Lab 10/25/20 2033  LABOPIA NONE DETECTED  COCAINSCRNUR NONE DETECTED  LABBENZ NONE DETECTED  AMPHETMU NONE DETECTED  THCU NONE DETECTED  LABBARB NONE DETECTED    Alcohol Level  Recent Labs  Lab 10/25/20 0941  ETH <10    IMAGING past 24 hours No results found.  PHYSICAL EXAM  Temp:  [97.7 F (36.5 C)-98.2 F (36.8  C)] 97.9 F (36.6 C) (12/20 1201) Pulse Rate:  [65-78] 71 (12/20 1201) Resp:  [12-21] 15 (12/20 1201) BP: (101-164)/(60-82) 131/71 (12/20 1201) SpO2:  [94 %-100 %] 100 % (12/20 1201)  General - Well nourished, well developed, in no apparent distress.  Ophthalmologic - no ptosis, fundi not visualized PERRLA, EOEMI  Cardiovascular - Regular rhythm and rate.  Mental Status -  Awake, alert orientation to time, place, and person were intact. Language including comprehension and repetition intact.  Moderate dysarthria   Cranial Nerves II - XII - II - Visual field intact OU. III, IV, VI - Extraocular movements intact. V - Facial sensation intact bilaterally. VII - right facial droop VIII - Hearing & vestibular intact bilaterally. X - Palate elevates symmetrically.  Moderate dysarthria XI - Chin turning & shoulder shrug intact bilaterally. XII -  Tongue protrusion intact.  Motor Strength - The patient's strength was normal in left upper and lower extremities, however, right extremity 3/5 right lower extremity proximal 3-/5, knee flexion 3/5, ankle DF/PF 0/5.Marland Kitchen  Bulk was normal and fasciculations were absent.   Motor Tone - normal tone   Sensory - Light touch, temperature/pinprick were assessed and were decreased on the right lower extremity.    Coordination - FNF normal on left. FNF slow on right but able to perform.  Gait and Station - deferred.   ASSESSMENT/PLAN [name] is a 59 y.o. female with history of bilateral carotid stenosis, prior right carotid stent due to stroke presenting with left-sided weakness, ongoing tobacco abuse, sensorineural hearing loss bilateral ears presenting with a fall from R sided weakness. Received IV tPA 10/25/2020 at 1044 per wake-up stroke criteria w/ DWI mismatch. Emesis post tPA w/ neuro worsening. CT neg for hemorrhage.     Stroke:   L PLIC infarct s/p tPA secondary to likely. small vessel disease .CT and cerebral angiogram suggest  moderate left carotid stenosis likely incidental   code Stroke CT head No acute abnormality. ASPECTS 10.     MRI  L PLIC minimal FLAIR intensity  MRA  Motion degraded  Repeat CT head w/ neuro worsening No acute abnormality. Sinus dz.  MRI  L thalamocapsular jxn infarct slightly larger than earlier MRI  CTA head & neck left CCA proximal high-grade stenosis 60%, right ICA stent patent  Cerebral angiogram bilateral VA occlusion, left CCA 50 to 60% stenosis, right SCA 50%, left SCA 75% stenosis.  2D Echo EF 60-65%. No source of embolus. Moderate grade III transverse aortic plaque.  LDL 213  HgbA1c 4.9  UDS neg  VTE prophylaxis - SCDs   Aspirin 325 mg daily prior to admission, now on aspirin 325 mg daily and clopidogrel 75 mg daily after Plavix load.  Therapy recommendations: CIR  Disposition:  pending   Hx stroke/TIA  09/2018 - dysarthria, L facial droop, L sided weakness and sensory deficit admitted in [facility]. Treated w/ tPA.  CT head and neck showed right ICA thrombus status post R ICA stent placement.  MRI showed right MCA infarcts.  EF 60 to 65%.  A1c 4.7 LDL 106.  Discharged with DAPT and Lipitor 80.   Carotid Stenosis, bilalteral  S/p R CAS 09/2018 at [facility]  CT head and neck this time showed right carotid stent patent, however, left proximal CCA high-grade stenosis 60%  Cerebral angiogram showed left CCA 50 to 60% stenosis  Plan to have left CCA stenting next Tuesday.  However, if CIR placement before Tuesday, procedure can be postponed at the discharge of CIR.   Hyperlipidemia  Home meds:  None   Put on Lipitor 80   LDL 213, goal < 70  Continue statin at discharge  Tobacco abuse  Current smoker  Smoking cessation counseling provided  Pt is willing to quit  Other Stroke Risk Factors  Hx severe ETOH use, last nmtake 09/04/2020. alcohol level <10, advised to drink no more than 1 drink(s) a day  Substance abuse - Hx crack cocaine use   Family  hx stroke (distant "someone on her mother's side)  Other Active Problems, Findings and Recommendations  Anxiety on buspar, sertraline, trazodone  Nausea and emesis on zofran prn  Plan to have left CCA stenting next Tuesday.  However, if CIR placement before Tuesday, procedure can be postponed at the discharge of CIR.   Labs stable.  BP somewhat  low at times considering  Left internal carotid artery stenosis however pt is not on any scheduled anti-htn medications. She is currently getting NS at 50 mls / hr. Consider increasing IV fluids if BP continues to run low.  Hospital day # 5 Valentina Lucks, MSN, NP-C [facility] [phone] I have personally obtained history,examined this patient, reviewed notes, independently viewed imaging studies, participated in medical decision making and plan of care.ROS completed by me personally and pertinent positives fully documented  I have made any additions or clarifications directly to the above note. Agree with note above.     Plan check carotid ultrasound and if less than 50 to 60% stenosis will consider medical management and not to carotid stenting.  Transfer to rehab when bed available.  Greater than 50% time during this 25-minute visit was spent in counseling and coordination of care and discussion with care team about her stroke and carotid stenosis and plan for revascularization and answering questions. Delia Heady, MD   To contact Stroke Continuity provider, please refer to [email or web address]. After hours, contact General Neurology

## 2020-10-30 NOTE — Progress Notes (Signed)
Occupational Therapy Treatment Patient Details Name: Rebecca Sutton MRN: [id number] DOB: 1961/07/04 Today's Date: 10/30/2020    History of present illness Pt is 59 y.o. female admitted to ED On 12/15 with R weakness, facial droop, and fall. 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   OT comments  Pt seen in conjunction with PT to maximize pts activity tolerance. Pt continues to present with impaired balance, R sided weakness, and decreased activity tolerance, however pt making good progress towards OT goals with pt needing MIN A +2 for functional mobility from EOB>sink>recliner with HHA. Pt able to stand at sink with up to MOD A for standing balance. Pt able to complete standing grooming tasks from sink, education provided on using R hand as gross assist during BADLs. Pt completed [facility] therex as indicated below, issued pt handout to increase carryover as well as built up foam to use for utensils and ADL items to assist with functional grasp. Pt reports using LUE for self feeding however RUE with ~ 115 * of active elbow flexion. Pt remains a great CIR candidate. Will continue to follow acutely per POC.   Follow Up Recommendations  CIR    Equipment Recommendations  3 in 1 bedside commode    Recommendations for Other Services      Precautions / Restrictions Precautions Precautions: Fall Precaution Comments: R hemiparesis RUE>LE Restrictions Weight Bearing Restrictions: No       Mobility Bed Mobility Overal bed mobility: Needs Assistance Bed Mobility: Supine to Sit     Supine to sit: HOB elevated;Min guard     General bed mobility comments: increased time with min guard for lines  Transfers Overall transfer level: Needs assistance Equipment used: 2 person  hand held assist Transfers: Sit to/from Stand Sit to Stand: Min assist;+2 safety/equipment         General transfer comment: Performed sit to stand x 2 (for transfers) with min A of 2 for safety; cues for controlled descent    Balance Overall balance assessment: Needs assistance Sitting-balance support: Feet supported;No upper extremity supported Sitting balance-Leahy Scale: Good     Standing balance support: Bilateral upper extremity supported;Single extremity supported Standing balance-Leahy Scale: Poor Standing balance comment: Pt requiring UE support.  DId perform brushing teeth at sink in standing - pt requiring mod A at times due to leaning R                           ADL either performed or assessed with clinical judgement   ADL Overall ADL's : Needs assistance/impaired     Grooming: Oral care;Standing;Minimal assistance;Moderate assistance;Cueing for sequencing;Cueing for compensatory techniques Grooming Details (indicate cue type and reason): MIN - MOD A for standing balance, cues to incorporate RUE as gross assist             Lower Body Dressing: Total assistance;Bed level Lower Body Dressing Details (indicate cue type and reason): to don socks from bed level Toilet Transfer: Minimal assistance;+2 for physical assistance;Ambulation Toilet Transfer Details (indicate cue type and reason): simulated via functional mobility         Functional mobility during ADLs: Minimal assistance;+2 for physical assistance General ADL Comments: pt continue to present with R sided weakness, imparied balance, and decreased activity tolerance  Vision Baseline Vision/History: No visual deficits Patient Visual Report: No change from baseline     Perception     Praxis      Cognition Arousal/Alertness: Awake/alert Behavior During Therapy: WFL for tasks assessed/performed Overall Cognitive Status: Within Functional Limits for tasks assessed                                           Exercises Hand Exercises Wrist Flexion: AROM;Right;5 reps;Seated Wrist Extension: AROM;Right;5 reps;Seated Digit Composite Flexion: AROM;Right;5 reps;Seated Composite Extension: AROM;Right;5 reps;Seated Digit Composite Abduction: AROM;Right;Seated;5 reps Digit Composite Adduction: AROM;Right;Seated;5 reps Digit Lifts: AROM;Right;Seated;5 reps Opposition: AROM;Right;Seated;5 reps Other Exercises Other Exercises: issued pt red foam at assist with grip stregth from self feeding ADL participation Other Exercises: issued pt [facility] handout, encouraged elbow flexion/ extension with RUE as well as using RUE as gross assist and working on manipulating small items   Shoulder Instructions       General Comments      Pertinent Vitals/ Pain       Pain Assessment: No/denies pain  Home Living                                          Prior Functioning/Environment              Frequency  Min 2X/week        Progress Toward Goals  OT Goals(current goals can now be found in the care plan section)  Progress towards OT goals: Progressing toward goals  Acute Rehab OT Goals Patient Stated Goal: to go home to take care of cats OT Goal Formulation: With patient Time For Goal Achievement: 11/09/20 (noted OTR mistake and updated to appropriate goal achievement date based on date of eval) Potential to Achieve Goals: Good  Plan Discharge plan remains appropriate;Frequency remains appropriate    Co-evaluation      Reason for Co-Treatment: For patient/therapist safety;To address functional/ADL transfers PT goals addressed during session: Mobility/safety with mobility OT goals addressed during session: ADL's and self-care;Strengthening/ROM      AM-PAC OT "6 Clicks" Daily Activity     Outcome Measure   Help from another person eating meals?: A Little Help from another person taking care of personal grooming?: A Little Help from  another person toileting, which includes using toliet, bedpan, or urinal?: A Lot Help from another person bathing (including washing, rinsing, drying)?: A Lot Help from another person to put on and taking off regular upper body clothing?: A Lot Help from another person to put on and taking off regular lower body clothing?: A Lot 6 Click Score: 14    End of Session Equipment Utilized During Treatment: Gait belt  OT Visit Diagnosis: Hemiplegia and hemiparesis Hemiplegia - Right/Left: Right Hemiplegia - dominant/non-dominant: Dominant Hemiplegia - caused by: Cerebral infarction   Activity Tolerance Patient tolerated treatment well   Patient Left in chair;with call bell/phone within reach;with chair alarm set   Nurse Communication Mobility status;Other (comment) (ants in pts room on sink)        Time: 4650-3546 OT Time Calculation (min): 30 min  Charges: OT General Charges $OT Visit: 1 Visit OT Treatments $Self Care/Home Management : 8-22 mins  Audery Amel., COTA/L Acute Rehabilitation Services [phone]    Angelina Pih 10/30/2020, 4:35 PM

## 2020-10-30 NOTE — Plan of Care (Signed)
?  Problem: Health Behavior/Discharge Planning: ?Goal: Ability to manage health-related needs will improve ?Outcome: Progressing ?  ?Problem: Clinical Measurements: ?Goal: Ability to maintain clinical measurements within normal limits will improve ?Outcome: Progressing ?  ?Problem: Activity: ?Goal: Risk for activity intolerance will decrease ?Outcome: Progressing ?  ?Problem: Elimination: ?Goal: Will not experience complications related to bowel motility ?Outcome: Progressing ?

## 2020-10-31 LAB — GLUCOSE, CAPILLARY
Glucose-Capillary: 109 mg/dL — ABNORMAL HIGH (ref 70–99)
Glucose-Capillary: 115 mg/dL — ABNORMAL HIGH (ref 70–99)

## 2020-10-31 LAB — BASIC METABOLIC PANEL
Anion gap: 12 (ref 5–15)
BUN: 13 mg/dL (ref 6–20)
CO2: 26 mmol/L (ref 22–32)
Calcium: 9.9 mg/dL (ref 8.9–10.3)
Chloride: 96 mmol/L — ABNORMAL LOW (ref 98–111)
Creatinine, Ser: 0.97 mg/dL (ref 0.44–1.00)
GFR, Estimated: >60 mL/min (ref >60)
Glucose, Bld: 99 mg/dL (ref 70–99)
Potassium: 4 mmol/L (ref 3.5–5.1)
Sodium: 134 mmol/L — ABNORMAL LOW (ref 135–145)

## 2020-10-31 LAB — CBC WITH DIFFERENTIAL/PLATELET
Abs Immature Granulocytes: 0.02 10*3/uL (ref 0.00–0.07)
Basophils Absolute: 0 10*3/uL (ref 0.0–0.1)
Basophils Relative: 0 %
Eosinophils Absolute: 0.4 10*3/uL (ref 0.0–0.5)
Eosinophils Relative: 5 %
HCT: 34.4 % — ABNORMAL LOW (ref 36.0–46.0)
Hemoglobin: 12.4 g/dL (ref 12.0–15.0)
Immature Granulocytes: 0 %
Lymphocytes Relative: 19 %
Lymphs Abs: 1.5 10*3/uL (ref 0.7–4.0)
MCH: 33.3 pg (ref 26.0–34.0)
MCHC: 36 g/dL (ref 30.0–36.0)
MCV: 92.5 fL (ref 80.0–100.0)
Monocytes Absolute: 0.9 10*3/uL (ref 0.1–1.0)
Monocytes Relative: 11 %
Neutro Abs: 5.1 10*3/uL (ref 1.7–7.7)
Neutrophils Relative %: 65 %
Platelets: 278 10*3/uL (ref 150–400)
RBC: 3.72 MIL/uL — ABNORMAL LOW (ref 3.87–5.11)
RDW: 12 % (ref 11.5–15.5)
WBC: 7.9 10*3/uL (ref 4.0–10.5)
nRBC: 0 % (ref 0.0–0.2)

## 2020-10-31 NOTE — Progress Notes (Signed)
Physical Therapy Treatment Patient Details Name: Rebecca Sutton MRN: [id number] DOB: [birth year] Today's Date: 10/31/2020    History of Present Illness Pt is 59 y.o. female admitted to ED On 12/15 with R weakness, facial droop, and fall. 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    PT Comments    Patient highly motivated and worked hard during session. Session focused on RLE strengthening, pre-gait and gait training. Patient fatigued by end of session with requiring mod assist to prevent rt knee buckling on return from sink to recliner.    Follow Up Recommendations  CIR;Supervision/Assistance - 24 hour     Equipment Recommendations  None recommended by PT    Recommendations for Other Services       Precautions / Restrictions Precautions Precautions: Fall Precaution Comments: R hemiparesis RUE>LE    Mobility  Bed Mobility Overal bed mobility: Needs Assistance Bed Mobility: Supine to Sit     Supine to sit: HOB elevated;Supervision     General bed mobility comments: increased time with supervision for lines  Transfers Overall transfer level: Needs assistance Equipment used: 1 person hand held assist Transfers: Sit to/from Stand Sit to Stand: Min assist         General transfer comment: Repeated x 5 for RLE strengthening (using RUE only to assist forcing use of RLE)  Ambulation/Gait Ambulation/Gait assistance: Min assist;Mod assist Gait Distance (Feet): 6 Feet (standing activities at sink, fatigued and mod assist to walk 4 ft to recliner) Assistive device: 1 person hand held assist (at end, Lt HHA and RUE on counter) Gait Pattern/deviations: Step-to pattern;Decreased stance time - right;Decreased weight shift to right;Decreased dorsiflexion -  right Gait velocity: decreased   General Gait Details: Ambulated to sink with Lt HHA with no rt knee buckling, cues for knee extension. As pt fatigued, R knee with mild buckling as walking sink to recliner.   Stairs             Wheelchair Mobility    Modified Rankin (Stroke Patients Only) Modified Rankin (Stroke Patients Only) Pre-Morbid Rankin Score: No symptoms Modified Rankin: Moderately severe disability     Balance Overall balance assessment: Needs assistance Sitting-balance support: Feet supported;No upper extremity supported Sitting balance-Leahy Scale: Good Sitting balance - Comments: able to reach to lt foot to pull up her sock   Standing balance support: Single extremity supported Standing balance-Leahy Scale: Poor                              Cognition Arousal/Alertness: Awake/alert Behavior During Therapy: WFL for tasks assessed/performed Overall Cognitive Status: Within Functional Limits for tasks assessed                                 General Comments: grossly intact      Exercises Other Exercises Other Exercises: Rt hand on counter beside her with stepping RLE forward and backward with emphasis on loading RLE and extending rt knee; attempted advancing Left foot and by then rt knee too tired and beginning to partial buckle. Attempted to block rt knee, however pt then hyperextending rt knee    General Comments        Pertinent Vitals/Pain Pain Assessment: No/denies pain  Faces Pain Scale: No hurt    Home Living                      Prior Function            PT Goals (current goals can now be found in the care plan section) Acute Rehab PT Goals Patient Stated Goal: to go home to take care of cats Time For Goal Achievement: 11/09/20 Potential to Achieve Goals: Good Progress towards PT goals: Progressing toward goals    Frequency    Min 4X/week      PT Plan Current plan remains appropriate     Co-evaluation              AM-PAC PT "6 Clicks" Mobility   Outcome Measure  Help needed turning from your back to your side while in a flat bed without using bedrails?: A Little Help needed moving from lying on your back to sitting on the side of a flat bed without using bedrails?: A Little Help needed moving to and from a bed to a chair (including a wheelchair)?: A Lot Help needed standing up from a chair using your arms (e.g., wheelchair or bedside chair)?: A Little Help needed to walk in hospital room?: A Lot Help needed climbing 3-5 steps with a railing? : Total 6 Click Score: 14    End of Session Equipment Utilized During Treatment: Gait belt Activity Tolerance: Patient tolerated treatment well;Patient limited by fatigue Patient left: in chair;with call bell/phone within reach;with chair alarm set   PT Visit Diagnosis: Hemiplegia and hemiparesis;Other abnormalities of gait and mobility (R26.89) Hemiplegia - Right/Left: Right Hemiplegia - dominant/non-dominant: Dominant Hemiplegia - caused by: Cerebral infarction     Time: 7341-9379 PT Time Calculation (min) (ACUTE ONLY): 25 min  Charges:  $Gait Training: 8-22 mins $Neuromuscular Re-education: 8-22 mins                      Jerolyn Center, PT Pager [phone]    Zena Amos 10/31/2020, 5:11 PM

## 2020-10-31 NOTE — Progress Notes (Signed)
Inpatient Rehab Admissions Coordinator:   I have no beds available for this patient to admit to CIR today.  Will continue to follow for timing of potential admission pending bed availability.   Estill Dooms, PT, DPT Admissions Coordinator [phone] 10/31/20  11:27 AM

## 2020-10-31 NOTE — Progress Notes (Addendum)
  Speech Language Pathology Treatment: Dysphagia  Patient Details Name: Rebecca Sutton MRN: [id number] DOB: 06/04/1961 Today's Date: 10/31/2020 Time: 4008-6761 SLP Time Calculation (min) (ACUTE ONLY): 10 min  Assessment / Plan / Recommendation Clinical Impression  Pt was received seated upright in bed awake and alert, agreeable to participating in therapy.  Pt reports good toleration of regular textures and thin liquids and demonstrated mod I use of swallowing precautions to clear buccal residue during a functional snack.  No overt s/s of aspiration were evident with solids or liquids.  Pt reports there are certain foods she is still avoiding (sandwiches) because she doesn't feel ready to eat them yet but is otherwise pleased with her progress.  Pt was left in bed with bed alarm set and call bell within reach.  Continue per current plan of care.    HPI HPI: Rebecca Sutton is a 59 y.o. female with a past medical history significant for bilateral carotid stenosis, prior right carotid stent due to stroke presenting with left-sided weakness, ongoing tobacco abuse, sensorineural hearing loss bilateral ears. MRI 12/15: "Acute infarct posterior limb internal capsule on the left with  minimal FLAIR signal intensity"  Pt s/p tPA      SLP Plan  Continue with current plan of care       Recommendations  Diet recommendations: Regular;Thin liquid Liquids provided via: Cup;Straw Medication Administration: Whole meds with liquid Supervision: Patient able to self feed Compensations: Slow rate;Small sips/bites;Lingual sweep for clearance of pocketing Postural Changes and/or Swallow Maneuvers: Seated upright 90 degrees                Oral Care Recommendations: Oral care BID Follow up Recommendations: Inpatient Rehab SLP Visit Diagnosis: Dysphagia, oral phase (R13.11) Plan: Continue with current plan of care       GO                Phaedra Colgate, Melanee Spry 10/31/2020, 4:30 PM

## 2020-10-31 NOTE — Progress Notes (Addendum)
STROKE TEAM PROGRESS NOTE   INTERVAL HISTORY  No family at bedside. Patient in bed, awake, alert, NAD. Oriented to all. No neuro changes. Plan for CIR when bed available.  Carotid ultrasound showed only mild 1-39% left ICA stenosis hence left carotid stent is not necessary.  Discussed with Dr. Corliss Skainseveshwar who agrees with plan Vitals:   10/31/20 0314 10/31/20 0726 10/31/20 1200 10/31/20 1205  BP: 107/69 116/66 117/68   Pulse: 71 73 79   Resp: 14 18 19    Temp: 98.1 F (36.7 C) 98.1 F (36.7 C) 98 F (36.7 C)   TempSrc: Oral Oral Oral   SpO2: 98% 99% 98% 98%  Weight:      Height:       CBC:  Recent Labs  Lab 10/25/20 0941 10/25/20 0948 10/30/20 0226 10/31/20 0502  WBC 6.0   < > 6.3 7.9  NEUTROABS 3.8  --   --  5.1  HGB 12.9   < > 13.0 12.4  HCT 37.6   < > 37.4 34.4*  MCV 96.4   < > 94.4 92.5  PLT 263   < > 274 278   < > = values in this interval not displayed.   Basic Metabolic Panel:  Recent Labs  Lab 10/25/20 0941 10/25/20 0948 10/30/20 0226 10/31/20 0157  NA 137   < > 136 134*  K 4.4   < > 4.3 4.0  CL 104   < > 99 96*  CO2 21*   < > 26 26  GLUCOSE 107*   < > 112* 99  BUN 15   < > 18 13  CREATININE 0.96   < > 1.00 0.97  CALCIUM 9.6   < > 10.0 9.9  MG 2.1  --   --   --   PHOS 3.7  --   --   --    < > = values in this interval not displayed.   Lipid Panel:  Recent Labs  Lab 10/25/20 0941  CHOL 288*  TRIG 70  HDL 61  CHOLHDL 4.7  VLDL 14  LDLCALC 161213*   HgbA1c:  Recent Labs  Lab 10/25/20 1113  HGBA1C 4.9   Urine Drug Screen:  Recent Labs  Lab 10/25/20 2033  LABOPIA NONE DETECTED  COCAINSCRNUR NONE DETECTED  LABBENZ NONE DETECTED  AMPHETMU NONE DETECTED  THCU NONE DETECTED  LABBARB NONE DETECTED    Alcohol Level  Recent Labs  Lab 10/25/20 0941  ETH <10    IMAGING past 24 hours VAS US CAROTID  Result Date: 10/30/2020 Carotid Arterial Duplex Study Indications:       CVA. Risk Factors:      Current smoker, prior CVA. Other Factors:      ETOH abuse. Comparison Study:  No previous Exam Performing Technologist: Clint GuyLisa Gibson RVT  Examination Guidelines: A complete evaluation includes B-mode imaging, spectral Doppler, color Doppler, and power Doppler as needed of all accessible portions of each vessel. Bilateral testing is considered an integral part of a complete examination. Limited examinations for reoccurring indications may be performed as noted.  Right Carotid Findings: +----------+--------+--------+--------+------------------+--------+           PSV cm/sEDV cm/sStenosisPlaque DescriptionComments +----------+--------+--------+--------+------------------+--------+ CCA Prox  326     28      >50%    heterogenous               +----------+--------+--------+--------+------------------+--------+ CCA Distal137     29                                         +----------+--------+--------+--------+------------------+--------+  ICA Prox  159     24      1-39%   heterogenous               +----------+--------+--------+--------+------------------+--------+ ICA Mid   149     36                                         +----------+--------+--------+--------+------------------+--------+ ICA Distal147     31                                         +----------+--------+--------+--------+------------------+--------+ ECA       288     47      >50%                               +----------+--------+--------+--------+------------------+--------+ +----------+--------+-------+--------+-------------------+           PSV cm/sEDV cmsDescribeArm Pressure (mmHG) +----------+--------+-------+--------+-------------------+ [id number]     79     Stenotic                    +----------+--------+-------+--------+-------------------+ +---------+--------+--+--------+--+---------+ VertebralPSV cm/s55EDV cm/s17Antegrade +---------+--------+--+--------+--+---------+  Left Carotid Findings:  +----------+--------+--------+--------+------------------+--------+           PSV cm/sEDV cm/sStenosisPlaque DescriptionComments +----------+--------+--------+--------+------------------+--------+ CCA Prox  144     21                                         +----------+--------+--------+--------+------------------+--------+ CCA Distal180     34                                         +----------+--------+--------+--------+------------------+--------+ ICA Prox  111     24      1-39%   heterogenous               +----------+--------+--------+--------+------------------+--------+ ICA Mid   141     26                                         +----------+--------+--------+--------+------------------+--------+ ICA Distal132     23                                         +----------+--------+--------+--------+------------------+--------+ ECA       55      17                                         +----------+--------+--------+--------+------------------+--------+ +----------+--------+--------+--------+-------------------+           PSV cm/sEDV cm/sDescribeArm Pressure (mmHG) +----------+--------+--------+--------+-------------------+ [id number]             Stenotic                    +----------+--------+--------+--------+-------------------+ +---------+--------+--+--------+---------------------+ VertebralPSV cm/s25EDV cm/sAntegrade and blunted +---------+--------+--+--------+---------------------+  Summary: Right Carotid: Velocities in the right ICA are consistent with a 1-39% stenosis.                Hemodynamically significant plaque >50% visualized in the CCA.                The ECA appears >50% stenosed. Left Carotid: Velocities in the left ICA are consistent with a 1-39% stenosis.               The ECA appears >50% stenosed. ECA blunted. Vertebrals:  Bilateral vertebral arteries demonstrate antegrade flow. Left is              blunted.  Subclavians: Bilateral subclavian arteries were stenotic. *See table(s) above for measurements and observations.  Electronically signed by Waverly Ferrari MD on 10/30/2020 at 4:34:16 PM.    Final     PHYSICAL EXAM  Temp:  [98 F (36.7 C)-98.2 F (36.8 C)] 98 F (36.7 C) (12/21 1200) Pulse Rate:  [70-89] 79 (12/21 1200) Resp:  [14-19] 19 (12/21 1200) BP: (107-152)/(53-86) 117/68 (12/21 1200) SpO2:  [96 %-99 %] 98 % (12/21 1205)  General - Well nourished, well developed, in no apparent distress.  Ophthalmologic - no ptosis, fundi not visualized PERRLA, EOEMI  Cardiovascular - Regular rhythm and rate.  Mental Status -  Awake, alert orientation to time, place, and person were intact. Language including comprehension and repetition intact.  Moderate dysarthria   Cranial Nerves II - XII - II - Visual field intact OU. III, IV, VI - Extraocular movements intact. V - Facial sensation intact bilaterally. VII - right facial droop VIII - Hearing & vestibular intact bilaterally. X - Palate elevates symmetrically.  Moderate dysarthria XI - Chin turning & shoulder shrug intact bilaterally. XII - Tongue protrusion intact.  Motor Strength - The patient's strength was normal in left upper and lower extremities, however, right extremity 3/5 right lower extremity proximal 3-/5, knee flexion 3/5, ankle DF/PF 0/5.Marland Kitchen  Bulk was normal and fasciculations were absent.   Motor Tone - normal tone   Sensory - Light touch, temperature/pinprick were assessed and were decreased on the right lower extremity.    Coordination - FNF normal on left. FNF slow on right but able to perform.  Gait and Station - deferred.   ASSESSMENT/PLAN [name] is a 59 y.o. female with history of bilateral carotid stenosis, prior right carotid stent due to stroke presenting with left-sided weakness, ongoing tobacco abuse, sensorineural hearing loss bilateral ears presenting with a fall from R sided weakness.  Received IV tPA 10/25/2020 at 1044 per wake-up stroke criteria w/ DWI mismatch. Emesis post tPA w/ neuro worsening. CT neg for hemorrhage.     Stroke:   L PLIC infarct s/p tPA secondary to likely. small vessel disease .CT and cerebral angiogram suggest moderate left carotid stenosis likely incidental   code Stroke CT head No acute abnormality. ASPECTS 10.     MRI  L PLIC minimal FLAIR intensity  MRA  Motion degraded  Repeat CT head w/ neuro worsening No acute abnormality. Sinus dz.  MRI  L thalamocapsular jxn infarct slightly larger than earlier MRI  CTA head & neck left CCA proximal high-grade stenosis 60%, right ICA stent patent  Cerebral angiogram bilateral VA occlusion, left CCA 50 to 60% stenosis, right SCA 50%, left SCA 75% stenosis.  2D Echo EF 60-65%. No source of embolus. Moderate grade III transverse aortic plaque.  LDL 213  HgbA1c 4.9  UDS neg  VTE prophylaxis - SCDs   Aspirin 325 mg daily prior to admission, now on aspirin 325 mg daily and clopidogrel 75 mg daily after Plavix load.  Therapy recommendations: CIR  Disposition:  pending   Hx stroke/TIA  09/2018 - dysarthria, L facial droop, L sided weakness and sensory deficit admitted in [facility]. Treated w/ tPA.  CT head and neck showed right ICA thrombus status post R ICA stent placement.  MRI showed right MCA infarcts.  EF 60 to 65%.  A1c 4.7 LDL 106.  Discharged with DAPT and Lipitor 80.   Carotid Stenosis, bilalteral  S/p R CAS 09/2018 at [facility]  CT head and neck this time showed right carotid stent patent, however, left proximal CCA high-grade stenosis 60%  Cerebral angiogram showed left CCA 50 to 60% stenosis  Plan to have left CCA stenting next Tuesday.  However, if CIR placement before Tuesday, procedure can be postponed at the discharge of CIR.   Hyperlipidemia  Home meds:  None   Put on Lipitor 80   LDL 213, goal < 70  Continue statin at discharge  Tobacco abuse  Current  smoker  Smoking cessation counseling provided  Pt is willing to quit  Other Stroke Risk Factors  Hx severe ETOH use, last nmtake 09/04/2020. alcohol level <10, advised to drink no more than 1 drink(s) a day  Substance abuse - Hx crack cocaine use   Family hx stroke (distant "someone on her mother's side)  Other Active Problems, Findings and Recommendations  Anxiety on buspar, sertraline, trazodone  Nausea and emesis on zofran prn  Plan to have left CCA stenting next Tuesday.  However, if CIR placement before Tuesday, procedure can be postponed at the discharge of CIR.   Labs stable.  BP somewhat low at times considering  Left internal carotid artery stenosis however pt is not on any scheduled anti-htn medications. She is currently getting NS at 50 mls / hr. Consider increasing IV fluids if BP continues to run low.  Hospital day # 6 Valentina Lucks, MSN, NP-C [facility] [phone] I have personally obtained history,examined this patient, reviewed notes, independently viewed imaging studies, participated in medical decision making and plan of care.ROS completed by me personally and pertinent positives fully documented  I have made any additions or clarifications directly to the above note. Agree with note above.  Carotid ultrasound showed only mild left ICA stenosis hence patient does not need revascularization consideration at the present time.  Discussed with Dr. Corliss Skains neuro IR.  Continue ongoing treatment.  Patient medically stable to be transferred to inpatient rehab when bed available hopefully over the next few days.  Discussed with patient and daughter and answered questions.  Greater than 50% time during this 25-minute visit was spent on counseling and coordination of care for the stroke and answering questions discussion with care team  Delia Heady, MD Medical Director [facility] Stroke Center Pager: [phone] 10/31/2020 4:02 PM    To contact  Stroke Continuity provider, please refer to [email or web address]. After hours, contact General Neurology

## 2020-11-01 ENCOUNTER — Other Ambulatory Visit: Payer: Self-pay

## 2020-11-01 ENCOUNTER — Encounter (HOSPITAL_COMMUNITY): Payer: Self-pay | Admitting: Physical Medicine & Rehabilitation

## 2020-11-01 ENCOUNTER — Inpatient Hospital Stay (HOSPITAL_COMMUNITY)
Admission: RE | Admit: 2020-11-01 | Discharge: 2020-11-15 | DRG: 057 | Disposition: A | Discharge status: Home Health | Payer: Medicaid Other | Source: Intra-hospital | Attending: Physical Medicine & Rehabilitation | Admitting: Physical Medicine & Rehabilitation

## 2020-11-01 DIAGNOSIS — G2581 Restless legs syndrome: Secondary | ICD-10-CM

## 2020-11-01 DIAGNOSIS — Z79899 Other long term (current) drug therapy: Secondary | ICD-10-CM

## 2020-11-01 DIAGNOSIS — Z8673 Personal history of transient ischemic attack (TIA), and cerebral infarction without residual deficits: Secondary | ICD-10-CM

## 2020-11-01 DIAGNOSIS — E871 Hypo-osmolality and hyponatremia: Secondary | ICD-10-CM | POA: Diagnosis present

## 2020-11-01 DIAGNOSIS — N390 Urinary tract infection, site not specified: Secondary | ICD-10-CM | POA: Diagnosis not present

## 2020-11-01 DIAGNOSIS — I129 Hypertensive chronic kidney disease with stage 1 through stage 4 chronic kidney disease, or unspecified chronic kidney disease: Secondary | ICD-10-CM | POA: Diagnosis present

## 2020-11-01 DIAGNOSIS — Z825 Family history of asthma and other chronic lower respiratory diseases: Secondary | ICD-10-CM

## 2020-11-01 DIAGNOSIS — I639 Cerebral infarction, unspecified: Secondary | ICD-10-CM | POA: Diagnosis present

## 2020-11-01 DIAGNOSIS — N179 Acute kidney failure, unspecified: Secondary | ICD-10-CM | POA: Diagnosis not present

## 2020-11-01 DIAGNOSIS — Z91013 Allergy to seafood: Secondary | ICD-10-CM

## 2020-11-01 DIAGNOSIS — I69391 Dysphagia following cerebral infarction: Secondary | ICD-10-CM

## 2020-11-01 DIAGNOSIS — G8191 Hemiplegia, unspecified affecting right dominant side: Secondary | ICD-10-CM

## 2020-11-01 DIAGNOSIS — G4700 Insomnia, unspecified: Secondary | ICD-10-CM | POA: Diagnosis present

## 2020-11-01 DIAGNOSIS — F17210 Nicotine dependence, cigarettes, uncomplicated: Secondary | ICD-10-CM | POA: Diagnosis present

## 2020-11-01 DIAGNOSIS — R739 Hyperglycemia, unspecified: Secondary | ICD-10-CM | POA: Diagnosis present

## 2020-11-01 DIAGNOSIS — R1310 Dysphagia, unspecified: Secondary | ICD-10-CM | POA: Diagnosis present

## 2020-11-01 DIAGNOSIS — I69351 Hemiplegia and hemiparesis following cerebral infarction affecting right dominant side: Secondary | ICD-10-CM | POA: Diagnosis present

## 2020-11-01 DIAGNOSIS — R29810 Facial weakness: Secondary | ICD-10-CM | POA: Diagnosis present

## 2020-11-01 DIAGNOSIS — I69393 Ataxia following cerebral infarction: Secondary | ICD-10-CM | POA: Diagnosis not present

## 2020-11-01 DIAGNOSIS — F419 Anxiety disorder, unspecified: Secondary | ICD-10-CM | POA: Diagnosis present

## 2020-11-01 DIAGNOSIS — I2510 Atherosclerotic heart disease of native coronary artery without angina pectoris: Secondary | ICD-10-CM | POA: Diagnosis present

## 2020-11-01 DIAGNOSIS — I69322 Dysarthria following cerebral infarction: Secondary | ICD-10-CM | POA: Diagnosis not present

## 2020-11-01 DIAGNOSIS — F411 Generalized anxiety disorder: Secondary | ICD-10-CM

## 2020-11-01 DIAGNOSIS — E785 Hyperlipidemia, unspecified: Secondary | ICD-10-CM | POA: Diagnosis present

## 2020-11-01 DIAGNOSIS — I69392 Facial weakness following cerebral infarction: Secondary | ICD-10-CM

## 2020-11-01 DIAGNOSIS — Z833 Family history of diabetes mellitus: Secondary | ICD-10-CM

## 2020-11-01 DIAGNOSIS — I6381 Other cerebral infarction due to occlusion or stenosis of small artery: Secondary | ICD-10-CM | POA: Diagnosis present

## 2020-11-01 DIAGNOSIS — Z818 Family history of other mental and behavioral disorders: Secondary | ICD-10-CM

## 2020-11-01 DIAGNOSIS — I959 Hypotension, unspecified: Secondary | ICD-10-CM | POA: Diagnosis not present

## 2020-11-01 DIAGNOSIS — H905 Unspecified sensorineural hearing loss: Secondary | ICD-10-CM | POA: Diagnosis present

## 2020-11-01 DIAGNOSIS — Z8249 Family history of ischemic heart disease and other diseases of the circulatory system: Secondary | ICD-10-CM | POA: Diagnosis not present

## 2020-11-01 DIAGNOSIS — I69320 Aphasia following cerebral infarction: Secondary | ICD-10-CM

## 2020-11-01 DIAGNOSIS — N182 Chronic kidney disease, stage 2 (mild): Secondary | ICD-10-CM | POA: Diagnosis present

## 2020-11-01 DIAGNOSIS — F1010 Alcohol abuse, uncomplicated: Secondary | ICD-10-CM | POA: Diagnosis present

## 2020-11-01 DIAGNOSIS — F32A Depression, unspecified: Secondary | ICD-10-CM | POA: Diagnosis present

## 2020-11-01 LAB — GLUCOSE, CAPILLARY
Glucose-Capillary: 109 mg/dL — ABNORMAL HIGH (ref 70–99)
Glucose-Capillary: 101 mg/dL — ABNORMAL HIGH (ref 70–99)
Glucose-Capillary: 139 mg/dL — ABNORMAL HIGH (ref 70–99)

## 2020-11-01 MED ORDER — TRAZODONE HCL 50 MG PO TABS: 25.0000 mg | ORAL_TABLET | Freq: Every evening | ORAL | Status: DC | PRN

## 2020-11-01 MED ORDER — GUAIFENESIN-DM 100-10 MG/5ML PO SYRP: 5.0000 mL | ORAL_SOLUTION | Freq: Four times a day (QID) | ORAL | Status: DC | PRN

## 2020-11-01 MED ORDER — ACETAMINOPHEN 325 MG PO TABS
650.0000 mg | ORAL_TABLET | ORAL | Status: DC | PRN
  Administered 2020-11-05 – 2020-11-10 (×6): 650 mg via ORAL
  Filled 2020-11-01 (×6): qty 2

## 2020-11-01 MED ORDER — POLYETHYLENE GLYCOL 3350 17 G PO PACK
17.0000 g | PACK | Freq: Every day | ORAL | Status: DC | PRN
  Filled 2020-11-01: qty 1

## 2020-11-01 MED ORDER — ENOXAPARIN SODIUM 40 MG/0.4ML ~~LOC~~ SOLN: 40.0000 mg | SUBCUTANEOUS | Status: DC

## 2020-11-01 MED ORDER — PANTOPRAZOLE SODIUM 40 MG PO TBEC
40.0000 mg | DELAYED_RELEASE_TABLET | Freq: Every day | ORAL | Status: DC
  Administered 2020-11-02 – 2020-11-15 (×14): 40 mg via ORAL
  Filled 2020-11-01 (×14): qty 1

## 2020-11-01 MED ORDER — ACETAMINOPHEN 160 MG/5ML PO SOLN: 650.0000 mg | ORAL | Status: DC | PRN

## 2020-11-01 MED ORDER — FLEET ENEMA 7-19 GM/118ML RE ENEM: 1.0000 | ENEMA | Freq: Once | RECTAL | Status: DC | PRN

## 2020-11-01 MED ORDER — ACETAMINOPHEN 325 MG PO TABS: 325.0000 mg | ORAL_TABLET | ORAL | Status: DC | PRN

## 2020-11-01 MED ORDER — CLOPIDOGREL BISULFATE 75 MG PO TABS
75.0000 mg | ORAL_TABLET | Freq: Every day | ORAL | Status: DC
  Administered 2020-11-02 – 2020-11-15 (×14): 75 mg via ORAL
  Filled 2020-11-01 (×14): qty 1

## 2020-11-01 MED ORDER — ENOXAPARIN SODIUM 40 MG/0.4ML ~~LOC~~ SOLN
40.0000 mg | SUBCUTANEOUS | Status: DC
  Administered 2020-11-01 – 2020-11-05 (×5): 40 mg via SUBCUTANEOUS
  Filled 2020-11-01 (×5): qty 0.4

## 2020-11-01 MED ORDER — ALUM & MAG HYDROXIDE-SIMETH 200-200-20 MG/5ML PO SUSP: 30.0000 mL | ORAL | Status: DC | PRN

## 2020-11-01 MED ORDER — SERTRALINE HCL 50 MG PO TABS
100.0000 mg | ORAL_TABLET | Freq: Every day | ORAL | Status: DC
  Administered 2020-11-01 – 2020-11-14 (×14): 100 mg via ORAL
  Filled 2020-11-01 (×14): qty 2

## 2020-11-01 MED ORDER — ATORVASTATIN CALCIUM 80 MG PO TABS
80.0000 mg | ORAL_TABLET | Freq: Every day | ORAL | Status: DC
  Administered 2020-11-02 – 2020-11-15 (×14): 80 mg via ORAL
  Filled 2020-11-01 (×14): qty 1

## 2020-11-01 MED ORDER — PROCHLORPERAZINE 25 MG RE SUPP: 12.5000 mg | Freq: Four times a day (QID) | RECTAL | Status: DC | PRN

## 2020-11-01 MED ORDER — ASPIRIN EC 81 MG PO TBEC
81.0000 mg | DELAYED_RELEASE_TABLET | Freq: Every day | ORAL | Status: DC
  Administered 2020-11-02 – 2020-11-15 (×14): 81 mg via ORAL
  Filled 2020-11-01 (×14): qty 1

## 2020-11-01 MED ORDER — BUSPIRONE HCL 5 MG PO TABS
10.0000 mg | ORAL_TABLET | Freq: Two times a day (BID) | ORAL | Status: DC
  Administered 2020-11-01 – 2020-11-15 (×28): 10 mg via ORAL
  Filled 2020-11-01 (×28): qty 2

## 2020-11-01 MED ORDER — PROCHLORPERAZINE EDISYLATE 10 MG/2ML IJ SOLN: 5.0000 mg | Freq: Four times a day (QID) | INTRAMUSCULAR | Status: DC | PRN

## 2020-11-01 MED ORDER — BISACODYL 10 MG RE SUPP
10.0000 mg | Freq: Every day | RECTAL | Status: DC | PRN
Start: 2020-11-01 — End: 2020-11-15

## 2020-11-01 MED ORDER — DIPHENHYDRAMINE HCL 12.5 MG/5ML PO ELIX: 12.5000 mg | ORAL_SOLUTION | Freq: Four times a day (QID) | ORAL | Status: DC | PRN

## 2020-11-01 MED ORDER — ACETAMINOPHEN 650 MG RE SUPP: 650.0000 mg | RECTAL | Status: DC | PRN

## 2020-11-01 MED ORDER — PROCHLORPERAZINE MALEATE 5 MG PO TABS: 5.0000 mg | ORAL_TABLET | Freq: Four times a day (QID) | ORAL | Status: DC | PRN

## 2020-11-01 MED ORDER — TRAZODONE HCL 50 MG PO TABS
75.0000 mg | ORAL_TABLET | Freq: Every day | ORAL | Status: DC
  Administered 2020-11-01 – 2020-11-14 (×14): 75 mg via ORAL
  Filled 2020-11-01 (×14): qty 2

## 2020-11-01 NOTE — Discharge Summary (Addendum)
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 CAD (coronary artery disease)   . Carotid stenosis    bilateral  . CVA (cerebral vascular accident) (HCC) 2019  . H/O ETOH abuse 08/2020  . Smoking trying to quit    1/2 PPD  . Substance abuse (HCC)    last used 20 years ago   Past Surgical History:  Procedure Laterality Date  . IR ANGIO INTRA EXTRACRAN SEL COM CAROTID INNOMINATE BILAT MOD SED  10/27/2020  . IR ANGIO VERTEBRAL SEL SUBCLAVIAN INNOMINATE BILAT MOD SED  10/27/2020    HOSPITAL MEDICATIONS . aspirin EC  81 mg Oral Daily  . atorvastatin  80 mg Oral Daily  . busPIRone  10 mg Oral BID  . clopidogrel  75 mg Oral Daily  . enoxaparin (LOVENOX) injection  40 mg Subcutaneous Q24H  . pantoprazole  40 mg Oral Daily  . sertraline  100 mg Oral Daily  . traZODone  75 mg Oral QHS    HOME MEDICATIONS PRIOR TO ADMISSION Medications Prior to Admission  Medication Sig Dispense Refill  . aspirin-acetaminophen-caffeine (EXCEDRIN MIGRAINE) 250-250-65 MG tablet Take 2 tablets by mouth every 6 (six) hours as needed for headache.    . busPIRone (BUSPAR) 10 MG tablet Take 10 mg by mouth 2 (two) times daily.    .  sertraline (ZOLOFT) 100 MG tablet Take 100 mg by mouth daily.    . traZODone (DESYREL) 150 MG tablet Take 75 mg by mouth at bedtime.        LABORATORY STUDIES CBC    Component Value Date/Time   WBC 7.9 10/31/2020 0502   RBC 3.72 (L) 10/31/2020 0502   HGB 12.4 10/31/2020 0502   HCT 34.4 (L) 10/31/2020 0502   PLT 278 10/31/2020 0502   MCV 92.5 10/31/2020 0502   MCH 33.3 10/31/2020 0502   MCHC 36.0 10/31/2020 0502   RDW 12.0 10/31/2020 0502   LYMPHSABS 1.5 10/31/2020 0502   MONOABS 0.9 10/31/2020 0502   EOSABS 0.4 10/31/2020 0502   BASOSABS 0.0 10/31/2020 0502   CMP    Component Value Date/Time   NA 134 (L) 10/31/2020 0157   K 4.0 10/31/2020 0157   CL 96 (L) 10/31/2020 0157   CO2 26 10/31/2020 0157   GLUCOSE 99 10/31/2020 0157   BUN 13 10/31/2020 0157   CREATININE 0.97 10/31/2020 0157   CALCIUM 9.9 10/31/2020 0157   PROT 6.8 10/25/2020 0941   ALBUMIN 3.6 10/25/2020 0941   AST 24 10/25/2020 0941   ALT 13 10/25/2020 0941   ALKPHOS 65 10/25/2020 0941   BILITOT 0.5 10/25/2020 0941   GFRNONAA >60 10/31/2020 0157   COAGS Lab Results  Component Value Date   INR 1.1  10/27/2020   INR 1.0 10/25/2020   Lipid Panel    Component Value Date/Time   CHOL 288 (H) 10/25/2020 0941   TRIG 70 10/25/2020 0941   HDL 61 10/25/2020 0941   CHOLHDL 4.7 10/25/2020 0941   VLDL 14 10/25/2020 0941   LDLCALC 213 (H) 10/25/2020 0941   HgbA1C  Lab Results  Component Value Date   HGBA1C 4.9 10/25/2020   Urinalysis    Component Value Date/Time   COLORURINE YELLOW 10/25/2020 2033   APPEARANCEUR CLEAR 10/25/2020 2033   LABSPEC 1.016 10/25/2020 2033   PHURINE 6.0 10/25/2020 2033   GLUCOSEU NEGATIVE 10/25/2020 2033   HGBUR NEGATIVE 10/25/2020 2033   BILIRUBINUR NEGATIVE 10/25/2020 2033   KETONESUR NEGATIVE 10/25/2020 2033   PROTEINUR NEGATIVE 10/25/2020 2033   NITRITE NEGATIVE 10/25/2020 2033   LEUKOCYTESUR SMALL (A) 10/25/2020 2033   Urine Drug Screen     Component Value  Date/Time   LABOPIA NONE DETECTED 10/25/2020 2033   COCAINSCRNUR NONE DETECTED 10/25/2020 2033   LABBENZ NONE DETECTED 10/25/2020 2033   AMPHETMU NONE DETECTED 10/25/2020 2033   THCU NONE DETECTED 10/25/2020 2033   LABBARB NONE DETECTED 10/25/2020 2033    Alcohol Level    Component Value Date/Time   ETH <10 10/25/2020 0941     SIGNIFICANT DIAGNOSTIC STUDIES CT ANGIO HEAD W OR WO CONTRAST  Result Date: 10/26/2020 CLINICAL DATA:  Stroke EXAM: CT ANGIOGRAPHY HEAD AND NECK TECHNIQUE: Multidetector CT imaging of the head and neck was performed using the standard protocol during bolus administration of intravenous contrast. Multiplanar CT image reconstructions and MIPs were obtained to evaluate the vascular anatomy. Carotid stenosis measurements (when applicable) are obtained utilizing NASCET criteria, using the distal internal carotid diameter as the denominator. CONTRAST:  75mL OMNIPAQUE IOHEXOL 350 MG/ML SOLN COMPARISON:  MRI head 10/26/2020 and CT head 10/25/2020 FINDINGS: CT HEAD FINDINGS Brain: Acute infarct posterior limb internal capsule on the left best seen on diffusion-weighted imaging. Small hypodensity in this area. Otherwise no acute infarct, hemorrhage, mass. Ventricle size normal. Vascular: Negative for hyperdense vessel Skull: Negative Sinuses: Complete opacification right maxillary sinus which is contracted. Remaining sinuses clear. Mastoid clear. Orbits: Negative Review of the MIP images confirms the above findings CTA NECK FINDINGS Aortic arch: Atherosclerotic calcification aortic arch and proximal great vessels. Diffuse atherosclerotic disease right subclavian artery with mild stenosis. Moderate stenosis proximal left subclavian artery and left axillary artery. 60% diameter stenosis proximal left common carotid artery. Right carotid system: Right carotid stenting across the bifurcation. Stent is widely patent. Diffuse atherosclerotic calcification throughout the right common carotid  artery. Left carotid system: Atherosclerotic disease throughout the left common carotid artery with 60% diameter stenosis of the proximal left common carotid artery. Atherosclerotic disease left carotid bifurcation without significant internal carotid artery stenosis. Severe stenosis versus occlusion of the proximal left external carotid artery. Vertebral arteries: Right vertebral dominant. 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 Occlusion of the proximal left vertebral artery with reconstitution at the C6-7 level. This vessel is diffusely diseased but patent to the basilar. Skeleton: No acute skeletal abnormality.  Poor dentition. Other neck: Negative for mass or adenopathy in the neck. Upper chest: Mild apical emphysema without acute abnormality. Apical scarring bilaterally. Review of the MIP images confirms the above findings CTA HEAD FINDINGS Anterior circulation: Mild atherosclerotic calcification in the cavernous carotid bilaterally without stenosis. Anterior and middle cerebral arteries patent without stenosis or large vessel occlusion.  Posterior circulation: Both vertebral arteries patent to the basilar. PICA patent bilaterally. Basilar widely patent. AICA, superior cerebellar, posterior cerebral arteries patent without stenosis or large vessel occlusion. Venous sinuses: Normal venous enhancement. Anatomic variants: None Review of the MIP images confirms the above findings IMPRESSION: 1. Negative for intracranial large vessel occlusion or flow limiting stenosis. 2. 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 3. Severe atherosclerotic disease in the neck with extensive arterial calcification diffusely. 4. Right carotid stenting patent. 5. 60% diameter stenosis proximal left common carotid artery. Left internal carotid artery patent without significant stenosis. Occlusion or high-grade stenosis proximal  left external carotid artery. 6. 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 Electronically Signed   By: Marlan Palauharles  Clark M.D.   On: 10/26/2020 12:42   CT HEAD WO CONTRAST  Result Date: 10/25/2020 CLINICAL DATA:  Altered mental status. EXAM: CT HEAD WITHOUT CONTRAST TECHNIQUE: Contiguous axial images were obtained from the base of the skull through the vertex without intravenous contrast. COMPARISON:  Same day. FINDINGS: Brain: No evidence of acute infarction, hemorrhage, hydrocephalus, extra-axial collection or mass lesion/mass effect. Vascular: No hyperdense vessel or unexpected calcification. Skull: Normal. Negative for fracture or focal lesion. Sinuses/Orbits: Right maxillary sinusitis is noted. Other: None. IMPRESSION: Right maxillary sinusitis. No acute intracranial abnormality seen. Electronically Signed   By: Lupita RaiderJames  Green Jr M.D.   On: 10/25/2020 13:01   CT ANGIO NECK W OR WO CONTRAST  Result Date: 10/26/2020 CLINICAL DATA:  Stroke EXAM: CT ANGIOGRAPHY HEAD AND NECK TECHNIQUE: Multidetector CT imaging of the head and neck was performed using the standard protocol during bolus administration of intravenous contrast. Multiplanar CT image reconstructions and MIPs were obtained to evaluate the vascular anatomy. Carotid stenosis measurements (when applicable) are obtained utilizing NASCET criteria, using the distal internal carotid diameter as the denominator. CONTRAST:  75mL OMNIPAQUE IOHEXOL 350 MG/ML SOLN COMPARISON:  MRI head 10/26/2020 and CT head 10/25/2020 FINDINGS: CT HEAD FINDINGS Brain: Acute infarct posterior limb internal capsule on the left best seen on diffusion-weighted imaging. Small hypodensity in this area. Otherwise no acute infarct, hemorrhage, mass. Ventricle size normal. Vascular: Negative for hyperdense vessel Skull: Negative Sinuses: Complete opacification right maxillary sinus which is contracted. Remaining sinuses clear. Mastoid  clear. Orbits: Negative Review of the MIP images confirms the above findings CTA NECK FINDINGS Aortic arch: Atherosclerotic calcification aortic arch and proximal great vessels. Diffuse atherosclerotic disease right subclavian artery with mild stenosis. Moderate stenosis proximal left subclavian artery and left axillary artery. 60% diameter stenosis proximal left common carotid artery. Right carotid system: Right carotid stenting across the bifurcation. Stent is widely patent. Diffuse atherosclerotic calcification throughout the right common carotid artery. Left carotid system: Atherosclerotic disease throughout the left common carotid artery with 60% diameter stenosis of the proximal left common carotid artery. Atherosclerotic disease left carotid bifurcation without significant internal carotid artery stenosis. Severe stenosis versus occlusion of the proximal left external carotid artery. Vertebral arteries: Right vertebral dominant. 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 Occlusion of the proximal left vertebral artery with reconstitution at the C6-7 level. This vessel is diffusely diseased but patent to the basilar. Skeleton: No acute skeletal abnormality.  Poor dentition. Other neck: Negative for mass or adenopathy in the neck. Upper chest: Mild apical emphysema without acute abnormality. Apical scarring bilaterally. Review of the MIP images confirms the above findings CTA HEAD FINDINGS Anterior  circulation: Mild atherosclerotic calcification in the cavernous carotid bilaterally without stenosis. Anterior and middle cerebral arteries patent without stenosis or large vessel occlusion. Posterior circulation: Both vertebral arteries patent to the basilar. PICA patent bilaterally. Basilar widely patent. AICA, superior cerebellar, posterior cerebral arteries patent without stenosis or large vessel occlusion. Venous sinuses: Normal  venous enhancement. Anatomic variants: None Review of the MIP images confirms the above findings IMPRESSION: 1. Negative for intracranial large vessel occlusion or flow limiting stenosis. 2. 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 3. Severe atherosclerotic disease in the neck with extensive arterial calcification diffusely. 4. Right carotid stenting patent. 5. 60% diameter stenosis proximal left common carotid artery. Left internal carotid artery patent without significant stenosis. Occlusion or high-grade stenosis proximal left external carotid artery. 6. 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 Electronically Signed   By: Marlan Palau M.D.   On: 10/26/2020 12:42   MR ANGIO HEAD WO CONTRAST  Result Date: 10/25/2020 CLINICAL DATA:  Acute stroke.  Right-sided weakness. EXAM: MRI HEAD WITHOUT CONTRAST MRA HEAD WITHOUT CONTRAST TECHNIQUE: Multiplanar, multiecho pulse sequences of the brain and surrounding structures were obtained without intravenous contrast. Angiographic images of the head were obtained using MRA technique without contrast. COMPARISON:  CT head 10/25/2020 FINDINGS: MRI HEAD FINDINGS Brain: Acute infarct in the posterior limb internal capsule on the left with restricted diffusion. This area shows minimal FLAIR signal intensity. No other acute infarct identified Ventricle size normal. No mass lesion identified. Scattered small white matter hyperintensities, mild in degree. The patient was not able to complete the study. Gradient echo imaging not performed. All sequences not performed. Vascular: Normal arterial flow no focal voids skeletal lesion Skull and upper cervical spine: Negative Sinuses/Orbits: Mucosal edema paranasal sinuses most prominent in the right maxillary sinus. Negative orbit Other: None MRA HEAD FINDINGS Image quality degraded by motion. Right vertebral artery dominant and patent to the basilar. Moderate  stenosis distal left vertebral artery. Basilar widely patent. Superior cerebellar and posterior cerebral arteries patent bilaterally. Mild stenosis left P3 segment. Right posterior cerebral artery widely patent. Atherosclerotic irregularity in the cavernous carotid bilaterally causing mild to moderate stenosis. Anterior and middle cerebral arteries patent bilaterally without large vessel occlusion. Moderate stenosis in the right A2 segment. Mild stenosis left A2 segment. Middle cerebral arteries patent bilaterally without significant stenosis. Negative for cerebral aneurysm. IMPRESSION: 1. Acute infarct posterior limb internal capsule on the left with minimal FLAIR signal intensity. These results were called by telephone at the time of interpretation on 10/25/2020 at 10:40 Am to provider [facility] , who verbally acknowledged these results. 2. Motion degraded MRA.  No large vessel occlusion. Electronically Signed   By: Marlan Palau M.D.   On: 10/25/2020 11:27   MR BRAIN WO CONTRAST  Result Date: 10/26/2020 CLINICAL DATA:  Stroke, follow-up; status post tPA for ischemic stroke. Right facial droop and right-sided weakness. EXAM: MRI HEAD WITHOUT CONTRAST TECHNIQUE: Multiplanar, multiecho pulse sequences of the brain and surrounding structures were obtained without intravenous contrast. COMPARISON:  MRI/MRA head 10/25/2020, head CT 10/25/2020. FINDINGS: Brain: Mild intermittent motion degradation. Mild cerebral atrophy. An acute infarct within the left thalamocapsular junction has slightly increased in size as compared to the brain MRI performed earlier today, now measuring 17 mm (previously 15 mm). Corresponding T2/FLAIR hyperintensity at this site. Background mild multifocal T2/FLAIR hyperintensity within the cerebral white matter and pons is nonspecific, but compatible with chronic small vessel ischemic disease. No  evidence of intracranial mass. No chronic intracranial blood products. No extra-axial fluid  collection. No midline shift. Vascular: Expected proximal arterial flow voids. Skull and upper cervical spine: No focal marrow lesion. Sinuses/Orbits: Visualized orbits show no acute finding. Complete T2 hyperintense opacification of the right maxillary sinus. Mild ethmoid sinus mucosal thickening. IMPRESSION: 17 mm acute infarct within the left thalamocapsular junction, minimally increased in size as compared to the brain MRI performed earlier today. Background mild cerebral atrophy and chronic small vessel ischemic disease. Right maxillary sinusitis. Electronically Signed   By: Jackey Loge DO   On: 10/26/2020 11:21   MR BRAIN WO CONTRAST  Result Date: 10/25/2020 CLINICAL DATA:  Acute stroke.  Right-sided weakness. EXAM: MRI HEAD WITHOUT CONTRAST MRA HEAD WITHOUT CONTRAST TECHNIQUE: Multiplanar, multiecho pulse sequences of the brain and surrounding structures were obtained without intravenous contrast. Angiographic images of the head were obtained using MRA technique without contrast. COMPARISON:  CT head 10/25/2020 FINDINGS: MRI HEAD FINDINGS Brain: Acute infarct in the posterior limb internal capsule on the left with restricted diffusion. This area shows minimal FLAIR signal intensity. No other acute infarct identified Ventricle size normal. No mass lesion identified. Scattered small white matter hyperintensities, mild in degree. The patient was not able to complete the study. Gradient echo imaging not performed. All sequences not performed. Vascular: Normal arterial flow no focal voids skeletal lesion Skull and upper cervical spine: Negative Sinuses/Orbits: Mucosal edema paranasal sinuses most prominent in the right maxillary sinus. Negative orbit Other: None MRA HEAD FINDINGS Image quality degraded by motion. Right vertebral artery dominant and patent to the basilar. Moderate stenosis distal left vertebral artery. Basilar widely patent. Superior cerebellar and posterior cerebral arteries patent  bilaterally. Mild stenosis left P3 segment. Right posterior cerebral artery widely patent. Atherosclerotic irregularity in the cavernous carotid bilaterally causing mild to moderate stenosis. Anterior and middle cerebral arteries patent bilaterally without large vessel occlusion. Moderate stenosis in the right A2 segment. Mild stenosis left A2 segment. Middle cerebral arteries patent bilaterally without significant stenosis. Negative for cerebral aneurysm. IMPRESSION: 1. Acute infarct posterior limb internal capsule on the left with minimal FLAIR signal intensity. These results were called by telephone at the time of interpretation on 10/25/2020 at 10:40 Am to provider [facility] , who verbally acknowledged these results. 2. Motion degraded MRA.  No large vessel occlusion. Electronically Signed   By: Marlan Palau M.D.   On: 10/25/2020 11:27   ECHOCARDIOGRAM COMPLETE  Result Date: 10/25/2020    ECHOCARDIOGRAM REPORT   Patient Name:   Rebecca Sutton Date of Exam: 10/25/2020 Medical Rec #:  [id number]     Height:       63.0 in Accession #:    [id number]    Weight:       149.2 lb Date of Birth:  21-Sep-1961     BSA:          1.707 m Patient Age:    59 years      BP:           100/62 mmHg Patient Gender: F             HR:           73 bpm. Exam Location:  Inpatient Procedure: 2D Echo, Color Doppler and Cardiac Doppler Indications:    Stroke i163.9  History:        Patient has prior history of Echocardiogram examinations, most  recent 09/24/2018. Prior echo performed at [facility].  Sonographer:    Irving Burton Senior RDCS Referring Phys: [id number] Kara Mead IMPRESSIONS  1. Left ventricular ejection fraction, by estimation, is 60 to 65%. The left ventricle has normal function. The left ventricle has no regional wall motion abnormalities. Left ventricular diastolic parameters are indeterminate. Elevated left ventricular end-diastolic pressure.  2. Right ventricular systolic function is normal. The right  ventricular size is normal. Tricuspid regurgitation signal is inadequate for assessing PA pressure.  3. The mitral valve is normal in structure. Trivial mitral valve regurgitation. No evidence of mitral stenosis.  4. The aortic valve is tricuspid. Aortic valve regurgitation is not visualized. Mild aortic valve sclerosis is present, with no evidence of aortic valve stenosis.  5. There is Moderate (Grade III) layered plaque involving the transverse aorta.  6. The inferior vena cava is normal in size with greater than 50% respiratory variability, suggesting right atrial pressure of 3 mmHg. Consider Chest CT to evaluate aorta for aortic atherosclerosis. FINDINGS  Left Ventricle: Left ventricular ejection fraction, by estimation, is 60 to 65%. The left ventricle has normal function. The left ventricle has no regional wall motion abnormalities. The left ventricular internal cavity size was normal in size. There is  no left ventricular hypertrophy. Left ventricular diastolic parameters are indeterminate. Elevated left ventricular end-diastolic pressure. Right Ventricle: The right ventricular size is normal. No increase in right ventricular wall thickness. Right ventricular systolic function is normal. Tricuspid regurgitation signal is inadequate for assessing PA pressure. Left Atrium: Left atrial size was normal in size. Right Atrium: Right atrial size was normal in size. Pericardium: There is no evidence of pericardial effusion. Mitral Valve: The mitral valve is normal in structure. There is mild thickening of the mitral valve leaflet(s). There is mild calcification of the mitral valve leaflet(s). Trivial mitral valve regurgitation. No evidence of mitral valve stenosis. MV peak gradient, 10.1 mmHg. The mean mitral valve gradient is 6.0 mmHg. Tricuspid Valve: The tricuspid valve is normal in structure. Tricuspid valve regurgitation is not demonstrated. No evidence of tricuspid stenosis. Aortic Valve: The aortic valve is  tricuspid. Aortic valve regurgitation is not visualized. Mild aortic valve sclerosis is present, with no evidence of aortic valve stenosis. Pulmonic Valve: The pulmonic valve was normal in structure. Pulmonic valve regurgitation is not visualized. No evidence of pulmonic stenosis. Aorta: The aortic root is normal in size and structure. There is moderate (Grade III) layered plaque involving the transverse aorta. Venous: The inferior vena cava is normal in size with greater than 50% respiratory variability, suggesting right atrial pressure of 3 mmHg. IAS/Shunts: No atrial level shunt detected by color flow Doppler.  LEFT VENTRICLE PLAX 2D LVIDd:         4.00 cm  Diastology LVIDs:         2.60 cm  LV e' medial:    3.92 cm/s LV PW:         1.20 cm  LV E/e' medial:  36.0 LV IVS:        1.00 cm  LV e' lateral:   6.42 cm/s LVOT diam:     1.60 cm  LV E/e' lateral: 22.0 LV SV:         48 LV SV Index:   28 LVOT Area:     2.01 cm  RIGHT VENTRICLE RV S prime:     7.83 cm/s TAPSE (M-mode): 1.9 cm LEFT ATRIUM             Index  RIGHT ATRIUM          Index LA diam:        3.50 cm 2.05 cm/m  RA Area:     9.63 cm LA Vol (A2C):   60.1 ml 35.20 ml/m RA Volume:   18.00 ml 10.54 ml/m LA Vol (A4C):   42.6 ml 24.95 ml/m LA Biplane Vol: 51.6 ml 30.22 ml/m  AORTIC VALVE LVOT Vmax:   95.00 cm/s LVOT Vmean:  68.300 cm/s LVOT VTI:    0.240 m  AORTA Ao Root diam: 2.60 cm Ao Asc diam:  3.00 cm MITRAL VALVE MV Area (PHT): 2.91 cm     SHUNTS MV Peak grad:  10.1 mmHg    Systemic VTI:  0.24 m MV Mean grad:  6.0 mmHg     Systemic Diam: 1.60 cm MV Vmax:       1.59 m/s MV Vmean:      121.0 cm/s MV Decel Time: 261 msec MV E velocity: 141.00 cm/s MV A velocity: 129.00 cm/s MV E/A ratio:  1.09 Armanda Magic MD Electronically signed by Armanda Magic MD Signature Date/Time: 10/25/2020/4:09:10 PM    Final    CT HEAD CODE STROKE WO CONTRAST  Result Date: 10/25/2020 CLINICAL DATA:  Code stroke. EXAM: CT HEAD WITHOUT CONTRAST TECHNIQUE:  Contiguous axial images were obtained from the base of the skull through the vertex without intravenous contrast. COMPARISON:  None. FINDINGS: Brain: There is no acute intracranial hemorrhage, mass effect, or edema. Gray-white differentiation is preserved. Ventricles and sulci are normal in size and configuration. There is no extra-axial collection. Vascular: No hyperdense vessel. There is intracranial atherosclerotic calcification at the skull base. Skull: Unremarkable Sinuses/Orbits: Minimally imaged right maxillary sinus is opacified. Otherwise minor paranasal sinus mucosal thickening. Orbits are unremarkable. Other: Mastoid air cells are clear. ASPECTS ([place] [facility] CT Score) - Ganglionic level infarction (caudate, lentiform nuclei, internal capsule, insula, M1-M3 cortex): 7 - Supraganglionic infarction (M4-M6 cortex): 3 Total score (0-10 with 10 being normal): 10 IMPRESSION: There is no acute intracranial hemorrhage or evidence of acute infarction. ASPECT score is 10. These results were communicated to Dr. Iver Nestle at 10:06 am on 10/25/2020 by text page via the [facility] messaging system. Electronically Signed   By: Guadlupe Spanish M.D.   On: 10/25/2020 10:09   VAS US CAROTID  Result Date: 10/30/2020 Carotid Arterial Duplex Study Indications:       CVA. Risk Factors:      Current smoker, prior CVA. Other Factors:     ETOH abuse. Comparison Study:  No previous Exam Performing Technologist: Clint Guy RVT  Examination Guidelines: A complete evaluation includes B-mode imaging, spectral Doppler, color Doppler, and power Doppler as needed of all accessible portions of each vessel. Bilateral testing is considered an integral part of a complete examination. Limited examinations for reoccurring indications may be performed as noted.  Right Carotid Findings: +----------+--------+--------+--------+------------------+--------+           PSV cm/sEDV cm/sStenosisPlaque DescriptionComments  +----------+--------+--------+--------+------------------+--------+ CCA Prox  326     28      >50%    heterogenous               +----------+--------+--------+--------+------------------+--------+ CCA Distal137     29                                         +----------+--------+--------+--------+------------------+--------+ ICA Prox  159  24      1-39%   heterogenous               +----------+--------+--------+--------+------------------+--------+ ICA Mid   149     36                                         +----------+--------+--------+--------+------------------+--------+ ICA Distal147     31                                         +----------+--------+--------+--------+------------------+--------+ ECA       288     47      >50%                               +----------+--------+--------+--------+------------------+--------+ +----------+--------+-------+--------+-------------------+           PSV cm/sEDV cmsDescribeArm Pressure (mmHG) +----------+--------+-------+--------+-------------------+ [id number]     79     Stenotic                    +----------+--------+-------+--------+-------------------+ +---------+--------+--+--------+--+---------+ VertebralPSV cm/s55EDV cm/s17Antegrade +---------+--------+--+--------+--+---------+  Left Carotid Findings: +----------+--------+--------+--------+------------------+--------+           PSV cm/sEDV cm/sStenosisPlaque DescriptionComments +----------+--------+--------+--------+------------------+--------+ CCA Prox  144     21                                         +----------+--------+--------+--------+------------------+--------+ CCA Distal180     34                                         +----------+--------+--------+--------+------------------+--------+ ICA Prox  111     24      1-39%   heterogenous                +----------+--------+--------+--------+------------------+--------+ ICA Mid   141     26                                         +----------+--------+--------+--------+------------------+--------+ ICA Distal132     23                                         +----------+--------+--------+--------+------------------+--------+ ECA       55      17                                         +----------+--------+--------+--------+------------------+--------+ +----------+--------+--------+--------+-------------------+           PSV cm/sEDV cm/sDescribeArm Pressure (mmHG) +----------+--------+--------+--------+-------------------+ [id number]             Stenotic                    +----------+--------+--------+--------+-------------------+ +---------+--------+--+--------+---------------------+ VertebralPSV cm/s25EDV cm/sAntegrade and blunted +---------+--------+--+--------+---------------------+   Summary: Right Carotid: Velocities in the right  ICA are consistent with a 1-39% stenosis.                Hemodynamically significant plaque >50% visualized in the CCA.                The ECA appears >50% stenosed. Left Carotid: Velocities in the left ICA are consistent with a 1-39% stenosis.               The ECA appears >50% stenosed. ECA blunted. Vertebrals:  Bilateral vertebral arteries demonstrate antegrade flow. Left is              blunted. Subclavians: Bilateral subclavian arteries were stenotic. *See table(s) above for measurements and observations.  Electronically signed by Waverly Ferrari MD on 10/30/2020 at 4:34:16 PM.    Final    IR ANGIO INTRA EXTRACRAN SEL COM CAROTID INNOMINATE BILAT MOD SED  Result Date: 10/30/2020 CLINICAL DATA:  Left-sided cerebral hemispheric stroke. Right-sided weakness. High-grade stenosis of the left common carotid artery on CT angiogram of the head and neck. EXAM: BILATERAL COMMON CAROTID AND INNOMINATE ANGIOGRAPHY COMPARISON:  CT angiogram of  the head and neck of October 26, 2020. MEDICATIONS: Heparin 1000 units IV; no antibiotic was administered within 1 hour of the procedure. ANESTHESIA/SEDATION: Versed 0 mg IV; Fentanyl 37.5 mcg IV.  Benadryl 25 mg IV. Moderate Sedation Time:  39 minutes The patient was continuously monitored during the procedure by the interventional radiology nurse under my direct supervision. CONTRAST:  Isovue 300 approximately 65 mL FLUOROSCOPY TIME:  Fluoroscopy Time: 1 minute 24 seconds (681 mGy). COMPLICATIONS: None immediate. TECHNIQUE: Informed written consent was obtained from the patient after a thorough discussion of the procedural risks, benefits and alternatives. All questions were addressed. Maximal Sterile Barrier Technique was utilized including caps, mask, sterile gowns, sterile gloves, sterile drape, hand hygiene and skin antiseptic. A timeout was performed prior to the initiation of the procedure. The right groin was prepped and draped in the usual sterile fashion. Thereafter using modified Seldinger technique, transfemoral access into the right common femoral artery was obtained without difficulty. Over a 0.035 inch guidewire, a 5 French Pinnacle sheath was inserted. Through this, and also over 0.035 inch guidewire, a 5 [place] JB 1 catheter was advanced to the aortic arch region and selectively positioned in the right common carotid artery, right subclavian artery, the left common carotid artery and the left subclavian artery. FINDINGS: The right subclavian arteriogram demonstrates a 50% stenosis in the proximal right subclavian artery, and a 50% stenosis just proximal to the origin of the thyrocervical trunk. 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 Hypertrophic ascending cervical branch of the thyrocervical trunk is seen cranially with reconstitution of the right vertebral artery at the level of C1 and subsequent retrograde opacification of the right vertebral artery to the level of  C6. More distally the vertebral artery is reconstituted and of normal caliber. Patency is seen of the right posterior-inferior cerebellar artery and the right vertebrobasilar junction. The basilar artery, the posterior cerebral arteries, the superior cerebellar arteries and the anterior-inferior cerebellar arteries opacify into the capillary and venous phases. Also seen is retrograde opacification of the right external carotid artery via the ipsilateral occipital artery to opacify the right facial artery, the right lingual artery, and right internal maxillary artery and the ascending pharyngeal artery. Right common carotid arteriogram demonstrates non opacification of the right external carotid artery. The previously positioned stent in the proximal right internal carotid artery is patent with  a stenosis of 30% on the AP projection. More distally the right internal carotid artery is seen to opacify to the cranial skull base. The petrous, the cavernous and the supraclinoid segments are widely patent , The right middle cerebral artery and the right anterior cerebral artery opacify into the capillary and venous phases. Cross-filling via the anterior communicating artery of the left anterior cerebral artery A2 segment is seen transiently. Also seen is opacification via the right posterior communicating artery of the right posterior cerebral artery. The left common carotid artery proximally has a 50-60% stenosis. More distally there is approximately 30% stenosis in the mid cervical left common carotid artery. The common carotid bifurcation demonstrates non opacification of the left external carotid artery. Left internal carotid artery has mild atherosclerotic disease without evident significant stenosis by the NASCET criteria. More distally the left internal carotid artery is seen to opacify to the cranial skull base. The petrous, the cavernous and the supraclinoid segments are widely patent. The left posterior  communicating artery is seen opacifying transiently left posterior-inferior cerebellar artery. The left middle cerebral artery and the left anterior cerebral artery opacify into the capillary and venous phases. Transient cross-filling via the anterior communicating artery of the right anterior cerebral A2 segment is seen. The left subclavian arteriogram demonstrates a tapered approximately 75-80% stenosis of the subclavian artery proximal to an occluded left vertebral artery. Also demonstrated is a diminutive ascending cervical branch of the thyrocervical trunk. Reconstitution of the ascending cervical branch is seen from transverse collaterals arising from the costo cervical trunk with the delayed sequences demonstrating a delayed distal reconstitution of the left vertebral artery at the level of C1 from the reconstituted ascending cervical branch of the left thyrocervical trunk. Retrograde opacification of the left external carotid artery and branches is seen from the ipsilateral occipital artery. The left posterior-inferior cerebellar artery and the immediate vertebrobasilar junction is widely patent though hypoplastic on a developmental basis. There is flow seen into the proximal basilar artery. 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 Bilateral reconstitution of the vertebral arteries from the ascending cervical branch of the thyrocervical trunk on the right, and from the costo cervical trunk of the ascending cervical branch of the thyrocervical trunk with subsequent opacification of the left vertebrobasilar junction and the left posterior-inferior cerebellar artery. Retrograde opacification of the occluded right vertebral artery to the level of C6, from the prominent ascending cervical branch of the right thyrocervical trunk. Bilaterally occluded external carotid arteries at the origins. Approximately 30% intra stent stenosis of the stent in the  proximal right internal carotid artery. 50% tandem stenosis of the subclavian artery, approximately 75% stenosis of the left subclavian artery. Approximately 50-60% stenosis of the left common carotid artery proximally extending to its origin. PLAN: Findings discussed with the patient. Question endovascular revascularization of the left common carotid artery at its origin and proximally. Electronically Signed   By: Julieanne Cotton M.D.   On: 10/27/2020 14:08   IR ANGIO VERTEBRAL SEL SUBCLAVIAN INNOMINATE BILAT MOD SED  Result Date: 10/30/2020 CLINICAL DATA:  Left-sided cerebral hemispheric stroke. Right-sided weakness. High-grade stenosis of the left common carotid artery on CT angiogram of the head and neck. EXAM: BILATERAL COMMON CAROTID AND INNOMINATE ANGIOGRAPHY COMPARISON:  CT angiogram of the head and neck of October 26, 2020. MEDICATIONS: Heparin 1000 units IV; no antibiotic was administered within 1 hour of the procedure. ANESTHESIA/SEDATION: Versed 0 mg IV; Fentanyl 37.5 mcg IV.  Benadryl 25 mg IV.  Moderate Sedation Time:  39 minutes The patient was continuously monitored during the procedure by the interventional radiology nurse under my direct supervision. CONTRAST:  Isovue 300 approximately 65 mL FLUOROSCOPY TIME:  Fluoroscopy Time: 1 minute 24 seconds (681 mGy). COMPLICATIONS: None immediate. TECHNIQUE: Informed written consent was obtained from the patient after a thorough discussion of the procedural risks, benefits and alternatives. All questions were addressed. Maximal Sterile Barrier Technique was utilized including caps, mask, sterile gowns, sterile gloves, sterile drape, hand hygiene and skin antiseptic. A timeout was performed prior to the initiation of the procedure. The right groin was prepped and draped in the usual sterile fashion. Thereafter using modified Seldinger technique, transfemoral access into the right common femoral artery was obtained without difficulty. Over a 0.035  inch guidewire, a 5 French Pinnacle sheath was inserted. Through this, and also over 0.035 inch guidewire, a 5 [place] JB 1 catheter was advanced to the aortic arch region and selectively positioned in the right common carotid artery, right subclavian artery, the left common carotid artery and the left subclavian artery. FINDINGS: The right subclavian arteriogram demonstrates a 50% stenosis in the proximal right subclavian artery, and a 50% stenosis just proximal to the origin of the thyrocervical trunk. 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 Hypertrophic ascending cervical branch of the thyrocervical trunk is seen cranially with reconstitution of the right vertebral artery at the level of C1 and subsequent retrograde opacification of the right vertebral artery to the level of C6. More distally the vertebral artery is reconstituted and of normal caliber. Patency is seen of the right posterior-inferior cerebellar artery and the right vertebrobasilar junction. The basilar artery, the posterior cerebral arteries, the superior cerebellar arteries and the anterior-inferior cerebellar arteries opacify into the capillary and venous phases. Also seen is retrograde opacification of the right external carotid artery via the ipsilateral occipital artery to opacify the right facial artery, the right lingual artery, and right internal maxillary artery and the ascending pharyngeal artery. Right common carotid arteriogram demonstrates non opacification of the right external carotid artery. The previously positioned stent in the proximal right internal carotid artery is patent with a stenosis of 30% on the AP projection. More distally the right internal carotid artery is seen to opacify to the cranial skull base. The petrous, the cavernous and the supraclinoid segments are widely patent , The right middle cerebral artery and the right anterior cerebral artery opacify into the capillary and venous phases.  Cross-filling via the anterior communicating artery of the left anterior cerebral artery A2 segment is seen transiently. Also seen is opacification via the right posterior communicating artery of the right posterior cerebral artery. The left common carotid artery proximally has a 50-60% stenosis. More distally there is approximately 30% stenosis in the mid cervical left common carotid artery. The common carotid bifurcation demonstrates non opacification of the left external carotid artery. Left internal carotid artery has mild atherosclerotic disease without evident significant stenosis by the NASCET criteria. More distally the left internal carotid artery is seen to opacify to the cranial skull base. The petrous, the cavernous and the supraclinoid segments are widely patent. The left posterior communicating artery is seen opacifying transiently left posterior-inferior cerebellar artery. The left middle cerebral artery and the left anterior cerebral artery opacify into the capillary and venous phases. Transient cross-filling via the anterior communicating artery of the right anterior cerebral A2 segment is seen. The left subclavian arteriogram demonstrates a tapered approximately 75-80% stenosis of the subclavian artery  proximal to an occluded left vertebral artery. Also demonstrated is a diminutive ascending cervical branch of the thyrocervical trunk. Reconstitution of the ascending cervical branch is seen from transverse collaterals arising from the costo cervical trunk with the delayed sequences demonstrating a delayed distal reconstitution of the left vertebral artery at the level of C1 from the reconstituted ascending cervical branch of the left thyrocervical trunk. Retrograde opacification of the left external carotid artery and branches is seen from the ipsilateral occipital artery. The left posterior-inferior cerebellar artery and the immediate vertebrobasilar junction is widely patent though hypoplastic on  a developmental basis. There is flow seen into the proximal basilar artery. 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 Bilateral reconstitution of the vertebral arteries from the ascending cervical branch of the thyrocervical trunk on the right, and from the costo cervical trunk of the ascending cervical branch of the thyrocervical trunk with subsequent opacification of the left vertebrobasilar junction and the left posterior-inferior cerebellar artery. Retrograde opacification of the occluded right vertebral artery to the level of C6, from the prominent ascending cervical branch of the right thyrocervical trunk. Bilaterally occluded external carotid arteries at the origins. Approximately 30% intra stent stenosis of the stent in the proximal right internal carotid artery. 50% tandem stenosis of the subclavian artery, approximately 75% stenosis of the left subclavian artery. Approximately 50-60% stenosis of the left common carotid artery proximally extending to its origin. PLAN: Findings discussed with the patient. Question endovascular revascularization of the left common carotid artery at its origin and proximally. Electronically Signed   By: Julieanne Cotton M.D.   On: 10/27/2020 14:08      HISTORY OF PRESENT ILLNESS (From H&P by Dr Iver Nestle on 10/25/20) Rebecca Sutton is a 59 y.o. female with a past medical history significant for bilateral carotid stenosis, prior right carotid stent due to stroke presenting with left-sided weakness, ongoing tobacco abuse, sensorineural hearing loss bilateral ears.  She was in her usual state of health when she went to bed last night at around midnight or 12:30 AM.  When she woke up she had right-sided weakness.  She attempted to ambulate to the bathroom but fell, and therefore called EMS for assistance.  Code stroke was activated by ED provider Dr. Vanetta Mulders after the patient arrived. After head CT  confirmed no clear acute process, MRI was obtained on my recommendations to treat per wake-up stroke criteria given the disabling nature of her deficits.  After confirmation of minimal FLAIR change on MRI with significant DWI change, tPA checklist was completed with patient and family, and tPA administration was begun.  She did experience 2 episodes of emesis after tPA was begun.  Her NIH score initially was improving, with improving movement of her right upper extremity and improving sensation, but then worsened again for which repeat head CT was obtained and showed no intracranial hemorrhage. On review of systems, she noted no significant headaches other than her baseline headaches, no bleeding including no bloody emesis, coffee-ground emesis, blood in her stool, blood in her urine, no recent procedures, she denied fevers, chills, sweats, or any other signs and symptoms of recent infection Regarding her prior stroke history, she [facility]/24[facility]2018 with dysarthria and left facial droop, left-sided weakness and sensory deficit.  She was treated with tPA, and underwent right carotid artery stent placement LKW: Midnight tPA given?: Yes per wakeup stroke protocol 10:45 AM  Delayed due to need for MRI due to wake-up stroke Premorbid modified rankin scale: 0-1     0 - No symptoms.     1 - No significant disability. Able to carry out all usual activities, despite some symptoms.  HOSPITAL COURSE Rebecca Sutton is a 59 y.o. female with history of bilateral carotid stenosis, prior right carotid stent due to stroke presenting with left-sided weakness, ongoing tobacco abuse, sensorineural hearing loss bilateral ears presenting with a fall from R sided weakness. Received IV tPA 10/25/2020 at 1044 per wake-up stroke criteria w/ DWI mismatch. Emesis post tPA w/ neuro worsening. CT neg for hemorrhage.     Stroke:   L PLIC infarct s/p tPA secondary to likely.  small vessel disease .CT and cerebral angiogram suggest moderate left carotid stenosis likely incidental   code Stroke CT head No acute abnormality. ASPECTS 10.     MRI  L PLIC minimal FLAIR intensity  MRA  Motion degraded  Repeat CT head w/ neuro worsening No acute abnormality. Sinus dz.  MRI  L thalamocapsular jxn infarct slightly larger than earlier MRI  CTA head & neck left CCA proximal high-grade stenosis 60%, right ICA stent patent  Cerebral angiogram bilateral VA occlusion, left CCA 50 to 60% stenosis, right SCA 50%, left SCA 75% stenosis.  2D Echo EF 60-65%. No source of embolus. Moderate grade III transverse aortic plaque.  LDL 213  HgbA1c 4.9  UDS neg  VTE prophylaxis - SCDs   Aspirin 325 mg daily prior to admission, now on aspirin 81 mg daily and clopidogrel 75 mg daily after Plavix load.  Therapy recommendations: CIR  Disposition:  Discharge to Rehab today  Hx stroke/TIA  09/2018 - dysarthria, L facial droop, L sided weakness and sensory deficit admitted in [facility]. Treated w/ tPA.  CT head and neck showed right ICA thrombus status post R ICA stent placement.  MRI showed right MCA infarcts.  EF 60 to 65%.  A1c 4.7 LDL 106.  Discharged with DAPT and Lipitor 80.   Carotid Stenosis, bilalteral  S/p R CAS 09/2018 at [facility]  CT head and neck this time showed right carotid stent patent, however, left proximal CCA high-grade stenosis 60%  Cerebral angiogram 10/30/20 showed left CCA 50 to 60% stenosis - Dr Corliss Skains.  Plan to have left CCA stenting next Tuesday.  However, if CIR placement before Tuesday, procedure can be postponed at the discharge of CIR.   Hyperlipidemia  Home meds:  None   Put on Lipitor 80   LDL 213, goal < 70  Continue statin at discharge  Tobacco abuse  Current smoker  Smoking cessation counseling provided  Pt is willing to quit  Other Stroke Risk Factors  Hx severe ETOH use, last nmtake 09/04/2020. alcohol level  <10, advised to drink no more than 1 drink(s) a day  Substance abuse - Hx crack cocaine use   Family hx stroke (distant "someone on her mother's side)  Other Active Problems, Findings and Recommendations  Anxiety on buspar, sertraline, trazodone  Nausea and emesis on zofran prn  Plan to have left CCA stenting next Tuesday.  However, if CIR placement before Tuesday, procedure can be postponed at the discharge of CIR.   Labs stable.  BP somewhat low at times considering  Left internal carotid artery stenosis however pt is not on any scheduled anti-htn medications. She is currently getting NS at 50 mls / hr. Consider increasing IV fluids if BP continues to run low.  DISCHARGE EXAM Vitals:   10/31/20 2009 11/01/20 0022 11/01/20 0416 11/01/20 0816  BP: 105/60  124/72 109/73  Pulse: 79   91  Resp: 16   13  Temp: 98.2 F (36.8 C) 97.8 F (36.6 C) 97.8 F (36.6 C) 97.9 F (36.6 C)  TempSrc: Oral Oral Oral Oral  SpO2: 98%   96%  Weight:      Height:       General - Well nourished, well developed, in no apparent distress.  Ophthalmologic - no ptosis, fundi not visualized PERRLA, EOEMI  Cardiovascular - Regular rhythm and rate.  Mental Status -  Awake, alert orientation to time, place, and person were intact. Language including comprehension and repetition intact.  Moderate dysarthria   Cranial Nerves II - XII - II - Visual field intact OU. III, IV, VI - Extraocular movements intact. V - Facial sensation intact bilaterally. VII - right facial droop VIII - Hearing & vestibular intact bilaterally. X - Palate elevates symmetrically.  Moderate dysarthria XI - Chin turning & shoulder shrug intact bilaterally. XII - Tongue protrusion intact.  Motor Strength - The patient's strength was normal in left upper and lower extremities, however, right extremity 3/5 right lower extremity proximal 3-/5, knee flexion 3/5, ankle DF/PF 0/5.Marland Kitchen  Bulk was normal and fasciculations  were absent.   Motor Tone - normal tone   Sensory - Light touch, temperature/pinprick were assessed and were decreased on the right lower extremity.    Coordination - FNF normal on left. FNF slow on right but able to perform.  Gait and Station - deferred.   Discharge Diet   Diet Order            Diet Heart Room service appropriate? Yes; Fluid consistency: Thin  Diet effective now                liquids  DISCHARGE PLAN  Disposition:  Discharge to [facility] for ongoing PT, OT and ST  aspirin 81 mg daily and clopidogrel 75 mg daily for secondary stroke prevention.  Recommend ongoing risk factor control by Primary Care Physician at time of discharge from inpatient rehabilitation.  Follow-up Pcp, No in 2 weeks following discharge from rehab.  Follow-up in [facility] in 4 weeks following discharge from rehab, office to schedule an appointment.   33 minutes were spent preparing discharge.  Delton See PA-C [facility] Pager [phone] 11/01/2020, 11:38 AM I have personally obtained history,examined this patient, reviewed notes, independently viewed imaging studies, participated in medical decision making and plan of care.ROS completed by me personally and pertinent positives fully documented  I have made any additions or clarifications directly to the above note. Agree with note above.    Delia Heady, MD Medical Director [facility] Pager: [phone] 11/01/2020 1:55 PM

## 2020-11-01 NOTE — Progress Notes (Signed)
PMR Admission Coordinator Pre-Admission Assessment   Patient: Rebecca GoldenHelen M Tammen is an 59 y.o., female MRN: [id number] DOB: 01/05/1961 Height: 5\' 3"  (160 cm) Weight: 67.7 kg                                                                                                                                                  Insurance Information HMO:     PPO:      PCP:      IPA:      80/20:      OTHER:  PRIMARY: Uninsured (Medicaid Potential)      Policy#:       Subscriber:  CM Name:       Phone#:      Fax#:  Pre-Cert#:       Employer:  Benefits:  Phone #:      Name:  Eff. Date:      Deduct:       Out of Pocket Max:       Life Max:   CIR:       SNF:  Outpatient:      Co-Pay:  Home Health:       Co-Pay:  DME:      Co-Pay:  Providers:  SECONDARY:       Policy#:       Phone#:    Artistinancial Counselor:       Phone#:    The Data processing manager"Data Collection Information Summary" for patients in Inpatient Rehabilitation Facilities with attached "Privacy Act Statement-Health Care Records" was provided and verbally reviewed with: N/A   Emergency Contact Information         Contact Information     Name Relation Home Work Mobile    Daleen Snookerez, Melissa Daughter     [phone]       Current Medical History  Patient Admitting Diagnosis: acute left internal capsule infarct   History of Present Illness: Pt is a 59 y.o. female with history of CAD, sensorineural hearing loss, binge drinking (none for the past month), CVA who was admitted on 10/25/2020 with right facial droop, right hemiparesis, and a fall.  History taken from chart review, patient, and mother.  MRI/MRI brain showed acute left internal capsule infarct with minimal flair intensity and no LVO. CTA head/neck showed acute infarct in left internal capsule, patent right ICA stent, 60% stenosis proximal L-CCA, occlusion or high-grade stenosis proximal L-PCA, occlusion of proximal B-VA with reconstitution at C6 level.  Echocardiogram with ejection fraction of 60-65%, no wall  abnormality, moderate layering plaque in transverse aorta and was negative for shunt. Patient loaded with Plavix and started on DAPT with recommendations for cerebral angio for work-up. She underwent cerebral angiogram on 10/27/2020 revealing bilateral vertebral artery occlusion PCAs, 50 to 60% stenosis proximal L-CCA, 50% stenosis R-SCA proximally and 75% stenosis L-SCA proximally.   Speech therapy evaluation  revealed mild to moderate flaccid dysarthria with oral motor weakness. Swallow function intact. OT evaluation reveals deficits due to right hemiparesis with impaired balance, decreased activity tolerance, and posterior lean affecting ADLs and mobility. CIR was recommended to functional decline.   Complete NIHSS TOTAL: 1 Glasgow Coma Scale Score: 15   Past Medical History      Past Medical History:  Diagnosis Date  . CAD (coronary artery disease)    . Carotid stenosis      bilateral  . CVA (cerebral vascular accident) (HCC) 2019  . H/O ETOH abuse 08/2020  . Smoking trying to quit      1/2 PPD  . Substance abuse (HCC)      last used 20 years ago      Family History  family history includes COPD in her father; Diabetes in her mother; Heart attack in her father; Heart disease in her paternal uncle; High blood pressure in her mother.   Prior Rehab/Hospitalizations:  Has the patient had prior rehab or hospitalizations prior to admission? No   Has the patient had major surgery during 100 days prior to admission? No   Current Medications    Current Facility-Administered Medications:  .  0.9 %  sodium chloride infusion, , Intravenous, Continuous, Toberman, Stevi W, NP, Last Rate: 50 mL/hr at 10/26/20 2257, New Bag at 10/26/20 2257 .  acetaminophen (TYLENOL) tablet 650 mg, 650 mg, Oral, Q4H PRN, 650 mg at 10/28/20 0757 **OR** acetaminophen (TYLENOL) 160 MG/5ML solution 650 mg, 650 mg, Per Tube, Q4H PRN **OR** acetaminophen (TYLENOL) suppository 650 mg, 650 mg, Rectal, Q4H PRN, Lanae Boast W, NP .  aspirin EC tablet 81 mg, 81 mg, Oral, Daily, Micki Riley, MD, 81 mg at 11/01/20 0856 .  atorvastatin (LIPITOR) tablet 80 mg, 80 mg, Oral, Daily, Pham, Minh Q, RPH-CPP, 80 mg at 11/01/20 0856 .  busPIRone (BUSPAR) tablet 10 mg, 10 mg, Oral, BID, Bhagat, Srishti L, MD, 10 mg at 11/01/20 0856 .  clopidogrel (PLAVIX) tablet 75 mg, 75 mg, Oral, Daily, Marvel Plan, MD, 75 mg at 11/01/20 0856 .  enoxaparin (LOVENOX) injection 40 mg, 40 mg, Subcutaneous, Q24H, Marvel Plan, MD, 40 mg at 10/31/20 2216 .  iohexol (OMNIPAQUE) 300 MG/ML solution 150 mL, 150 mL, Per Tube, Once PRN, Deveshwar, Sanjeev, MD .  labetalol (NORMODYNE) injection 5 mg, 5 mg, Intravenous, Q2H PRN, Marvel Plan, MD .  ondansetron (ZOFRAN) injection 4 mg, 4 mg, Intravenous, Q6H PRN, Bhagat, Srishti L, MD, 4 mg at 10/25/20 1203 .  pantoprazole (PROTONIX) EC tablet 40 mg, 40 mg, Oral, Daily, Marvel Plan, MD, 40 mg at 11/01/20 0856 .  senna-docusate (Senokot-S) tablet 1 tablet, 1 tablet, Oral, QHS PRN, Kara Mead, NP .  sertraline (ZOLOFT) tablet 100 mg, 100 mg, Oral, Daily, Marvel Plan, MD, 100 mg at 10/30/20 1715 .  traZODone (DESYREL) tablet 75 mg, 75 mg, Oral, QHS, Bhagat, Srishti L, MD, 75 mg at 10/31/20 2216   Patients Current Diet:     Diet Order                      Diet Heart Room service appropriate? Yes; Fluid consistency: Thin  Diet effective now                      Precautions / Restrictions Precautions Precautions: Fall Precaution Comments: R hemiparesis RUE>LE Restrictions Weight Bearing Restrictions: No    Has the patient had 2 or more falls  or a fall with injury in the past year?Yes   Prior Activity Level Limited Community (1-2x/wk): leaves house 2-3x/week, does not drive, not working, does not use DME at baseline   Prior Functional Level Prior Function Level of Independence: Independent Comments: does everything for self, does not drive but has mother/daughter to take her  places.  Has 4 cats   Self Care: Did the patient need help bathing, dressing, using the toilet or eating?  Independent   Indoor Mobility: Did the patient need assistance with walking from room to room (with or without device)? Independent   Stairs: Did the patient need assistance with internal or external stairs (with or without device)? Independent   Functional Cognition: Did the patient need help planning regular tasks such as shopping or remembering to take medications? Independent   Home Assistive Devices / Equipment Home Assistive Devices/Equipment: None Home Equipment: Walker - 2 wheels,Bedside commode,Wheelchair - manual   Prior Device Use: Indicate devices/aids used by the patient prior to current illness, exacerbation or injury? None of the above   Current Functional Level Cognition   Arousal/Alertness: Awake/alert Overall Cognitive Status: Within Functional Limits for tasks assessed Orientation Level: Oriented X4 General Comments: grossly intact Memory: Appears intact Awareness: Appears intact Problem Solving: Appears intact Safety/Judgment: Appears intact    Extremity Assessment (includes Sensation/Coordination)   Upper Extremity Assessment: RUE deficits/detail RUE Deficits / Details: Pt demonstrates movement of Rt UE and hand in Brunnstrom stage 2 -flexor synergies beginning.  PROM WFL RUE Coordination: decreased fine motor,decreased gross motor  Lower Extremity Assessment: Defer to PT evaluation RLE Deficits / Details: 2+/5 knee extension, 2/5 hip flexion (modified, in supine), 3/5 hip abd/add (modified, in supine), 0/5 PF/DF. flexor synergy noted in WB RLE Sensation: WNL     ADLs   Overall ADL's : Needs assistance/impaired Eating/Feeding: Set up,Sitting Grooming: Oral care,Standing,Minimal assistance,Moderate assistance,Cueing for sequencing,Cueing for compensatory techniques Grooming Details (indicate cue type and reason): MIN - MOD A for standing balance, cues  to incorporate RUE as gross assist Upper Body Bathing: Moderate assistance,Sitting Lower Body Bathing: Maximal assistance,Sit to/from stand Upper Body Dressing : Maximal assistance,Sitting Lower Body Dressing: Total assistance,Bed level Lower Body Dressing Details (indicate cue type and reason): to don socks from bed level Toilet Transfer: Minimal assistance,+2 for physical assistance,Ambulation Toilet Transfer Details (indicate cue type and reason): simulated via functional mobility Toileting- Clothing Manipulation and Hygiene: Total assistance,Sit to/from stand Functional mobility during ADLs: Minimal assistance,+2 for physical assistance General ADL Comments: pt continue to present with R sided weakness, imparied balance, and decreased activity tolerance     Mobility   Overal bed mobility: Needs Assistance Bed Mobility: Supine to Sit Supine to sit: HOB elevated,Supervision General bed mobility comments: increased time with supervision for lines     Transfers   Overall transfer level: Needs assistance Equipment used: 1 person hand held assist Transfers: Sit to/from Stand Sit to Stand: Min assist Stand pivot transfers: Mod assist,+2 physical assistance General transfer comment: Repeated x 5 for RLE strengthening (using RUE only to assist forcing use of RLE)     Ambulation / Gait / Stairs / Wheelchair Mobility   Ambulation/Gait Ambulation/Gait assistance: Min assist,Mod assist Gait Distance (Feet): 6 Feet (standing activities at sink, fatigued and mod assist to walk 4 ft to recliner) Assistive device: 1 person hand held assist (at end, Lt HHA and RUE on counter) Gait Pattern/deviations: Step-to pattern,Decreased stance time - right,Decreased weight shift to right,Decreased dorsiflexion - right General Gait Details: Ambulated to  sink with Lt HHA with no rt knee buckling, cues for knee extension. As pt fatigued, R knee with mild buckling as walking sink to recliner. Gait velocity:  decreased     Posture / Balance Dynamic Sitting Balance Sitting balance - Comments: able to reach to lt foot to pull up her sock Balance Overall balance assessment: Needs assistance Sitting-balance support: Feet supported,No upper extremity supported Sitting balance-Leahy Scale: Good Sitting balance - Comments: able to reach to lt foot to pull up her sock Postural control: Posterior lean,Right lateral lean Standing balance support: Single extremity supported Standing balance-Leahy Scale: Poor Standing balance comment: Pt requiring UE support.  DId perform brushing teeth at sink in standing - pt requiring mod A at times due to leaning R     Special needs/care consideration Continuous Drip IV  0.9% sodium chloride infusion: 83mL/hr, Skin ecchymosis: arm; bilateral;  Incision: groin, bladder incontinence; external urinary catheter and Designated visitor Daleen Snook, daughter; Gigi Gin, mother        Previous Home Environment (from acute therapy documentation) Living Arrangements: Non-relatives/Friends  Lives With: Other (Comment) (roomate. May not live with him anymore in near future) Available Help at Discharge: Family,Available PRN/intermittently Type of Home: House Home Layout: One level Home Access: Stairs to enter Entrance Stairs-Rails: Interior and spatial designer of Steps: 2 Bathroom Shower/Tub: Engineer, manufacturing systems: Standard Bathroom Accessibility: Yes How Accessible: Accessible via walker Home Care Services: No Additional Comments: has access to equipment, from late brother (passed in October)   Discharge Living Setting Plans for Discharge Living Setting: House Type of Home at Discharge: House Discharge Home Layout: Two level,Able to live on main level with bedroom/bathroom Discharge Home Access: Stairs to enter Entrance Stairs-Rails: Left Entrance Stairs-Number of Steps: 3 Discharge Bathroom Shower/Tub: Walk-in shower Discharge Bathroom Toilet:  Standard Discharge Bathroom Accessibility: Yes How Accessible: Accessible via walker Does the patient have any problems obtaining your medications?: Yes (Describe) ([facility] med[facility])   Social/Family/Support Systems Patient Roles: Parent Contact Information: daughter, Efraim Kaufmann Anticipated Caregiver: Gigi Gin, mom; Opie Fanton, daughter Anticipated Caregiver's Contact Information: Peggy: [phone] Caregiver Availability: 24/7 Discharge Plan Discussed with Primary Caregiver: Yes Is Caregiver In Agreement with Plan?: Yes Does Caregiver/Family have Issues with Lodging/Transportation while Pt is in Rehab?: No     Goals Patient/Family Goal for Rehab: supervision-Min A:PT/OT, Mod I: ST Expected length of stay: 16-20 days Pt/Family Agrees to Admission and willing to participate: Yes Program Orientation Provided & Reviewed with Pt/Caregiver Including Roles  & Responsibilities: Yes Additional Information Needs: Gigi Gin and Efraim Kaufmann will share caregiving responsibilities  Barriers to Discharge: Insurance for SNF coverage (uninsured)     Decrease burden of Care through IP rehab admission: NA     Possible need for SNF placement upon discharge: NA     Patient Condition: This patient's medical and functional status has changed since the consult dated: 12/17 in which the Rehabilitation Physician determined and documented that the patient's condition is appropriate for intensive rehabilitative care in an inpatient rehabilitation facility. See "History of Present Illness" (above) for medical update. Functional changes are: pt is min to mod assist for mobility with HHA, gait x6', max assist ADLs. Patient's medical and functional status update has been discussed with the Rehabilitation physician and patient remains appropriate for inpatient rehabilitation. Will admit to inpatient rehab today.   Preadmission Screen Completed By: Wolfgang Phoenix with updates by Stephania Fragmin, PT,  11/01/2020 10:27 AM ______________________________________________________________________   Discussed status with Dr. Carlis Abbott on 11/01/20 at  10:28 AM  and received approval for admission today.   Admission Coordinator: Wolfgang Phoenix and  Stephania Fragmin, time 10:28 AM Dorna Bloom 11/01/20              Cosigned by: Horton Chin, MD at 11/01/2020 10:49 AM

## 2020-11-01 NOTE — Progress Notes (Signed)
Inpatient Rehab Admissions Coordinator:    I have insurance approval and a bed available for pt to admit to CIR today. Dr. Sethi in agreement.  Will let pt/family and TOC team know.   Veeda Virgo, PT, DPT Admissions Coordinator [phone] 11/01/20  10:04 AM   

## 2020-11-01 NOTE — Plan of Care (Signed)
Mod assist with adls 

## 2020-11-01 NOTE — Progress Notes (Signed)
Inpatient Rehabilitation Medication Review by a Pharmacist  A complete drug regimen review was completed for this patient to identify any potential clinically significant medication issues.  Clinically significant medication issues were identified:  no  Time spent performing this drug regimen review (minutes):  20     Thank you for allowing [place] to participate in this patients care.   Signe Colt, PharmD Please see amion for complete clinical pharmacist phone list. 11/01/2020 5:37 PM

## 2020-11-01 NOTE — Progress Notes (Signed)
Physical Medicine and Rehabilitation Consult     Reason for Consult:  Stroke with functional deficits  Referring Physician: Dr. Roda Shutters     HPI: Rebecca Sutton is a 59 y.o. female with history of CAD, sensorineural hearing loss, binge drinking (none for the past month), CVA who was admitted on 10/25/2020 with right facial droop, right hemiparesis, and a fall.  History taken from chart review, patient, and mother.  MRI/MRI brain showed acute left internal capsule infarct with minimal flair intensity and no LVO. CTA head/neck showed acute infarct in left internal capsule, patent right ICA stent, 60% stenosis proximal L-CCA, occlusion or high-grade stenosis proximal L-PCA, occlusion of proximal B-VA with reconstitution at C6 level.  Echocardiogram with ejection fraction of 60-65%, no wall abnormality, moderate layering plaque in transverse aorta and was negative for shunt. Patient loaded with Plavix and started on DAPT with recommendations for cerebral angio for work-up. She underwent cerebral angiogram on 10/27/2020 revealing bilateral vertebral artery occlusion PCAs, 50 to 60% stenosis proximal L-CCA, 50% stenosis R-SCA proximally and 75% stenosis L-SCA proximally.   Speech therapy evaluation revealed mild to moderate flaccid dysarthria with oral motor weakness. Swallow function intact. OT evaluation reveals deficits due to right hemiparesis with impaired balance, decreased activity tolerance, and posterior lean affecting ADLs and mobility. CIR was recommended to functional decline.   Review of Systems  Constitutional: Positive for malaise/fatigue. Negative for chills and fever.  HENT: Negative for tinnitus.   Eyes: Negative for blurred vision and double vision.  Respiratory: Negative for cough.   Cardiovascular: Negative for chest pain and palpitations.  Gastrointestinal: Negative for heartburn and nausea.  Genitourinary: Negative for dysuria and urgency.  Musculoskeletal: Positive for  falls (when she's drunk). Negative for myalgias.  Skin: Negative for itching and rash.  Neurological: Positive for speech change, focal weakness and weakness. Negative for dizziness, sensory change and headaches.  All other systems reviewed and are negative.         Past Medical History:  Diagnosis Date  . CAD (coronary artery disease)    . Carotid stenosis      bilateral  . CVA (cerebral vascular accident) (HCC) 2019  . H/O ETOH abuse 08/2020  . Smoking trying to quit      1/2 PPD  . Substance abuse (HCC)      last used 20 years ago      No past surgical history          Family History  Problem Relation Age of Onset  . Diabetes Mother    . High blood pressure Mother    . Heart attack Father    . COPD Father      Social History: Lives alone and independent PTA.  She does not drive because she does not have a licence.  She reports that she has been smoking cigarettes. She has been smoking about I PPD, 0.50 packs per day. She has never used smokeless tobacco. She reports that she binge drinks --last a month ago. She reports previous drug use.          Allergies  Allergen Reactions  . Shellfish Allergy Anaphylaxis            Medications Prior to Admission  Medication Sig Dispense Refill  . aspirin-acetaminophen-caffeine (EXCEDRIN MIGRAINE) 250-250-65 MG tablet Take 2 tablets by mouth every 6 (six) hours as needed for headache.      . busPIRone (BUSPAR) 10 MG tablet Take  10 mg by mouth 2 (two) times daily.      . sertraline (ZOLOFT) 100 MG tablet Take 100 mg by mouth daily.      . traZODone (DESYREL) 150 MG tablet Take 75 mg by mouth at bedtime.          Home: Home Living Family/patient expects to be discharged to:: Private residence Living Arrangements: Non-relatives/Friends Available Help at Discharge: Family,Available PRN/intermittently Type of Home: House Home Access: Stairs to enter [employer] of Steps: 2 Entrance Stairs-Rails: Can reach  both Home Layout: One level Bathroom Shower/Tub: Engineer, manufacturing systems: Standard Home Equipment: Environmental consultant - 2 wheels,Bedside commode,Wheelchair - manual Additional Comments: has access to equipment, from late brother (passed in October)  Lives With: Alone  Functional History: Prior Function Level of Independence: Independent Comments: does everything for self, does not drive but has mother/daughter to take her places.  Has 4 cats Functional Status:  Mobility: Bed Mobility Overal bed mobility: Needs Assistance Bed Mobility: Supine to Sit Supine to sit: Mod assist,+2 for physical assistance,HOB elevated General bed mobility comments: Mod +2 for trunk elevation, LE translation to EOB. Pt able to scoot to EOB without PT or OT assist via lateral weight shifting Transfers Overall transfer level: Needs assistance Equipment used: 2 person hand held assist Transfers: Sit to/from [employer] Sit to Stand: Mod assist,+2 physical assistance Stand pivot transfers: Mod assist,+2 physical assistance General transfer comment: Mod +2 for power up, placing RLE on floor via tactile input to dorsum of foot as pt in foot supination, R knee blocking in stance, steadying, and step-by-step cuing for stepping to reach recliner placed to pt L. Ambulation/Gait General Gait Details: NT   ADL: ADL Overall ADL's : Needs assistance/impaired Eating/Feeding: Set up,Sitting Grooming: Wash/dry hands,Wash/dry face,Oral care,Brushing hair,Minimal assistance,Sitting Upper Body Bathing: Moderate assistance,Sitting Lower Body Bathing: Maximal assistance,Sit to/from stand Upper Body Dressing : Maximal assistance,Sitting Lower Body Dressing: Sit to/from stand,Moderate assistance Lower Body Dressing Details (indicate cue type and reason): able to pull sock over Lt foot once it's started over toes Toilet Transfer: +2 for physical assistance,+2 for safety/equipment,Stand-pivot,Maximal  assistance,BSC Toileting- Clothing Manipulation and Hygiene: Total assistance,Sit to/from stand Functional mobility during ADLs: Moderate assistance,+2 for physical assistance,+2 for safety/equipment   Cognition: Cognition Overall Cognitive Status: Within Functional Limits for tasks assessed Arousal/Alertness: Awake/alert Orientation Level: Oriented X4 Memory: Appears intact Awareness: Appears intact Problem Solving: Appears intact Safety/Judgment: Appears intact Cognition Arousal/Alertness: Awake/alert Behavior During Therapy: WFL for tasks assessed/performed Overall Cognitive Status: Within Functional Limits for tasks assessed General Comments: grossly intact   Blood pressure (!) 145/82, pulse 69, temperature 98 F (36.7 C), temperature source Oral, resp. rate 14, height 5\' 3"  (1.6 m), weight 67.7 kg, SpO2 97 %. Physical Exam Vitals and nursing note reviewed.  Constitutional:      Appearance: Normal appearance. She is normal weight.  HENT:     Head: Normocephalic and atraumatic.     Right Ear: External ear normal.     Left Ear: External ear normal.     Nose: Nose normal.  Eyes:     General:        Right eye: No discharge.        Left eye: No discharge.     Extraocular Movements: Extraocular movements intact.  Cardiovascular:     Rate and Rhythm: Normal rate and regular rhythm.     Heart sounds: Murmur heard.     Pulmonary:     Effort: Pulmonary effort is normal. No  respiratory distress.     Breath sounds: No stridor.  Abdominal:     General: Abdomen is flat. Bowel sounds are normal. There is no distension.  Musculoskeletal:     Cervical back: Normal range of motion and neck supple.     Comments: No edema or tenderness in extremities  Skin:    General: Skin is warm and dry.  Neurological:     Mental Status: She is alert and oriented to person, place, and time.     Comments: Alert Right facial weakness Dysarthria Motor: Right upper extremity: Shoulder abduction,  elbow flexion 2/5, elbow extension, wrist extension, handgrip 0/5 Right lower extremity: Hip flexion, knee extension 2-/5, ankle dorsiflexion 0/5 Right facial weakness HOH  Psychiatric:        Mood and Affect: Mood normal.        Behavior: Behavior normal.        Lab Results Last 24 Hours       Results for orders placed or performed during the hospital encounter of 10/25/20 (from the past 24 hour(s))  Glucose, capillary     Status: None    Collection Time: 10/26/20  9:15 PM  Result Value Ref Range    Glucose-Capillary 90 70 - 99 mg/dL  Glucose, capillary     Status: Abnormal    Collection Time: 10/26/20 10:53 PM  Result Value Ref Range    Glucose-Capillary 105 (H) 70 - 99 mg/dL  Glucose, capillary     Status: Abnormal    Collection Time: 10/27/20  4:14 AM  Result Value Ref Range    Glucose-Capillary 102 (H) 70 - 99 mg/dL    Comment 1 Notify RN      Comment 2 Document in Chart    Protime-INR     Status: None    Collection Time: 10/27/20  4:19 AM  Result Value Ref Range    Prothrombin Time 13.5 11.4 - 15.2 seconds    INR 1.1 0.8 - 1.2  Basic metabolic panel     Status: None    Collection Time: 10/27/20  4:19 AM  Result Value Ref Range    Sodium 138 135 - 145 mmol/L    Potassium 3.5 3.5 - 5.1 mmol/L    Chloride 104 98 - 111 mmol/L    CO2 25 22 - 32 mmol/L    Glucose, Bld 97 70 - 99 mg/dL    BUN 8 6 - 20 mg/dL    Creatinine, Ser 1.300.91 0.44 - 1.00 mg/dL    Calcium 9.6 8.9 - 86.510.3 mg/dL    GFR, Estimated >78>60 >46>60 mL/min    Anion gap 9 5 - 15  CBC     Status: None    Collection Time: 10/27/20  4:19 AM  Result Value Ref Range    WBC 6.3 4.0 - 10.5 K/uL    RBC 3.89 3.87 - 5.11 MIL/uL    Hemoglobin 12.5 12.0 - 15.0 g/dL    HCT 96.236.9 95.236.0 - 84.146.0 %    MCV 94.9 80.0 - 100.0 fL    MCH 32.1 26.0 - 34.0 pg    MCHC 33.9 30.0 - 36.0 g/dL    RDW 32.412.1 40.111.5 - 02.715.5 %    Platelets 258 150 - 400 K/uL    nRBC 0.0 0.0 - 0.2 %  Glucose, capillary     Status: Abnormal    Collection Time:  10/27/20  9:21 AM  Result Value Ref Range    Glucose-Capillary 110 (H) 70 - 99 mg/dL  Glucose, capillary     Status: Abnormal    Collection Time: 10/27/20  1:02 PM  Result Value Ref Range    Glucose-Capillary 100 (H) 70 - 99 mg/dL  Glucose, capillary     Status: Abnormal    Collection Time: 10/27/20  3:05 PM  Result Value Ref Range    Glucose-Capillary 127 (H) 70 - 99 mg/dL       Imaging Results (Last 48 hours)  CT ANGIO HEAD W OR WO CONTRAST   Result Date: 10/26/2020 CLINICAL DATA:  Stroke EXAM: CT ANGIOGRAPHY HEAD AND NECK TECHNIQUE: Multidetector CT imaging of the head and neck was performed using the standard protocol during bolus administration of intravenous contrast. Multiplanar CT image reconstructions and MIPs were obtained to evaluate the vascular anatomy. Carotid stenosis measurements (when applicable) are obtained utilizing NASCET criteria, using the distal internal carotid diameter as the denominator. CONTRAST:  75mL OMNIPAQUE IOHEXOL 350 MG/ML SOLN COMPARISON:  MRI head 10/26/2020 and CT head 10/25/2020 FINDINGS: CT HEAD FINDINGS Brain: Acute infarct posterior limb internal capsule on the left best seen on diffusion-weighted imaging. Small hypodensity in this area. Otherwise no acute infarct, hemorrhage, mass. Ventricle size normal. Vascular: Negative for hyperdense vessel Skull: Negative Sinuses: Complete opacification right maxillary sinus which is contracted. Remaining sinuses clear. Mastoid clear. Orbits: Negative Review of the MIP images confirms the above findings CTA NECK FINDINGS Aortic arch: Atherosclerotic calcification aortic arch and proximal great vessels. Diffuse atherosclerotic disease right subclavian artery with mild stenosis. Moderate stenosis proximal left subclavian artery and left axillary artery. 60% diameter stenosis proximal left common carotid artery. Right carotid system: Right carotid stenting across the bifurcation. Stent is widely patent. Diffuse  atherosclerotic calcification throughout the right common carotid artery. Left carotid system: Atherosclerotic disease throughout the left common carotid artery with 60% diameter stenosis of the proximal left common carotid artery. Atherosclerotic disease left carotid bifurcation without significant internal carotid artery stenosis. Severe stenosis versus occlusion of the proximal left external carotid artery. Vertebral arteries: Right vertebral dominant. 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 Occlusion of the proximal left vertebral artery with reconstitution at the C6-7 level. This vessel is diffusely diseased but patent to the basilar. Skeleton: No acute skeletal abnormality.  Poor dentition. Other neck: Negative for mass or adenopathy in the neck. Upper chest: Mild apical emphysema without acute abnormality. Apical scarring bilaterally. Review of the MIP images confirms the above findings CTA HEAD FINDINGS Anterior circulation: Mild atherosclerotic calcification in the cavernous carotid bilaterally without stenosis. Anterior and middle cerebral arteries patent without stenosis or large vessel occlusion. Posterior circulation: Both vertebral arteries patent to the basilar. PICA patent bilaterally. Basilar widely patent. AICA, superior cerebellar, posterior cerebral arteries patent without stenosis or large vessel occlusion. Venous sinuses: Normal venous enhancement. Anatomic variants: None Review of the MIP images confirms the above findings IMPRESSION: 1. Negative for intracranial large vessel occlusion or flow limiting stenosis. 2. 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 3. Severe atherosclerotic disease in the neck with extensive arterial calcification diffusely. 4. Right carotid stenting patent. 5. 60% diameter stenosis proximal left common carotid artery. Left internal carotid artery patent  without significant stenosis. Occlusion or high-grade stenosis proximal left external carotid artery. 6. 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 Electronically Signed   By: Marlan Palau M.D.   On: 10/26/2020 12:42    CT ANGIO NECK  W OR WO CONTRAST   Result Date: 10/26/2020 CLINICAL DATA:  Stroke EXAM: CT ANGIOGRAPHY HEAD AND NECK TECHNIQUE: Multidetector CT imaging of the head and neck was performed using the standard protocol during bolus administration of intravenous contrast. Multiplanar CT image reconstructions and MIPs were obtained to evaluate the vascular anatomy. Carotid stenosis measurements (when applicable) are obtained utilizing NASCET criteria, using the distal internal carotid diameter as the denominator. CONTRAST:  75mL OMNIPAQUE IOHEXOL 350 MG/ML SOLN COMPARISON:  MRI head 10/26/2020 and CT head 10/25/2020 FINDINGS: CT HEAD FINDINGS Brain: Acute infarct posterior limb internal capsule on the left best seen on diffusion-weighted imaging. Small hypodensity in this area. Otherwise no acute infarct, hemorrhage, mass. Ventricle size normal. Vascular: Negative for hyperdense vessel Skull: Negative Sinuses: Complete opacification right maxillary sinus which is contracted. Remaining sinuses clear. Mastoid clear. Orbits: Negative Review of the MIP images confirms the above findings CTA NECK FINDINGS Aortic arch: Atherosclerotic calcification aortic arch and proximal great vessels. Diffuse atherosclerotic disease right subclavian artery with mild stenosis. Moderate stenosis proximal left subclavian artery and left axillary artery. 60% diameter stenosis proximal left common carotid artery. Right carotid system: Right carotid stenting across the bifurcation. Stent is widely patent. Diffuse atherosclerotic calcification throughout the right common carotid artery. Left carotid system: Atherosclerotic disease throughout the left common carotid artery with  60% diameter stenosis of the proximal left common carotid artery. Atherosclerotic disease left carotid bifurcation without significant internal carotid artery stenosis. Severe stenosis versus occlusion of the proximal left external carotid artery. Vertebral arteries: Right vertebral dominant. 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 Occlusion of the proximal left vertebral artery with reconstitution at the C6-7 level. This vessel is diffusely diseased but patent to the basilar. Skeleton: No acute skeletal abnormality.  Poor dentition. Other neck: Negative for mass or adenopathy in the neck. Upper chest: Mild apical emphysema without acute abnormality. Apical scarring bilaterally. Review of the MIP images confirms the above findings CTA HEAD FINDINGS Anterior circulation: Mild atherosclerotic calcification in the cavernous carotid bilaterally without stenosis. Anterior and middle cerebral arteries patent without stenosis or large vessel occlusion. Posterior circulation: Both vertebral arteries patent to the basilar. PICA patent bilaterally. Basilar widely patent. AICA, superior cerebellar, posterior cerebral arteries patent without stenosis or large vessel occlusion. Venous sinuses: Normal venous enhancement. Anatomic variants: None Review of the MIP images confirms the above findings IMPRESSION: 1. Negative for intracranial large vessel occlusion or flow limiting stenosis. 2. 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 3. Severe atherosclerotic disease in the neck with extensive arterial calcification diffusely. 4. Right carotid stenting patent. 5. 60% diameter stenosis proximal left common carotid artery. Left internal carotid artery patent without significant stenosis. Occlusion or high-grade stenosis proximal left external carotid artery. 6. Occlusion of the proximal vertebral artery bilaterally with  reconstitution at the C6 level. Electronically Signed   By: Marlan Palau M.D.   On: 10/26/2020 12:42    MR BRAIN WO CONTRAST   Result Date: 10/26/2020 CLINICAL DATA:  Stroke, follow-up; status post tPA for ischemic stroke. Right facial droop and right-sided weakness. EXAM: MRI HEAD WITHOUT CONTRAST TECHNIQUE: Multiplanar, multiecho pulse sequences of the brain and surrounding structures were obtained without intravenous contrast. COMPARISON:  MRI/MRA head 10/25/2020, head CT 10/25/2020. FINDINGS: Brain: Mild intermittent motion degradation. Mild cerebral atrophy. An acute infarct within the left thalamocapsular junction has slightly increased in size as compared to the brain MRI  performed earlier today, now measuring 17 mm (previously 15 mm). Corresponding T2/FLAIR hyperintensity at this site. Background mild multifocal T2/FLAIR hyperintensity within the cerebral white matter and pons is nonspecific, but compatible with chronic small vessel ischemic disease. No evidence of intracranial mass. No chronic intracranial blood products. No extra-axial fluid collection. No midline shift. Vascular: Expected proximal arterial flow voids. Skull and upper cervical spine: No focal marrow lesion. Sinuses/Orbits: Visualized orbits show no acute finding. Complete T2 hyperintense opacification of the right maxillary sinus. Mild ethmoid sinus mucosal thickening. IMPRESSION: 17 mm acute infarct within the left thalamocapsular junction, minimally increased in size as compared to the brain MRI performed earlier today. Background mild cerebral atrophy and chronic small vessel ischemic disease. Right maxillary sinusitis. Electronically Signed   By: Jackey Loge DO   On: 10/26/2020 11:21       Assessment/Plan: Diagnosis: Acute left internal capsule infarct  Stroke: Continue secondary stroke prophylaxis and Risk Factor Modification listed below:   Antiplatelet therapy:   Blood Pressure Management:  Continue current  medication with prn's with permisive HTN per primary team Statin Agent:   Tobacco abuse:   Right sided hemiparesis: fit for orthosis to prevent contractures (resting hand splint for day, wrist cock up splint at night, PRAFO, etc) PT/OT for mobility, ADL training  Motor recovery: Zoloft Labs independently reviewed.  Records reviewed and summated above.   1. Does the need for close, 24 hr/day medical supervision in concert with the patient's rehab needs make it unreasonable for this patient to be served in a less intensive setting? Yes  2. Co-Morbidities requiring supervision/potential complications: CAD, sensorineural hearing loss, binge drinking (none for the past month), CVA, hyperglycemia (likely stress-induced-continue to monitor), right hemiparesis 3. Due to safety, disease management and patient education, does the patient require 24 hr/day rehab nursing? Yes 4. Does the patient require coordinated care of a physician, rehab nurse, therapy disciplines of PT/OT/SLP to address physical and functional deficits in the context of the above medical diagnosis(es)? Yes Addressing deficits in the following areas: balance, endurance, locomotion, strength, transferring, bathing, dressing, toileting, speech and psychosocial support 5. Can the patient actively participate in an intensive therapy program of at least 3 hrs of therapy per day at least 5 days per week? Yes 6. The potential for patient to make measurable gains while on inpatient rehab is excellent 7. Anticipated functional outcomes upon discharge from inpatient rehab are supervision and min assist  with PT, supervision and min assist with OT, modified independent with SLP. 8. Estimated rehab length of stay to reach the above functional goals is: 16-20 days. 9. Anticipated discharge destination: Home 10. Overall Rehab/Functional Prognosis: good   RECOMMENDATIONS: This patient's condition is appropriate for continued rehabilitative care in the  following setting: CIR Patient has agreed to participate in recommended program. Yes Note that insurance prior authorization may be required for reimbursement for recommended care.   Comment: Rehab Admissions Coordinator to follow up.   I have personally performed a face to face diagnostic evaluation, including, but not limited to relevant history and physical exam findings, of this patient and developed relevant assessment and plan.  Additionally, I have reviewed and concur with the physician assistant's documentation above.    Maryla Morrow, MD, ABPMR Jacquelynn Cree, PA-C 10/27/2020

## 2020-11-01 NOTE — H&P (Addendum)
Physical Medicine and Rehabilitation Admission H&P    Chief Complaint  Patient presents with  . Functional deficits due to stroke  . Weakness    Right side     HPI: Rebecca Sutton is a 59 year old RHp female with history of CAD, sensorineural hearing loss, brings drinking, CVA in the past; was admitted on 10/25/2020 with right facial droop, right hemiparesis and a fall.  MRI/MRA brain showed acute left internal capsule infarct with minimal intensity and no LVO.  CTA head/neck showed acute infarct left internal capsule, patent right ICA stent, 60% stenosis proximal left left CCA, occlusion or high-grade stenosis proximal left-PCA, occlusion of proximal bilateral VA with reconstitution at C6 level.  2D echo showed EF of 60 to 65% with no wall abnormality and moderate layering plaque noted in the transverse aorta.  Patient was loaded on Plavix and was started on DAPT.    She underwent cerebral angiogram on 12/17 revealing bilateral vertebral artery occlusion, 40 to 60% stenosis proximal left CCA, 50% stenosis.-SCA proximally and 75% stenosis left ICA proximally.  Carotid ultrasound done showing mild 1 to 39% left-ICA stenosis and left carotid stent not felt necessary per discussion with interventional radiology.  Stroke felt to be due to small vessel disease and she is to continue DAPT for secondary stroke prevention. Admit to CIR today with right-sided weakness with balance deficits and decreased activity tolerance affecting ADLs and mobility. She currently has some cough, no phlegm production, eager to participate with therapy.    Review of Systems  Constitutional: Negative for chills and fever.  HENT: Positive for hearing loss (total in left and 50% in  right. ).   Eyes: Negative for blurred vision and double vision.  Respiratory: Negative for cough and shortness of breath.   Cardiovascular: Negative for chest pain and palpitations.  Gastrointestinal: Negative for constipation, diarrhea,  heartburn and nausea.  Genitourinary: Negative for dysuria and urgency.  Musculoskeletal: Positive for falls (when drunk).  Neurological: Positive for speech change and weakness.     Past Medical History:  Diagnosis Date  . CAD (coronary artery disease)   . Carotid stenosis    bilateral  . CVA (cerebral vascular accident) (HCC) 2019  . H/O ETOH abuse 08/2020  . Smoking trying to quit    1/2 PPD  . Substance abuse (HCC)    last used 20 years ago    Past Surgical History:  Procedure Laterality Date  . IR ANGIO INTRA EXTRACRAN SEL COM CAROTID INNOMINATE BILAT MOD SED  10/27/2020  . IR ANGIO VERTEBRAL SEL SUBCLAVIAN INNOMINATE BILAT MOD SED  10/27/2020    Family History  Problem Relation Age of Onset  . Diabetes Mother   . High blood pressure Mother   . Heart attack Father   . COPD Father   . Heart disease Paternal Uncle     Social History:   Lives alone and independent PTA. She does not drive due to lack of license.  She reports that she has been smoking 1 pack/day.  She does not use any smokeless tobacco.  She reports she binge drinks with last episode about a month ago.  She has a history of prior drug use.   Allergies  Allergen Reactions  . Shellfish Allergy Anaphylaxis    Throat closes up    Medications Prior to Admission  Medication Sig Dispense Refill  . aspirin-acetaminophen-caffeine (EXCEDRIN MIGRAINE) 250-250-65 MG tablet Take 2 tablets by mouth every 6 (six) hours as needed for  headache.    . busPIRone (BUSPAR) 10 MG tablet Take 10 mg by mouth 2 (two) times daily.    . sertraline (ZOLOFT) 100 MG tablet Take 100 mg by mouth daily.    . traZODone (DESYREL) 150 MG tablet Take 75 mg by mouth at bedtime.      Drug Regimen Review  Drug regimen was reviewed and remains appropriate with no significant issues identified  Home: Home Living Family/patient expects to be discharged to:: Private residence Living Arrangements: Non-relatives/Friends Available Help at  Discharge: Family,Available PRN/intermittently Type of Home: House Home Access: Stairs to enter [employer] of Steps: 2 Entrance Stairs-Rails: Right,Left Home Layout: One level Bathroom Shower/Tub: Engineer, manufacturing systems: Standard Bathroom Accessibility: Yes Home Equipment: Walker - 2 wheels,Bedside commode,Wheelchair - manual Additional Comments: has access to equipment, from late brother (passed in October)  Lives With: Other (Comment) (roomate. May not live with him anymore in near future)   Functional History: Prior Function Level of Independence: Independent Comments: does everything for self, does not drive but has mother/daughter to take her places.  Has 4 cats  Functional Status:  Mobility: Bed Mobility Overal bed mobility: Needs Assistance Bed Mobility: Supine to Sit Supine to sit: HOB elevated,Supervision General bed mobility comments: increased time with supervision for lines Transfers Overall transfer level: Needs assistance Equipment used: 1 person hand held assist Transfers: Sit to/from Stand Sit to Stand: Min assist Stand pivot transfers: Mod assist,+2 physical assistance General transfer comment: Repeated x 5 for RLE strengthening (using RUE only to assist forcing use of RLE) Ambulation/Gait Ambulation/Gait assistance: Min assist,Mod assist Gait Distance (Feet): 6 Feet (standing activities at sink, fatigued and mod assist to walk 4 ft to recliner) Assistive device: 1 person hand held assist (at end, Lt HHA and RUE on counter) Gait Pattern/deviations: Step-to pattern,Decreased stance time - right,Decreased weight shift to right,Decreased dorsiflexion - right General Gait Details: Ambulated to sink with Lt HHA with no rt knee buckling, cues for knee extension. As pt fatigued, R knee with mild buckling as walking sink to recliner. Gait velocity: decreased  ADL: ADL Overall ADL's : Needs assistance/impaired Eating/Feeding: Set  up,Sitting Grooming: Oral care,Standing,Minimal assistance,Moderate assistance,Cueing for sequencing,Cueing for compensatory techniques Grooming Details (indicate cue type and reason): MIN - MOD A for standing balance, cues to incorporate RUE as gross assist Upper Body Bathing: Moderate assistance,Sitting Lower Body Bathing: Maximal assistance,Sit to/from stand Upper Body Dressing : Maximal assistance,Sitting Lower Body Dressing: Total assistance,Bed level Lower Body Dressing Details (indicate cue type and reason): to don socks from bed level Toilet Transfer: Minimal assistance,+2 for physical assistance,Ambulation Toilet Transfer Details (indicate cue type and reason): simulated via functional mobility Toileting- Clothing Manipulation and Hygiene: Total assistance,Sit to/from stand Functional mobility during ADLs: Minimal assistance,+2 for physical assistance General ADL Comments: pt continue to present with R sided weakness, imparied balance, and decreased activity tolerance  Cognition: Cognition Overall Cognitive Status: Within Functional Limits for tasks assessed Arousal/Alertness: Awake/alert Orientation Level: Oriented X4 Memory: Appears intact Awareness: Appears intact Problem Solving: Appears intact Safety/Judgment: Appears intact Cognition Arousal/Alertness: Awake/alert Behavior During Therapy: WFL for tasks assessed/performed Overall Cognitive Status: Within Functional Limits for tasks assessed General Comments: grossly intact   Blood pressure (!) 155/74, temperature 98.3 F (36.8 C), resp. rate 16. Physical Exam General: Alert, No apparent distress HEENT: Head is normocephalic, atraumatic, PERRLA, EOMI, sclera anicteric, oral mucosa pink and moist, dentition intact, ext ear canals clear,  Neck: Supple without JVD or lymphadenopathy Heart: Reg rate and rhythm.  No murmurs rubs or gallops Chest: CTA bilaterally without wheezes, rales, or rhonchi; no distress Abdomen:  Soft, non-tender, non-distended, bowel sounds positive. Extremities: No clubbing, cyanosis, or edema. Pulses are 2+ Skin: Multiple healed scars on bilateral forearms.   Neuro: Pt is cognitively appropriate with normal insight, memory, and awareness. Right facial weakness with mild dysarthria. Right sided weakness. Able to follow simple motor commands without difficulty.  3/5 strength throughout right side with the exception of 0/5 DF/PF Musculoskeletal: Full ROM, No pain with AROM or PROM in the neck, trunk, or extremities. Posture appropriate Psych: Pt's affect is appropriate. Pt is cooperative       Results for orders placed or performed during the hospital encounter of 10/25/20 (from the past 48 hour(s))  Glucose, capillary     Status: Abnormal   Collection Time: 10/30/20  3:59 PM  Result Value Ref Range   Glucose-Capillary 126 (H) 70 - 99 mg/dL    Comment: Glucose reference range applies only to samples taken after fasting for at least 8 hours.  Glucose, capillary     Status: Abnormal   Collection Time: 10/30/20  7:46 PM  Result Value Ref Range   Glucose-Capillary 129 (H) 70 - 99 mg/dL    Comment: Glucose reference range applies only to samples taken after fasting for at least 8 hours.   Comment 1 Notify RN    Comment 2 Document in Chart   Glucose, capillary     Status: Abnormal   Collection Time: 10/30/20 11:39 PM  Result Value Ref Range   Glucose-Capillary 109 (H) 70 - 99 mg/dL    Comment: Glucose reference range applies only to samples taken after fasting for at least 8 hours.   Comment 1 Notify RN    Comment 2 Document in Chart   Basic metabolic panel     Status: Abnormal   Collection Time: 10/31/20  1:57 AM  Result Value Ref Range   Sodium 134 (L) 135 - 145 mmol/L   Potassium 4.0 3.5 - 5.1 mmol/L   Chloride 96 (L) 98 - 111 mmol/L   CO2 26 22 - 32 mmol/L   Glucose, Bld 99 70 - 99 mg/dL    Comment: Glucose reference range applies only to samples taken after fasting for  at least 8 hours.   BUN 13 6 - 20 mg/dL   Creatinine, Ser 5.46 0.44 - 1.00 mg/dL   Calcium 9.9 8.9 - 27.0 mg/dL   GFR, Estimated >35 >00 mL/min    Comment: (NOTE) Calculated using the CKD-EPI Creatinine Equation (2021)    Anion gap 12 5 - 15    Comment: Performed at [facility], [address], [place], [place] [address]  Glucose, capillary     Status: Abnormal   Collection Time: 10/31/20  3:13 AM  Result Value Ref Range   Glucose-Capillary 109 (H) 70 - 99 mg/dL    Comment: Glucose reference range applies only to samples taken after fasting for at least 8 hours.   Comment 1 Notify RN    Comment 2 Document in Chart   CBC with Differential/Platelet     Status: Abnormal   Collection Time: 10/31/20  5:02 AM  Result Value Ref Range   WBC 7.9 4.0 - 10.5 K/uL   RBC 3.72 (L) 3.87 - 5.11 MIL/uL   Hemoglobin 12.4 12.0 - 15.0 g/dL   HCT 29.9 (L) 37.1 - 69.6 %   MCV 92.5 80.0 - 100.0 fL   MCH 33.3 26.0 -  34.0 pg   MCHC 36.0 30.0 - 36.0 g/dL   RDW 84.612.0 96.211.5 - 95.215.5 %   Platelets 278 150 - 400 K/uL   nRBC 0.0 0.0 - 0.2 %   Neutrophils Relative % 65 %   Neutro Abs 5.1 1.7 - 7.7 K/uL   Lymphocytes Relative 19 %   Lymphs Abs 1.5 0.7 - 4.0 K/uL   Monocytes Relative 11 %   Monocytes Absolute 0.9 0.1 - 1.0 K/uL   Eosinophils Relative 5 %   Eosinophils Absolute 0.4 0.0 - 0.5 K/uL   Basophils Relative 0 %   Basophils Absolute 0.0 0.0 - 0.1 K/uL   Immature Granulocytes 0 %   Abs Immature Granulocytes 0.02 0.00 - 0.07 K/uL    Comment: Performed at [facility], [address] [place], [place] [id number]  Glucose, capillary     Status: Abnormal   Collection Time: 10/31/20 11:45 PM  Result Value Ref Range   Glucose-Capillary 115 (H) 70 - 99 mg/dL    Comment: Glucose reference range applies only to samples taken after fasting for at least 8 hours.  Glucose, capillary     Status: Abnormal   Collection Time: 11/01/20  3:44 AM  Result Value Ref Range   Glucose-Capillary 109 (H) 70 -  99 mg/dL    Comment: Glucose reference range applies only to samples taken after fasting for at least 8 hours.  Glucose, capillary     Status: Abnormal   Collection Time: 11/01/20  8:13 AM  Result Value Ref Range   Glucose-Capillary 101 (H) 70 - 99 mg/dL    Comment: Glucose reference range applies only to samples taken after fasting for at least 8 hours.  Glucose, capillary     Status: Abnormal   Collection Time: 11/01/20 11:43 AM  Result Value Ref Range   Glucose-Capillary 139 (H) 70 - 99 mg/dL    Comment: Glucose reference range applies only to samples taken after fasting for at least 8 hours.   No results found.     Medical Problem List and Plan: 1.  Impaired mobility and ADLs secondary to left lacunar infarct  -patient may shower  -ELOS/Goals: 16-20 days S-MinA 2.  Antithrombotics: -DVT/anticoagulation:  Pharmaceutical: Lovenox  -antiplatelet therapy:DAPT 3. Pain Management: Pain is well controlled- continue Tylenol prn  4. Mood: LCSW to follow for evaluation and support.   -antipsychotic agents: N/A 5. Neuropsych: This patient is capable of making decisions on herr own behalf. 6. Skin/Wound Care: Routine pressure relief measures.  7. Fluids/Electrolytes/Nutrition: Monitor I/O. Hyponatremic to 134 on 12/21, repeat tomorrow.   8. HTN: BP labile- continue to monitor TID 9. Anxiety d/o: Continue buspar tid 10 H/o depression: On Zoloft with trazodone to help manage insomnia.  11. Dyslipidemia: Continue high dose atorvastatin.   Jacquelynn CreePamela S Love, PA-C  I have personally performed a face to face diagnostic evaluation, including, but not limited to relevant history and physical exam findings, of this patient and developed relevant assessment and plan.  Additionally, I have reviewed and concur with the physician assistant's documentation above.  Horton ChinKrutika P Arriyana Rodell, MD 11/01/2020

## 2020-11-01 NOTE — Progress Notes (Signed)
Patient arrived to unit no s/s of distress noted at this time. Patient resting in bed that is in lowest position and call bell in reach. Jay Schlichter, LPN

## 2020-11-01 NOTE — TOC Transition Note (Signed)
Transition of Care [facility]) - CM/SW Discharge Note   Patient Details  Name: Rebecca Sutton MRN: [id number] Date of Birth: 25-Jan-1961  [facility] of [facility]) CM/SW Contact:  Kermit Balo, RN Phone Number: 11/01/2020, 10:33 AM   Clinical Narrative:    Pt discharging to CIR today. CM signing off.    Final next level of care: IP Rehab Facility Barriers to Discharge: No Barriers Identified   Patient Goals and CMS Choice        Discharge Placement                       Discharge Plan and Services                                     Social Determinants of Health (SDOH) Interventions     Readmission Risk Interventions No flowsheet data found.

## 2020-11-02 ENCOUNTER — Inpatient Hospital Stay (HOSPITAL_COMMUNITY): Payer: Medicaid Other | Admitting: Occupational Therapy

## 2020-11-02 ENCOUNTER — Inpatient Hospital Stay (HOSPITAL_COMMUNITY): Payer: Medicaid Other | Admitting: Speech Pathology

## 2020-11-02 ENCOUNTER — Inpatient Hospital Stay (HOSPITAL_COMMUNITY): Payer: Medicaid Other

## 2020-11-02 LAB — COMPREHENSIVE METABOLIC PANEL
ALT: 20 U/L (ref 0–44)
AST: 22 U/L (ref 15–41)
Albumin: 3.3 g/dL — ABNORMAL LOW (ref 3.5–5.0)
Alkaline Phosphatase: 71 U/L (ref 38–126)
Anion gap: 9 (ref 5–15)
BUN: 18 mg/dL (ref 6–20)
CO2: 26 mmol/L (ref 22–32)
Calcium: 9.4 mg/dL (ref 8.9–10.3)
Chloride: 101 mmol/L (ref 98–111)
Creatinine, Ser: 0.98 mg/dL (ref 0.44–1.00)
GFR, Estimated: >60 mL/min (ref >60)
Glucose, Bld: 108 mg/dL — ABNORMAL HIGH (ref 70–99)
Potassium: 3.7 mmol/L (ref 3.5–5.1)
Sodium: 136 mmol/L (ref 135–145)
Total Bilirubin: 0.5 mg/dL (ref 0.3–1.2)
Total Protein: 6.4 g/dL — ABNORMAL LOW (ref 6.5–8.1)

## 2020-11-02 LAB — CBC WITH DIFFERENTIAL/PLATELET
Abs Immature Granulocytes: 0.03 10*3/uL (ref 0.00–0.07)
Basophils Absolute: 0.1 10*3/uL (ref 0.0–0.1)
Basophils Relative: 1 %
Eosinophils Absolute: 0.3 10*3/uL (ref 0.0–0.5)
Eosinophils Relative: 5 %
HCT: 35.1 % — ABNORMAL LOW (ref 36.0–46.0)
Hemoglobin: 12 g/dL (ref 12.0–15.0)
Immature Granulocytes: 1 %
Lymphocytes Relative: 33 %
Lymphs Abs: 2.1 10*3/uL (ref 0.7–4.0)
MCH: 32.4 pg (ref 26.0–34.0)
MCHC: 34.2 g/dL (ref 30.0–36.0)
MCV: 94.9 fL (ref 80.0–100.0)
Monocytes Absolute: 0.6 10*3/uL (ref 0.1–1.0)
Monocytes Relative: 10 %
Neutro Abs: 3.2 10*3/uL (ref 1.7–7.7)
Neutrophils Relative %: 50 %
Platelets: 268 10*3/uL (ref 150–400)
RBC: 3.7 MIL/uL — ABNORMAL LOW (ref 3.87–5.11)
RDW: 11.9 % (ref 11.5–15.5)
WBC: 6.4 10*3/uL (ref 4.0–10.5)
nRBC: 0 % (ref 0.0–0.2)

## 2020-11-02 NOTE — Plan of Care (Signed)
°  Problem: RH Balance Goal: LTG: Patient will maintain dynamic sitting balance (OT) Description: LTG:  Patient will maintain dynamic sitting balance with assistance during activities of daily living (OT) Flowsheets (Taken 11/02/2020 1238) LTG: Pt will maintain dynamic sitting balance during ADLs with: Independent with assistive device Goal: LTG Patient will maintain dynamic standing with ADLs (OT) Description: LTG:  Patient will maintain dynamic standing balance with assist during activities of daily living (OT)  Flowsheets (Taken 11/02/2020 1238) LTG: Pt will maintain dynamic standing balance during ADLs with: Supervision/Verbal cueing   Problem: Sit to Stand Goal: LTG:  Patient will perform sit to stand in prep for activites of daily living with assistance level (OT) Description: LTG:  Patient will perform sit to stand in prep for activites of daily living with assistance level (OT) Flowsheets (Taken 11/02/2020 1238) LTG: PT will perform sit to stand in prep for activites of daily living with assistance level: Supervision/Verbal cueing   Problem: RH Eating Goal: LTG Patient will perform eating w/assist, cues/equip (OT) Description: LTG: Patient will perform eating with assist, with/without cues using equipment (OT) Flowsheets (Taken 11/02/2020 1238) LTG: Pt will perform eating with assistance level of: Independent with assistive device    Problem: RH Grooming Goal: LTG Patient will perform grooming w/assist,cues/equip (OT) Description: LTG: Patient will perform grooming with assist, with/without cues using equipment (OT) Flowsheets (Taken 11/02/2020 1238) LTG: Pt will perform grooming with assistance level of: Independent with assistive device    Problem: RH Bathing Goal: LTG Patient will bathe all body parts with assist levels (OT) Description: LTG: Patient will bathe all body parts with assist levels (OT) Flowsheets (Taken 11/02/2020 1238) LTG: Pt will perform bathing with  assistance level/cueing: Supervision/Verbal cueing   Problem: RH Dressing Goal: LTG Patient will perform upper body dressing (OT) Description: LTG Patient will perform upper body dressing with assist, with/without cues (OT). Flowsheets (Taken 11/02/2020 1238) LTG: Pt will perform upper body dressing with assistance level of: Independent with assistive device Goal: LTG Patient will perform lower body dressing w/assist (OT) Description: LTG: Patient will perform lower body dressing with assist, with/without cues in positioning using equipment (OT) Flowsheets (Taken 11/02/2020 1238) LTG: Pt will perform lower body dressing with assistance level of: Supervision/Verbal cueing   Problem: RH Toileting Goal: LTG Patient will perform toileting task (3/3 steps) with assistance level (OT) Description: LTG: Patient will perform toileting task (3/3 steps) with assistance level (OT)  Flowsheets (Taken 11/02/2020 1238) LTG: Pt will perform toileting task (3/3 steps) with assistance level: Supervision/Verbal cueing   Problem: RH Functional Use of Upper Extremity Goal: LTG Patient will use RT/LT upper extremity as a (OT) Description: LTG: Patient will use right/left upper extremity as a stabilizer/gross assist/diminished/nondominant/dominant level with assist, with/without cues during functional activity (OT) Flowsheets (Taken 11/02/2020 1238) LTG: Use of upper extremity in functional activities: RUE as nondominant level   Problem: RH Toilet Transfers Goal: LTG Patient will perform toilet transfers w/assist (OT) Description: LTG: Patient will perform toilet transfers with assist, with/without cues using equipment (OT) Flowsheets (Taken 11/02/2020 1238) LTG: Pt will perform toilet transfers with assistance level of: Supervision/Verbal cueing   Problem: RH Tub/Shower Transfers Goal: LTG Patient will perform tub/shower transfers w/assist (OT) Description: LTG: Patient will perform tub/shower transfers  with assist, with/without cues using equipment (OT) Flowsheets (Taken 11/02/2020 1238) LTG: Pt will perform tub/shower stall transfers with assistance level of: Supervision/Verbal cueing

## 2020-11-02 NOTE — Evaluation (Signed)
Speech Language Pathology Assessment and Plan  Patient Details  Name: Rebecca Sutton MRN: [id number] Date of Birth: 10-29-1961  SLP Diagnosis: Dysarthria;Dysphagia  Rehab Potential: Excellent ELOS: 16-20 days    Today's Date: 11/02/2020 SLP Individual Time: 0800-0900 SLP Individual Time Calculation (min): 65 min   Hospital Problem: Principal Problem:   Left sided lacunar infarction [facility]) Active Problems:   Sensorineural hearing loss (SNHL)   Coronary artery disease involving native coronary artery of native heart without angina pectoris   Alcohol consumption binge drinking   History of CVA (cerebrovascular accident)   Right hemiparesis ([place])   Hyperglycemia   Stroke [facility])  Past Medical History:  Past Medical History:  Diagnosis Date  . CAD (coronary artery disease)   . Carotid stenosis    bilateral  . CVA (cerebral vascular accident) ([place]) 2019  . H/O ETOH abuse 08/2020  . Smoking trying to quit    1/2 PPD  . Substance abuse ([place])    last used 20 years ago   Past Surgical History:  Past Surgical History:  Procedure Laterality Date  . IR ANGIO INTRA EXTRACRAN SEL COM CAROTID INNOMINATE BILAT MOD SED  10/27/2020  . IR ANGIO VERTEBRAL SEL SUBCLAVIAN INNOMINATE BILAT MOD SED  10/27/2020    Assessment / Plan / Recommendation Clinical Impression   Rebecca Sutton is a 59 year old RHp female with history of CAD, sensorineural hearing loss, brings drinking, CVA in the past; was admitted on 10/25/2020 with right facial droop, right hemiparesis and a fall.  MRI/MRA brain showed acute left internal capsule infarct with minimal intensity and no LVO.  CTA head/neck showed acute infarct left internal capsule, patent right ICA stent, 60% stenosis proximal left left CCA, occlusion or high-grade stenosis proximal left-PCA, occlusion of proximal bilateral VA with reconstitution at C6 level.  2D echo showed EF of 60 to 65% with no wall abnormality and moderate layering plaque noted in the  transverse aorta.  Patient was loaded on Plavix and was started on DAPT.    She underwent cerebral angiogram on 12/17 revealing bilateral vertebral artery occlusion, 40 to 60% stenosis proximal left CCA, 50% stenosis.-SCA proximally and 75% stenosis left ICA proximally.  Carotid ultrasound done showing mild 1 to 39% left-ICA stenosis and left carotid stent not felt necessary per discussion with interventional radiology.  Stroke felt to be due to small vessel disease and she is to continue DAPT for secondary stroke prevention. Admit to CIR today with right-sided weakness with balance deficits and decreased activity tolerance affecting ADLs and mobility. She currently has some cough, no phlegm production, eager to participate with therapy.  SLP evaluation was completed on 11/02/20 with results as follows: Bedside Swallow Evaluation: Pt presents with a mild oral dysphagia resulting from right sided weakness which, in combination with missing teeth, leads to prolonged mastication of solids.  Pt has some residue in the oral cavity post swallow which she is able to clear with increased time.  Pt reports that there are certain textures which she hasn't attempted to eat yet due to being cautious, such as breads.   No overt s/s of aspiration were evident with solids or liquids on this date.  As a result, I recommend that pt remain on her currently prescribed diet with SLP follow up for 1-2 additional sessions to monitor toleration of more challenging consistencies.   Cognitive-linguistic Evaluation: Pt and daughter both report pt to be at baseline for cognition.  Pt currently presents with a mild dysarthria resulting  from right sided oral motor weakness which leads to imprecise articulation of consonants and decreased intelligibility at the conversational level, requiring intermittent min assist verbal cues to correct.  Her speech is otherwise fluent and free of word finding deficits.   Given the abovementioned  deficits, pt would benefit from skilled ST while inpatient in order to maximize functional independence and reduce burden of care prior to discharge.  Pt may not need ST follow up at discharge as she is already exhibiting quick carryover of compensatory strategy use for swallowing and speech intelligibility.     Skilled Therapeutic Interventions          Cognitive-linguistic evaluation completed with results and recommendations reviewed with patient and family.     SLP Assessment  Patient will need skilled Speech Lanaguage Pathology Services during CIR admission    Recommendations  SLP Diet Recommendations: Age appropriate regular solids;Thin Medication Administration: Whole meds with liquid Supervision: Patient able to self feed Compensations: Slow rate;Small sips/bites;Lingual sweep for clearance of pocketing Postural Changes and/or Swallow Maneuvers: Seated upright 90 degrees Oral Care Recommendations: Oral care BID Patient destination: Home Follow up Recommendations: Other (comment) (TBD  pending progress made while inpatient) Equipment Recommended: None recommended by SLP    SLP Frequency 1 to 3 out of 7 days   SLP Duration  SLP Intensity  SLP Treatment/Interventions 16-20 days  Minumum of 1-2 x/day, 30 to 90 minutes  Cueing hierarchy;Dysphagia/aspiration precaution training;Functional tasks;Patient/family education;Speech/Language facilitation;Internal/external aids;Environmental controls    Pain Pain Assessment Pain Scale: 0-10 Pain Score: 0-No pain  Prior Functioning Cognitive/Linguistic Baseline: Within functional limits Type of Home: House  Lives With: Other (Comment) (roommate) Available Help at Discharge: Family;Available PRN/intermittently  SLP Evaluation Cognition Overall Cognitive Status: Within Functional Limits for tasks assessed  Comprehension Auditory Comprehension Overall Auditory Comprehension: Appears within functional limits for tasks  assessed Expression Expression Primary Mode of Expression: Verbal Verbal Expression Overall Verbal Expression: Appears within functional limits for tasks assessed Oral Motor Oral Motor/Sensory Function Overall Oral Motor/Sensory Function: Mild impairment Facial ROM: Reduced right Facial Symmetry: Abnormal symmetry right Facial Strength: Reduced right Facial Sensation: Reduced right Lingual ROM: Within Functional Limits Lingual Symmetry: Abnormal symmetry right Lingual Strength: Within Functional Limits Lingual Sensation: Reduced Motor Speech Overall Motor Speech: Impaired Respiration: Within functional limits Resonance: Within functional limits Articulation: Impaired Level of Impairment: Conversation Conversation: 75-100% accurate Motor Planning: Witnin functional limits  Care Tool Care Tool Cognition Expression of Ideas and Wants Expression of Ideas and Wants: Some difficulty - exhibits some difficulty with expressing needs and ideas (e.g, some words or finishing thoughts) or speech is not clear   Understanding Verbal and Non-Verbal Content Understanding Verbal and Non-Verbal Content: Understands (complex and basic) - clear comprehension without cues or repetitions   Memory/Recall Ability *first 3 days only Memory/Recall Ability *first 3 days only: Current season;Staff names and faces;That he or she is in a hospital/hospital unit     Bedside Swallowing Assessment General Date of Onset: 10/25/20 Temperature Spikes Noted: No Respiratory Status: Room air History of Recent Intubation: No Behavior/Cognition: Alert;Cooperative;Pleasant mood Oral Cavity - Dentition: Missing dentition Self-Feeding Abilities: Able to feed self;Needs set up Vision: Functional for self-feeding Patient Positioning: Upright in bed Baseline Vocal Quality: Normal Volitional Cough: Strong Volitional Swallow: Able to elicit  Oral Care Assessment   Ice Chips   Thin Liquid Thin Liquid: Within  functional limits Nectar Thick   Honey Thick   Puree   Solid Solid: Impaired Oral Phase Functional Implications: Prolonged oral  transit;Impaired mastication BSE Assessment Risk for Aspiration Impact on safety and function: Mild aspiration risk  Short Term Goals: Week 1: SLP Short Term Goal 1 (Week 1): Pt will use slow rate, inreased vocal intensity, and overarticulation to achieve intelligibility at the conversational level with supervision. SLP Short Term Goal 2 (Week 1): Pt will consume regular textures and thin liquids with mod I use of swallowing precautions and minimal overt s/s of aspiration.  Refer to Care Plan for Long Term Goals  Recommendations for other services: None   Discharge Criteria: Patient will be discharged from SLP if patient refuses treatment 3 consecutive times without medical reason, if treatment goals not met, if there is a change in medical status, if patient makes no progress towards goals or if patient is discharged from hospital.  The above assessment, treatment plan, treatment alternatives and goals were discussed and mutually agreed upon: by patient  Emilio Math 11/02/2020, 11:19 AM

## 2020-11-02 NOTE — Evaluation (Signed)
Physical Therapy Assessment and Plan  Patient Details  Name: Rebecca Sutton MRN: [id number] Date of Birth: November 08, 1961  PT Diagnosis: Difficulty walking, Hemiplegia dominant and Muscle weakness Rehab Potential: Good ELOS: 10-12 days   Today's Date: 11/02/2020 PT Individual Time: 2703-5009 PT Individual Time Calculation (min): 86 min    Hospital Problem: Principal Problem:   Left sided lacunar infarction [facility]) Active Problems:   Sensorineural hearing loss (SNHL)   Coronary artery disease involving native coronary artery of native heart without angina pectoris   Alcohol consumption binge drinking   History of CVA (cerebrovascular accident)   Right hemiparesis ([place])   Hyperglycemia   Stroke [facility])   Past Medical History:  Past Medical History:  Diagnosis Date  . CAD (coronary artery disease)   . Carotid stenosis    bilateral  . CVA (cerebral vascular accident) ([place]) 2019  . H/O ETOH abuse 08/2020  . Smoking trying to quit    1/2 PPD  . Substance abuse ([place])    last used 20 years ago   Past Surgical History:  Past Surgical History:  Procedure Laterality Date  . IR ANGIO INTRA EXTRACRAN SEL COM CAROTID INNOMINATE BILAT MOD SED  10/27/2020  . IR ANGIO VERTEBRAL SEL SUBCLAVIAN INNOMINATE BILAT MOD SED  10/27/2020    Assessment & Plan Clinical Impression: Patient is a 59 year old RHp female with history of CAD, sensorineural hearing loss, brings drinking, CVA in the past; was admitted on 10/25/2020 with right facial droop, right hemiparesis and a fall.  MRI/MRA brain showed acute left internal capsule infarct with minimal intensity and no LVO.  CTA head/neck showed acute infarct left internal capsule, patent right ICA stent, 60% stenosis proximal left left CCA, occlusion or high-grade stenosis proximal left-PCA, occlusion of proximal bilateral VA with reconstitution at C6 level.  2D echo showed EF of 60 to 65% with no wall abnormality and moderate layering plaque noted in the  transverse aorta.  Patient was loaded on Plavix and was started on DAPT.    She underwent cerebral angiogram on 12/17 revealing bilateral vertebral artery occlusion, 40 to 60% stenosis proximal left CCA, 50% stenosis.-SCA proximally and 75% stenosis left ICA proximally.  Carotid ultrasound done showing mild 1 to 39% left-ICA stenosis and left carotid stent not felt necessary per discussion with interventional radiology.  Stroke felt to be due to small vessel disease and she is to continue DAPT for secondary stroke prevention.  Patient transferred to CIR on 11/01/2020 .   Patient currently requires min with mobility secondary to muscle weakness, decreased cardiorespiratoy endurance, decreased coordination and decreased sitting balance, decreased standing balance, decreased postural control, hemiplegia and decreased balance strategies.  Prior to hospitalization, patient was independent  with mobility and lived with Other (Comment) (roomate, plans to dc to her mothers house) in a House home.  Home access is 4Stairs to enter.  Patient will benefit from skilled PT intervention to maximize safe functional mobility, minimize fall risk and decrease caregiver burden for planned discharge home with 24 hour supervision.  Anticipate patient will benefit from follow up [place] at discharge.  PT - End of Session Endurance Deficit: Yes Endurance Deficit Description: rest breaks within BADL tasks   PT Evaluation Precautions/Restrictions Precautions Precautions: Fall Precaution Comments: R hemiparesis RUE>LE Restrictions Weight Bearing Restrictions: No General   Vital SignsTherapy Vitals Temp: 98 F (36.7 C) Pulse Rate: 67 Resp: 16 BP: (!) 158/61 Patient Position (if appropriate): Sitting Oxygen Therapy SpO2: 100 % O2 Device: Room Air Home  Living/Prior Functioning Home Living Available Help at Discharge: Family;Available PRN/intermittently Type of Home: House Home Access: Stairs to enter [employer] of Steps: 4 Entrance Stairs-Rails: Left Home Layout: One level Bathroom Shower/Tub: Multimedia programmer: Standard Additional Comments: Mothers house described above; mother has a walk-in shower and already has a seat in shower  Lives With: Other (Comment) (roomate, plans to dc to her mothers house) Prior Function Level of Independence: Independent with basic ADLs;Independent with homemaking with ambulation  Able to Take Stairs?: Yes Driving: Yes Comments: does everything for self, does not drive but has mother/daughter to take her places.  Has 4 cats Vision/Perception  Perception Perception: Within Functional Limits  Cognition Overall Cognitive Status: Within Functional Limits for tasks assessed Arousal/Alertness: Awake/alert Memory: Appears intact Immediate Memory Recall: Bed;Blue;Sock Memory Recall Sock: Without Cue Memory Recall Blue: Without Cue Memory Recall Bed: Without Cue Awareness: Appears intact Problem Solving: Appears intact Safety/Judgment: Appears intact Sensation Sensation Light Touch: Appears Intact Coordination Gross Motor Movements are Fluid and Coordinated: No Fine Motor Movements are Fluid and Coordinated: No Coordination and Movement Description: decreaed smoothness and accuracy with R hand Heel Shin Test: decreased smoothness and accuracy of R leg Motor  Motor Motor: Hemiplegia;Ataxia Motor - Skilled Clinical Observations: R hemiplegia   Trunk/Postural Assessment  Cervical Assessment Cervical Assessment: Exceptions to [facility] (forward head) Thoracic Assessment Thoracic Assessment: Exceptions to [facility] (rounded shoulders) Lumbar Assessment Lumbar Assessment: Exceptions to [facility] (posterior pelvic tilt) Postural Control Postural Control: Deficits on evaluation Protective Responses: Slightly Delayed  Balance Balance Balance Assessed: Yes Static Sitting Balance Static Sitting - Level of Assistance: 5: Stand by assistance Dynamic  Sitting Balance Dynamic Sitting - Level of Assistance: 5: Stand by assistance Static Standing Balance Static Standing - Balance Support: During functional activity Static Standing - Level of Assistance: 4: Min assist Dynamic Standing Balance Dynamic Standing - Balance Support: During functional activity Dynamic Standing - Level of Assistance: 4: Min assist Extremity Assessment  RUE Assessment RUE Assessment: Exceptions to [facility] RUE Body System: Neuro Brunstrum levels for arm and hand: Arm;Hand Brunstrum level for arm: Stage IV Movement is deviating from synergy Brunstrum level for hand: Stage VI Isolated joint movements RUE Strength RUE Overall Strength Comments: Overall 3- to 3+ Right Shoulder Flexion: 3-/5 LUE Assessment LUE Assessment: Within Functional Limits      Care Tool Care Tool Bed Mobility Roll left and right activity   Roll left and right assist level: Supervision/Verbal cueing    Sit to lying activity   Sit to lying assist level: Supervision/Verbal cueing    Lying to sitting edge of bed activity   Lying to sitting edge of bed assist level: Supervision/Verbal cueing     Care Tool Transfers Sit to stand transfer   Sit to stand assist level: Minimal Assistance - Patient > 75%    Chair/bed transfer   Chair/bed transfer assist level: Minimal Assistance - Patient > 75%     Toilet transfer   Assist Level: Minimal Assistance - Patient > 75%    Car transfer   Car transfer assist level: Minimal Assistance - Patient > 75%      Care Tool Locomotion Ambulation   Assist level: Moderate Assistance - Patient 50 - 74% Assistive device: No Device Max distance: 150'  Walk 10 feet activity   Assist level: Moderate Assistance - Patient - 50 - 74% Assistive device: No Device   Walk 50 feet with 2 turns activity   Assist level: Moderate Assistance - Patient -  50 - 74% Assistive device: No Device  Walk 150 feet activity   Assist level: Moderate Assistance - Patient -  50 - 74% Assistive device: Walker-rolling  Walk 10 feet on uneven surfaces activity Walk 10 feet on uneven surfaces activity did not occur: Safety/medical concerns      Stairs   Assist level: Minimal Assistance - Patient > 75% Stairs assistive device: 2 hand rails Max number of stairs: 8  Walk up/down 1 step activity   Walk up/down 1 step (curb) assist level: Minimal Assistance - Patient > 75% Walk up/down 1 step or curb assistive device: 2 hand rails    Walk up/down 4 steps activity Walk up/down 4 steps assist level: Minimal Assistance - Patient > 75% Walk up/down 4 steps assistive device: 2 hand rails  Walk up/down 12 steps activity Walk up/down 12 steps activity did not occur: Safety/medical concerns      Pick up small objects from floor Pick up small object from the floor (from standing position) activity did not occur: Safety/medical concerns      Wheelchair Will patient use wheelchair at discharge?: No          Wheel 50 feet with 2 turns activity      Wheel 150 feet activity        Refer to Care Plan for Long Term Goals  SHORT TERM GOAL WEEK 1 PT Short Term Goal 1 (Week 1): Pt will perform bed to chair with CGA. PT Short Term Goal 2 (Week 1): Pt will ambulate 150' with minA and no AD PT Short Term Goal 3 (Week 1): Pt will perform 4 steps with LHR and CGA  Recommendations for other services: None   Skilled Therapeutic Intervention  Evaluation completed (see details above and below) with education on PT POC and goals and individual treatment initiated with focus on bed mobility, balance, transfers, ambulation, and stairs.  Pt received seated in WC and agrees to therapy. No complaint of pain. WC transport to gym for time management. Pt performs car transfer with minA and no AD.PT blocking R knee and provides verbal and tactile cues on sequencing. Stand pivot to mat table with minA. Pt performs sit<>supine with verbal cues for sequencing and positioning. Pt performs  multiple sit to stand transfers during session with minA and cues for body mechanics with PT blocking R knee. Pt ambulates 150' with no AD. PT provides modA at hips with noted ataxic gait on R side and occasional LOB. No overt knee buckling noted. Pt completes x8 6" steps with Bilateral hand rails and minA, with cues for sequencing. Pt then performs NMR for R leg, performing repeated toe taps on step with L leg, using R hand for support on rail and PT facilitating R lateral weight shift, x25 reps total. Stand pivot transfer back to bed with minA. Pt left supine with alarm intact and all needs within reach.   Mobility Bed Mobility Bed Mobility: Sit to Supine;Supine to Sit Supine to Sit: Supervision/Verbal cueing Sit to Supine: Supervision/Verbal cueing Transfers Transfers: Sit to Stand;Stand to Sit;Stand Pivot Transfers Sit to Stand: Minimal Assistance - Patient > 75% Stand to Sit: Minimal Assistance - Patient > 75% Stand Pivot Transfers: Minimal Assistance - Patient > 75% Stand Pivot Transfer Details: Verbal cues for technique;Tactile cues for placement;Tactile cues for weight shifting;Verbal cues for sequencing Transfer (Assistive device): None Locomotion  Gait Ambulation: Yes Gait Assistance: Moderate Assistance - Patient 50-74% Gait Distance (Feet): 150 Feet Assistive device: None Gait Assistance  Details: Verbal cues for technique;Verbal cues for gait pattern;Verbal cues for sequencing;Tactile cues for weight beaing;Tactile cues for posture;Tactile cues for weight shifting Gait Gait: Yes Gait Pattern: Impaired Gait Pattern: Decreased step length - left;Decreased stance time - left;Decreased hip/knee flexion - right;Decreased dorsiflexion - right;Decreased weight shift to right Gait velocity: decreased Stairs / Additional Locomotion Stairs: Yes Stairs Assistance: Minimal Assistance - Patient > 75% Stair Management Technique: Two rails Number of Stairs: 8 Height of Stairs: 6 Curb:  Minimal Assistance - Patient >75% Wheelchair Mobility Wheelchair Mobility: No   Discharge Criteria: Patient will be discharged from PT if patient refuses treatment 3 consecutive times without medical reason, if treatment goals not met, if there is a change in medical status, if patient makes no progress towards goals or if patient is discharged from hospital.  The above assessment, treatment plan, treatment alternatives and goals were discussed and mutually agreed upon: by patient  Breck Coons, PT, DPT 11/02/2020, 4:02 PM

## 2020-11-02 NOTE — Progress Notes (Signed)
[facility] PHYSICAL MEDICINE & REHABILITATION PROGRESS NOTE   Subjective/Complaints:  Pt reports doing well-  Wants IV out- no pain- slept OK-  LBM yesterday- ate >75% OF BREAKFAST.     ROS:  Pt denies SOB, abd pain, CP, N/V/C/D, and vision changes   Objective:   No results found. Recent Labs    10/31/20 0502 11/02/20 0417  WBC 7.9 6.4  HGB 12.4 12.0  HCT 34.4* 35.1*  PLT 278 268   Recent Labs    10/31/20 0157 11/02/20 0417  NA 134* 136  K 4.0 3.7  CL 96* 101  CO2 26 26  GLUCOSE 99 108*  BUN 13 18  CREATININE 0.97 0.98  CALCIUM 9.9 9.4    Intake/Output Summary (Last 24 hours) at 11/02/2020 4580 Last data filed at 11/02/2020 0700 Gross per 24 hour  Intake 420 ml  Output --  Net 420 ml        Physical Exam: Vital Signs Blood pressure (!) 98/58, pulse 71, temperature 97.9 F (36.6 C), temperature source Oral, resp. rate 16, height 5\' 3"  (1.6 m), SpO2 98 %.  Physical Exam General: Alert, No apparent distress- laying in bed- appropriate, NAD HEENT: R facial droop notable Heart: RRR Chest: CTA B/L- no W/R/R- good air movement Abdomen: Soft, NT, ND, (+)BS  Extremities: No clubbing, cyanosis, or edema. Pulses are 2+ Skin: Multiple healed scars on bilateral forearms.   Neuro: Pt is cognitively appropriate with normal insight, memory, and awareness. Right facial weakness with mild dysarthria. Right sided weakness. Able to follow simple motor commands without difficulty.  3/5 strength throughout right side with the exception of 0/5 DF/PF Musculoskeletal: Full ROM, No pain with AROM or PROM in the neck, trunk, or extremities. Posture appropriate Psych: Pt's affect is appropriate. Pt is cooperative        Assessment/Plan: 1. Functional deficits which require 3+ hours per day of interdisciplinary therapy in a comprehensive inpatient rehab setting.  Physiatrist is providing close team supervision and 24 hour management of active medical problems listed  below.  Physiatrist and rehab team continue to assess barriers to discharge/monitor patient progress toward functional and medical goals  Care Tool:  Bathing              Bathing assist       Upper Body Dressing/Undressing Upper body dressing   What is the patient wearing?: Hospital gown only    Upper body assist Assist Level: Minimal Assistance - Patient > 75%    Lower Body Dressing/Undressing Lower body dressing            Lower body assist       Toileting Toileting    Toileting assist Assist for toileting: Moderate Assistance - Patient 50 - 74%     Transfers Chair/bed transfer  Transfers assist           Locomotion Ambulation   Ambulation assist              Walk 10 feet activity   Assist           Walk 50 feet activity   Assist           Walk 150 feet activity   Assist           Walk 10 feet on uneven surface  activity   Assist           Wheelchair     Assist  Wheelchair 50 feet with 2 turns activity    Assist            Wheelchair 150 feet activity     Assist          Blood pressure (!) 98/58, pulse 71, temperature 97.9 F (36.6 C), temperature source Oral, resp. rate 16, height 5\' 3"  (1.6 m), SpO2 98 %.  Medical Problem List and Plan: 1.  Impaired mobility and ADLs secondary to left lacunar infarct             -patient may shower             -ELOS/Goals: 16-20 days S-MinA 2.  Antithrombotics: -DVT/anticoagulation:  Pharmaceutical: Lovenox             -antiplatelet therapy:DAPT 3. Pain Management: Pain is well controlled- continue Tylenol prn   12/23- pain controlled- con't meds 4. Mood: LCSW to follow for evaluation and support.              -antipsychotic agents: N/A 5. Neuropsych: This patient is capable of making decisions on herr own behalf. 6. Skin/Wound Care: Routine pressure relief measures.  7. Fluids/Electrolytes/Nutrition: Monitor I/O.  Hyponatremic to 134 on 12/21, repeat tomorrow.   8. HTN: BP labile- continue to monitor TID  12/23- Is soft this AM 90s/50s- asymptomatic- will monitor 9. Anxiety d/o: Continue buspar tid 10 H/o depression: On Zoloft with trazodone to help manage insomnia.  11. Dyslipidemia: Continue high dose atorvastatin.     LOS: 1 days A FACE TO FACE EVALUATION WAS PERFORMED  Rebecca Sutton 11/02/2020, 9:27 AM

## 2020-11-02 NOTE — Evaluation (Signed)
Occupational Therapy Assessment and Plan  Patient Details  Name: Rebecca Sutton MRN: [id number] Date of Birth: [birth year]  OT Diagnosis: ataxia and muscle weakness (generalized) Rehab Potential: Rehab Potential (ACUTE ONLY): Excellent ELOS: 10-12 days   Today's Date: 11/02/2020 OT Individual Time: 1100-1200 OT Individual Time Calculation (min): 60 min     Hospital Problem: Principal Problem:   Left sided lacunar infarction [facility]) Active Problems:   Sensorineural hearing loss (SNHL)   Coronary artery disease involving native coronary artery of native heart without angina pectoris   Alcohol consumption binge drinking   History of CVA (cerebrovascular accident)   Right hemiparesis (HCC)   Hyperglycemia   Stroke [facility])   Past Medical History:  Past Medical History:  Diagnosis Date  . CAD (coronary artery disease)   . Carotid stenosis    bilateral  . CVA (cerebral vascular accident) (HCC) 2019  . H/O ETOH abuse 08/2020  . Smoking trying to quit    1/2 PPD  . Substance abuse (HCC)    last used 20 years ago   Past Surgical History:  Past Surgical History:  Procedure Laterality Date  . IR ANGIO INTRA EXTRACRAN SEL COM CAROTID INNOMINATE BILAT MOD SED  10/27/2020  . IR ANGIO VERTEBRAL SEL SUBCLAVIAN INNOMINATE BILAT MOD SED  10/27/2020    Assessment & Plan Clinical Impression: Patient is a 59 y.o. year old female with recent admission to the hospital on 12/15 with R weakness, facial droop, and fall. 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 Patient transferred to CIR on 11/01/2020 .    Patient currently requires min with basic self-care skills secondary to muscle weakness, decreased cardiorespiratoy endurance, impaired timing and sequencing, abnormal tone,  unbalanced muscle activation, ataxia and decreased coordination and decreased sitting balance, decreased standing balance, decreased postural control, hemiplegia and decreased balance strategies.  Prior to hospitalization, patient could complete BADL with independent .  Patient will benefit from skilled intervention to increase independence with basic self-care skills prior to discharge home with care partner.  Anticipate patient will require 24 hour supervision and follow up outpatient.  OT - End of Session Endurance Deficit: Yes Endurance Deficit Description: rest breaks within BADL tasks OT Assessment Rehab Potential (ACUTE ONLY): Excellent OT Patient demonstrates impairments in the following area(s): Balance;Endurance;Motor;Perception;Pain;Sensory OT Basic ADL's Functional Problem(s): Eating;Grooming;Bathing;Toileting;Dressing OT Transfers Functional Problem(s): Toilet;Tub/Shower OT Additional Impairment(s): Fuctional Use of Upper Extremity OT Plan OT Intensity: Minimum of 1-2 x/day, 45 to 90 minutes OT Frequency: 5 out of 7 days OT Duration/Estimated Length of Stay: 10-12 days OT Treatment/Interventions: Metallurgist training;Community reintegration;Discharge planning;Disease mangement/prevention;DME/adaptive equipment instruction;Functional electrical stimulation;Functional mobility training;Neuromuscular re-education;Pain management;Patient/family education;Self Care/advanced ADL retraining;Psychosocial support;Wheelchair propulsion/positioning;Visual/perceptual remediation/compensation;UE/LE Coordination activities;UE/LE Strength taining/ROM;Therapeutic Activities;Splinting/orthotics;Skin care/wound managment;Therapeutic Exercise OT Self Feeding Anticipated Outcome(s): mod I OT Basic Self-Care Anticipated Outcome(s): mod I/supervision OT Toileting Anticipated Outcome(s): supervision OT Bathroom Transfers Anticipated Outcome(s): supervision OT Recommendation Patient destination:  Home Follow Up Recommendations: Outpatient OT Equipment Recommended: To be determined   OT Evaluation Precautions/Restrictions  Precautions Precautions: Fall Precaution Comments: R hemiparesis RUE>LE Restrictions Weight Bearing Restrictions: No Pain Pain Assessment Pain Scale: 0-10 Pain Score: 0-No pain Home Living/Prior Functioning Home Living Family/patient expects to be discharged to:: Private residence Living Arrangements: Children,Parent Available Help at Discharge: Family,Available PRN/intermittently Type of Home: House Home Access: Stairs to enter [employer]  of Steps: 4 Entrance Stairs-Rails: Left Home Layout: One level Bathroom Shower/Tub: Multimedia programmer: Standard Additional Comments: Mothers house described above; mother has a walk-in shower and already has a seat in shower  Lives With: Other (Comment) (roomate, plans to dc to her mothers house) IADL History Homemaking Responsibilities: Yes Prior Function Level of Independence: Independent with basic ADLs,Independent with homemaking with ambulation  Able to Take Stairs?: Yes Driving: Yes Comments: does everything for self, does not drive but has mother/daughter to take her places.  Has 4 cats Vision Baseline Vision/History: No visual deficits Patient Visual Report: No change from baseline Perception  Perception: Within Functional Limits Cognition Overall Cognitive Status: Within Functional Limits for tasks assessed Arousal/Alertness: Awake/alert Orientation Level: Place;Situation;Person Person: Oriented Place: Oriented Situation: Oriented Year: 2021 Month: December Day of Week: Correct Memory: Appears intact Immediate Memory Recall: Bed;Blue;Sock Memory Recall Sock: Without Cue Memory Recall Blue: Without Cue Memory Recall Bed: Without Cue Awareness: Appears intact Problem Solving: Appears intact Safety/Judgment: Appears intact Sensation Sensation Light Touch: Appears  Intact Coordination Gross Motor Movements are Fluid and Coordinated: No Fine Motor Movements are Fluid and Coordinated: No Coordination and Movement Description: decreaed smoothness and accuracy with R hand Motor  Motor Motor: Hemiplegia;Ataxia Motor - Skilled Clinical Observations: R hemiplegia  Balance Balance Balance Assessed: Yes Static Sitting Balance Static Sitting - Level of Assistance: 5: Stand by assistance Dynamic Sitting Balance Dynamic Sitting - Level of Assistance: 5: Stand by assistance Static Standing Balance Static Standing - Balance Support: During functional activity Static Standing - Level of Assistance: 4: Min assist Dynamic Standing Balance Dynamic Standing - Balance Support: During functional activity Dynamic Standing - Level of Assistance: 4: Min assist Extremity/Trunk Assessment RUE Assessment RUE Assessment: Exceptions to [facility] RUE Body System: Neuro Brunstrum levels for arm and hand: Arm;Hand Brunstrum level for arm: Stage IV Movement is deviating from synergy Brunstrum level for hand: Stage VI Isolated joint movements RUE Strength RUE Overall Strength Comments: Overall 3- to 3+ Right Shoulder Flexion: 3-/5 LUE Assessment LUE Assessment: Within Functional Limits  Care Tool Care Tool Self Care Eating   Eating Assist Level: Set up assist    Oral Care    Oral Care Assist Level: Contact Guard/Toucning assist    Bathing   Body parts bathed by patient: Right arm;Left arm;Chest;Front perineal area;Abdomen;Buttocks;Right upper leg;Left lower leg;Face;Left upper leg;Right lower leg     Assist Level: Minimal Assistance - Patient > 75%    Upper Body Dressing(including orthotics)   What is the patient wearing?: Pull over shirt   Assist Level: Minimal Assistance - Patient > 75%    Lower Body Dressing (excluding footwear)   What is the patient wearing?: Underwear/pull up;Pants Assist for lower body dressing: Minimal Assistance - Patient > 75%     Putting on/Taking off footwear   What is the patient wearing?: Non-skid slipper socks Assist for footwear: Minimal Assistance - Patient > 75%       Care Tool Toileting Toileting activity   Assist for toileting: Minimal Assistance - Patient > 75%     Care Tool Bed Mobility Roll left and right activity        Sit to lying activity   Sit to lying assist level: Minimal Assistance - Patient > 75%    Lying to sitting edge of bed activity         Care Tool Transfers Sit to stand transfer   Sit to stand assist level: Minimal Assistance - Patient > 75%  Chair/bed transfer   Chair/bed transfer assist level: Minimal Assistance - Patient > 75%     Toilet transfer   Assist Level: Minimal Assistance - Patient > 75%     Care Tool Cognition Expression of Ideas and Wants Expression of Ideas and Wants: Some difficulty - exhibits some difficulty with expressing needs and ideas (e.g, some words or finishing thoughts) or speech is not clear   Understanding Verbal and Non-Verbal Content Understanding Verbal and Non-Verbal Content: Understands (complex and basic) - clear comprehension without cues or repetitions   Memory/Recall Ability *first 3 days only Memory/Recall Ability *first 3 days only: Current season;Staff names and faces;That he or she is in a hospital/hospital unit    Refer to Care Plan for [place] 1 OT Short Term Goal 1 (Week 1): Patient will ambulate into bathroom with CGA and LRAD OT Short Term Goal 2 (Week 1): Pt will maintain standing balance with CGA OT Short Term Goal 3 (Week 1): Patient will use R UE to don deodorant  Recommendations for other services: None    Skilled Therapeutic Intervention Initial eval completed with treatment provided to address functional transfers, functional use of R UE, improved sit<>stand, standing tolerance, and adapted bathing/dressing skills. Pt came to sitting EOB with min A. Stand-pivot transfers  bed>wc>shower chair with overall Min A. Incorporated R NMR weight bearing techniques within bathing tasks. Min A for balance when reaching to wash buttocks. LB/UB dressing completed wc level at the sink with assistance to thread R LE into clothing and assist to pull up pants. UB dressing with min A overall 2/2 R hemiplegia. Incorporate weight bearing through R UE when standing at the sink. Pt left seated in wc at end of session with chair alarm on, call bell in reach, and son present.   ADL ADL Eating: Set up Grooming: Minimal assistance Upper Body Bathing: Minimal assistance Lower Body Bathing: Minimal assistance Upper Body Dressing: Minimal assistance Lower Body Dressing: Minimal assistance Toileting: Minimal assistance Toilet Transfer: Minimal assistance Tub/Shower Transfer: Minimal assistance Mobility  Transfers Sit to Stand: Minimal Assistance - Patient > 75% Stand to Sit: Minimal Assistance - Patient > 75%   Discharge Criteria: Patient will be discharged from OT if patient refuses treatment 3 consecutive times without medical reason, if treatment goals not met, if there is a change in medical status, if patient makes no progress towards goals or if patient is discharged from hospital.  The above assessment, treatment plan, treatment alternatives and goals were discussed and mutually agreed upon: by patient  Valma Cava 11/02/2020, 12:58 PM

## 2020-11-02 NOTE — Progress Notes (Signed)
Inpatient Rehabilitation  Patient information reviewed and entered into eRehab system by Jawad Wiacek M. Margarie Mcguirt, M.A., CCC/SLP, PPS Coordinator.  Information including medical coding, functional ability and quality indicators will be reviewed and updated through discharge.    

## 2020-11-03 ENCOUNTER — Inpatient Hospital Stay (HOSPITAL_COMMUNITY): Payer: Medicaid Other | Admitting: Speech Pathology

## 2020-11-03 ENCOUNTER — Inpatient Hospital Stay (HOSPITAL_COMMUNITY): Payer: Medicaid Other | Admitting: Occupational Therapy

## 2020-11-03 ENCOUNTER — Inpatient Hospital Stay (HOSPITAL_COMMUNITY): Payer: Medicaid Other

## 2020-11-03 NOTE — Progress Notes (Signed)
Speech Language Pathology Daily Session Note  Patient Details  Name: Rebecca Sutton MRN: [id number] Date of Birth: 1961-10-01  Today's Date: 11/03/2020 SLP Individual Time: 1100-1155 SLP Individual Time Calculation (min): 55 min  Short Term Goals: Week 1: SLP Short Term Goal 1 (Week 1): Pt will use slow rate, inreased vocal intensity, and overarticulation to achieve intelligibility at the conversational level with supervision. SLP Short Term Goal 2 (Week 1): Pt will consume regular textures and thin liquids with mod I use of swallowing precautions and minimal overt s/s of aspiration.  Skilled Therapeutic Interventions: Pt was seen for skilled ST targeting dysphagia and speech goals. SLP facilitated session with a regular texture snack targeting textures pt reported she was initially apprehensive to try due to sensation of xerostomia and dry throat that made it difficult to swallow breads and meats. Pt was Mod I for recall and implementation of compensatory safe swallow strategies and denied difficulty with intake of graham cracker and peanut butter, as well as with meats consumed during meals yesterday and this AM. SLP will continue to monitor tolerance of regular textures, briefly, however pt is making quick progress toward meeting swallowing goals. Pt also independently recalled compensatory strategies for dysarthria, and utilized slow rate and overarticulation with no more than Min A cues from clinician. She reports she feels her speech is getting better each day but had questions about impact of fatigue on speech, which SLP provided skilled education about. Pt was 95% intelligible at conversation level throughout session. Pt left sitting in chair with alarm set and needs within reach. Continue per current plan of care.          Pain Pain Assessment Pain Scale: 0-10 Pain Score: 0-No pain  Therapy/Group: Individual Therapy  Little Ishikawa 11/03/2020, 7:25 AM

## 2020-11-03 NOTE — Progress Notes (Signed)
Inpatient Rehabilitation Care Coordinator Assessment and Plan Patient Details  Name: Rebecca Sutton MRN: [id number] Date of Birth: Oct 19, 1961  Today's Date: 11/03/2020  Hospital Problems: Principal Problem:   Left sided lacunar infarction [facility]) Active Problems:   Sensorineural hearing loss (SNHL)   Coronary artery disease involving native coronary artery of native heart without angina pectoris   Alcohol consumption binge drinking   History of CVA (cerebrovascular accident)   Right hemiparesis ([place])   Hyperglycemia   Stroke [facility])  Past Medical History:  Past Medical History:  Diagnosis Date  . CAD (coronary artery disease)   . Carotid stenosis    bilateral  . CVA (cerebral vascular accident) ([place]) 2019  . H/O ETOH abuse 08/2020  . Smoking trying to quit    1/2 PPD  . Substance abuse ([place])    last used 20 years ago   Past Surgical History:  Past Surgical History:  Procedure Laterality Date  . IR ANGIO INTRA EXTRACRAN SEL COM CAROTID INNOMINATE BILAT MOD SED  10/27/2020  . IR ANGIO VERTEBRAL SEL SUBCLAVIAN INNOMINATE BILAT MOD SED  10/27/2020   Social History:  reports that she has been smoking cigarettes. She has been smoking about 0.50 packs per day. She has never used smokeless tobacco. She reports previous alcohol use. She reports previous drug use.  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 Read: Yes Write: Yes Employment Status: Environmental education officer Issues: Denies Guardian/Conservator: N/A   Abuse/Neglect Abuse/Neglect Assessment  Can Be Completed: Yes Physical Abuse: Denies Verbal Abuse: Denies Sexual Abuse: Denies Exploitation of patient/patient's resources: Denies Self-Neglect: Denies  Emotional Status Pt's affect, behavior and adjustment status: Pt in good spirits at time of visit Recent Psychosocial Issues: Goes to [facility] in [place] for Depression; medication management only Psychiatric History: Treated for depression Substance Abuse History: Denies; admits that she smokes cigarettes 2.5-3pks per week. Does not think she will continue at discharge.; quit EtoH last year 09/05/20 after brother passing.   Patient / Family Perceptions, Expectations & Goals Pt/Family understanding of illness & functional limitations: Pt has general understanding of care needs Premorbid pt/family roles/activities: Independent Anticipated changes in roles/activities/participation: Assistance with ADLs/IADLs Pt/family expectations/goals: Pt goal is to work on walking and gettin right side back  [employer]: None Premorbid Home Care/DME Agencies: None Transportation available at discharge: family Resource referrals recommended: Neuropsychology  Discharge Planning Living Arrangements: Alone Support Systems: Children,Parent Type of Residence: Private residence Insurance Resources: Teacher, adult education Resources: Family Support Financial Screen Referred: No Living Expenses: Rent Money Management: Patient Does the patient have any problems obtaining your medications?: No Care Coordinator Barriers to Discharge: Decreased caregiver support,Lack of/limited family support,Other (comments) Care Coordinator Barriers to Discharge Comments: Pt is uninsured Care Coordinator Anticipated Follow Up Needs: HH/OP Expected length of stay: 10-12 days.  Clinical Impression SW met with pt in room to introduce self, explain role, and discuss discharge process. No HCPOA- would like information only. If she had to choose  anyone, it would be her dtr Rebecca Sutton. Pt is not a English as a second language teacher. DME: access to a RW, 3in1 BSC, ? W/c, and shower chair.   SW informed pt assigned RN on request for [facility] consult.  SW left message for [name] at 1:41pm. SW informed there will be follow-up after team conference.   Rana Snare 11/03/2020, 9:23 PM

## 2020-11-03 NOTE — IPOC Note (Signed)
Overall Plan of Care [facility]) Patient Details Name: Rebecca Sutton MRN: [id number] DOB: March 01, 1961  Admitting Diagnosis: Left sided lacunar infarction [facility])  Hospital Problems: Principal Problem:   Left sided lacunar infarction [facility]) Active Problems:   Sensorineural hearing loss (SNHL)   Coronary artery disease involving native coronary artery of native heart without angina pectoris   Alcohol consumption binge drinking   History of CVA (cerebrovascular accident)   Right hemiparesis (HCC)   Hyperglycemia   Stroke [facility])     Functional Problem List: Nursing Bladder,Bowel,Endurance,Medication Management,Nutrition,Pain,Perception,Safety,Skin Integrity  PT Balance,Endurance,Motor,Pain,Safety  OT Balance,Endurance,Motor,Perception,Pain,Sensory  SLP Nutrition,Linguistic  TR         Basic ADL's: OT Eating,Grooming,Bathing,Toileting,Dressing     Advanced  ADL's: OT       Transfers: PT Bed Mobility,Bed to Chair,Car,Furniture  OT Toilet,Tub/Shower     Locomotion: PT Ambulation,Stairs     Additional Impairments: OT Fuctional Use of Upper Extremity  SLP Communication,Swallowing expression    TR      Anticipated Outcomes Item Anticipated Outcome  Self Feeding mod I  Swallowing  mod I   Basic self-care  mod I/supervision  Toileting  supervision   Bathroom Transfers supervision  Bowel/Bladder  supervision to min assist  Transfers  Supervision  Locomotion  Supervision  Communication  mod I  Cognition     Pain  <3  Safety/Judgment  supervision to min assist   Therapy Plan: PT Intensity: Minimum of 1-2 x/day ,45 to 90 minutes PT Frequency: 5 out of 7 days PT Duration Estimated Length of Stay: 10-12 days OT Intensity: Minimum of 1-2 x/day, 45 to 90 minutes OT Frequency: 5 out of 7 days OT Duration/Estimated Length of Stay: 10-12 days SLP Intensity: Minumum of 1-2 x/day, 30 to 90 minutes SLP Frequency: 1 to 3 out of 7 days SLP Duration/Estimated Length of  Stay: 16-20 days   Due to the current state of emergency, patients may not be receiving their 3-hours of Medicare-mandated therapy.   Team Interventions: Nursing Interventions Patient/Family Education,Bladder Management,Bowel Management,Disease Management/Prevention,Pain Management,Medication Management,Skin Care/Wound Management,Discharge Planning,Psychosocial Support  PT interventions Ambulation/gait training,Discharge planning,Cognitive remediation/compensation,DME/adaptive equipment instruction,Functional mobility training,Pain management,Psychosocial support,Splinting/orthotics,Therapeutic Activities,UE/LE Strength taining/ROM,Balance/vestibular training,Community Chief Financial Officer stimulation,Patient/family education,Stair training,Therapeutic Exercise,UE/LE Coordination activities  OT Interventions Balance/vestibular training,Community reintegration,Discharge planning,Disease Psychologist, prison and probation services stimulation,Functional mobility training,Neuromuscular re-education,Pain management,Patient/family education,Self Care/advanced ADL retraining,Psychosocial support,Wheelchair propulsion/positioning,Visual/perceptual remediation/compensation,UE/LE Coordination activities,UE/LE Strength taining/ROM,Therapeutic Activities,Splinting/orthotics,Skin care/wound managment,Therapeutic Exercise  SLP Interventions Cueing hierarchy,Dysphagia/aspiration precaution training,Functional tasks,Patient/family education,Speech/Language facilitation,Internal/external aids,Environmental controls  TR Interventions    SW/CM Interventions Discharge Planning,Psychosocial Support,Patient/Family Education   Barriers to Discharge MD  Medical stability, Home enviroment access/loayout, Lack of/limited family support, Weight, Weight bearing restrictions and speech issues  Nursing Inaccessible home  environment,Decreased caregiver support,Home environment access/layout,Incontinence,Wound Care,Lack of/limited family support,Medication compliance,Behavior,Nutrition means    PT Home environment Best boy    OT      SLP      SW       Team Discharge Planning: Destination: PT-Home ,OT- Home , SLP-Home Projected Follow-up: PT-Home health PT,24 hour supervision/assistance, OT-  Outpatient OT, SLP-Other (comment) (TBD  pending progress made while inpatient) Projected Equipment Needs: PT-To be determined, OT- To be determined, SLP-None recommended by SLP Equipment Details: PT- , OT-  Patient/family involved in discharge planning: PT- Patient,  OT-Patient, SLP-Patient,Family member/caregiver  MD ELOS: 12-16 days Medical Rehab Prognosis:  Good Assessment: Pt is a 59 yr old female with L lacunar infarct with dysphagia/aphasia, and R sided weakness She's had labile BP- and depression.  Goals Supervision to mod I    See Team Conference Notes for weekly updates to the plan of care

## 2020-11-03 NOTE — Progress Notes (Signed)
Occupational Therapy Session Note  Patient Details  Name: Rebecca Sutton MRN: [id number] Date of Birth: 1960/12/11  Today's Date: 11/03/2020 OT Individual Time: 1000-1100 OT Individual Time Calculation (min): 60 min    Short Term Goals: Week 1:  OT Short Term Goal 1 (Week 1): Patient will ambulate into bathroom with CGA and LRAD OT Short Term Goal 2 (Week 1): Pt will maintain standing balance with CGA OT Short Term Goal 3 (Week 1): Patient will use R UE to don deodorant  Skilled Therapeutic Interventions/Progress Updates:    Patient greeted semi-reclined in bed asleep, easy to wake, and agreeable to OT treatment session. Pt reported need to go to the bathroom. Min A stand-pivot bed>wc>toilet. Min A for clothing management with continent void of bladder. Pt declined bathing but wanted to get dressed. Dressing completed with education provided for hemi techniques. Overall min A for balance when standing to pull up pants and verbal cues for technique. Worked on standing balance/endurance with standing grooming tasks for 4 minutes with CGA for balance. R fine motor control with opening and closing containers including screwing on/off toothpaste lid. Pt brought down to therapy gym an worked on R hand fine motor control with ornament making activity. Graded task from placing larger items into ornament ball, progressing to smaller ones. Peg board task completed with small pegs with focus on in hand translation, rotation, and functional pinch. Pt returned to room and left seated in wc with alarm pad on, call bell in reach, and needs met.   Therapy Documentation Precautions:  Precautions Precautions: Fall Precaution Comments: R hemiparesis RUE>LE Restrictions Weight Bearing Restrictions: No Pain: Pain Assessment Pain Scale: 0-10 Pain Score: 0-No pain   Therapy/Group: Individual Therapy  Valma Cava 11/03/2020, 10:25 AM

## 2020-11-03 NOTE — Care Management (Signed)
Inpatient Rehabilitation Center Individual Statement of Services  Patient Name:  JANISA LABUS  Date:  11/03/2020  Welcome to the Inpatient Rehabilitation Center.  Our goal is to provide you with an individualized program based on your diagnosis and situation, designed to meet your specific needs.  With this comprehensive rehabilitation program, you will be expected to participate in at least 3 hours of rehabilitation therapies Monday-Friday, with modified therapy programming on the weekends.  Your rehabilitation program will include the following services:  Physical Therapy (PT), Occupational Therapy (OT), Speech Therapy (ST), 24 hour per day rehabilitation nursing, Therapeutic Recreaction (TR), Psychology, Neuropsychology, Care Coordinator, Rehabilitation Medicine, Nutrition Services, Pharmacy Services and Other  Weekly team conferences will be held on Tuesdays to discuss your progress.  Your Inpatient Rehabilitation Care Coordinator will talk with you frequently to get your input and to update you on team discussions.  Team conferences with you and your family in attendance may also be held.  Expected length of stay: 10-12 days    Overall anticipated outcome: Supervision  Depending on your progress and recovery, your program may change. Your Inpatient Rehabilitation Care Coordinator will coordinate services and will keep you informed of any changes. Your Inpatient Rehabilitation Care Coordinator's name and contact numbers are listed  below.  The following services may also be recommended but are not provided by the Inpatient Rehabilitation Center:   Driving Evaluations  Home Health Rehabiltiation Services  Outpatient Rehabilitation Services  Vocational Rehabilitation   Arrangements will be made to provide these services after discharge if needed.  Arrangements include referral to agencies that provide these services.  Your insurance has been verified to be:  Family Planning  Medicaid  Your primary doctor is:  No PCP  Pertinent information will be shared with your doctor and your insurance company.  Inpatient Rehabilitation Care Coordinator:  Susie Cassette [phone] or ([phone]  Information discussed with and copy given to patient by: Gretchen Short, 11/03/2020, 10:04 AM

## 2020-11-03 NOTE — Progress Notes (Signed)
[place] PHYSICAL MEDICINE & REHABILITATION PROGRESS NOTE   Subjective/Complaints:  Pt reports doing well- I woke her up- she wants to go back to sleep.   LBM 2 days ago- denies constipation-  Per SW, wondering if pt will be disabled for >12 months- I explained due to her stroke, I think it's EXTREMELY likely.    ROS:  Pt denies SOB, abd pain, CP, N/V/C/D, and vision changes  Objective:   No results found. Recent Labs    11/02/20 0417  WBC 6.4  HGB 12.0  HCT 35.1*  PLT 268   Recent Labs    11/02/20 0417  NA 136  K 3.7  CL 101  CO2 26  GLUCOSE 108*  BUN 18  CREATININE 0.98  CALCIUM 9.4    Intake/Output Summary (Last 24 hours) at 11/03/2020 1030 Last data filed at 11/03/2020 0900 Gross per 24 hour  Intake 530 ml  Output --  Net 530 ml        Physical Exam: Vital Signs Blood pressure 107/62, pulse 74, temperature 98 F (36.7 C), resp. rate 20, height 5\' 3"  (1.6 m), SpO2 100 %.  Physical Exam General:  Asleep- woke to stimuli, NAD HEENT: R facial droop notable- no change Heart: RRR Chest: CTA B/L- no W/R/R- good air movement Abdomen: Soft, NT, ND, (+)BS  Extremities: No clubbing, cyanosis, or edema. Pulses are 2+ Skin: Multiple healed scars on bilateral forearms.   Neuro: Pt is cognitively appropriate with normal insight, memory, and awareness. Right facial weakness with mild dysarthria. Right sided weakness. Able to follow simple motor commands without difficulty.  3/5 strength throughout right side with the exception of 0/5 DF/PF Musculoskeletal: Full ROM, No pain with AROM or PROM in the neck, trunk, or extremities. Posture appropriate Psych: appropriate        Assessment/Plan: 1. Functional deficits which require 3+ hours per day of interdisciplinary therapy in a comprehensive inpatient rehab setting.  Physiatrist is providing close team supervision and 24 hour management of active medical problems listed below.  Physiatrist and rehab  team continue to assess barriers to discharge/monitor patient progress toward functional and medical goals  Care Tool:  Bathing    Body parts bathed by patient: Right arm,Left arm,Chest,Front perineal area,Abdomen,Buttocks,Right upper leg,Left lower leg,Face,Left upper leg,Right lower leg         Bathing assist Assist Level: Minimal Assistance - Patient > 75%     Upper Body Dressing/Undressing Upper body dressing   What is the patient wearing?: Pull over shirt    Upper body assist Assist Level: Minimal Assistance - Patient > 75%    Lower Body Dressing/Undressing Lower body dressing      What is the patient wearing?: Underwear/pull up     Lower body assist Assist for lower body dressing: Minimal Assistance - Patient > 75%     Toileting Toileting    Toileting assist Assist for toileting: Contact Guard/Touching assist     Transfers Chair/bed transfer  Transfers assist     Chair/bed transfer assist level: Minimal Assistance - Patient > 75%     Locomotion Ambulation   Ambulation assist      Assist level: Moderate Assistance - Patient 50 - 74% Assistive device: No Device Max distance: 150'   Walk 10 feet activity   Assist     Assist level: Moderate Assistance - Patient - 50 - 74% Assistive device: No Device   Walk 50 feet activity   Assist    Assist level: Moderate Assistance -  Patient - 50 - 74% Assistive device: No Device    Walk 150 feet activity   Assist    Assist level: Moderate Assistance - Patient - 50 - 74% Assistive device: Walker-rolling    Walk 10 feet on uneven surface  activity   Assist Walk 10 feet on uneven surfaces activity did not occur: Safety/medical concerns         Wheelchair     Assist Will patient use wheelchair at discharge?: No             Wheelchair 50 feet with 2 turns activity    Assist            Wheelchair 150 feet activity     Assist          Blood pressure 107/62,  pulse 74, temperature 98 F (36.7 C), resp. rate 20, height 5\' 3"  (1.6 m), SpO2 100 %.  Medical Problem List and Plan: 1.  Impaired mobility and ADLs secondary to left lacunar infarct             -patient may shower             -ELOS/Goals: 16-20 days S-MinA 2.  Antithrombotics: -DVT/anticoagulation:  Pharmaceutical: Lovenox             -antiplatelet therapy:DAPT 3. Pain Management: Pain is well controlled- continue Tylenol prn   12/23- pain controlled- con't meds 4. Mood: LCSW to follow for evaluation and support.              -antipsychotic agents: N/A 5. Neuropsych: This patient is capable of making decisions on herr own behalf. 6. Skin/Wound Care: Routine pressure relief measures.  7. Fluids/Electrolytes/Nutrition: Monitor I/O. Hyponatremic to 134 on 12/21, repeat tomorrow.   8. HTN: BP labile- continue to monitor TID  12/23- Is soft this AM 90s/50s- asymptomatic- will monitor  12/24- BP a little better- 107/62- con't regimen 9. Anxiety d/o: Continue buspar tid 10 H/o depression: On Zoloft with trazodone to help manage insomnia.  11. Dyslipidemia: Continue high dose atorvastatin.     Pt will require >12 months of disability, maybe longer due to her stroke.    LOS: 2 days A FACE TO FACE EVALUATION WAS PERFORMED  Boleslaus Holloway 11/03/2020, 10:30 AM

## 2020-11-03 NOTE — Progress Notes (Signed)
Physical Therapy Session Note  Patient Details  Name: Rebecca Sutton MRN: [id number] Date of Birth: 1961/03/03  Today's Date: 11/03/2020 PT Individual Time: 4854-6270 PT Individual Time Calculation (min): 70 min   Short Term Goals: Week 1:  PT Short Term Goal 1 (Week 1): Pt will perform bed to chair with CGA. PT Short Term Goal 2 (Week 1): Pt will ambulate 150' with minA and no AD PT Short Term Goal 3 (Week 1): Pt will perform 4 steps with LHR and CGA  Skilled Therapeutic Interventions/Progress Updates:    Patient received sitting up in wc, agreeable to PT. She denies pain. PT propelling patient in wc to therapy gym for time management and energy conservation. Patient requesting to begin with stretching/strengthening of R LE. PT performing prolonged R hamstring stretch in sitting, 3x45s. Patient completing the following therex seated: R ankle 4-ways x10 with theraband, R hamstring curls 2x10, R LAQ 2x10, hip abduction 2x10, STS x5 with U UE support on RW. Patient ambulating 59ft with RW and CGA. R steppage gait with foot flat contact noted, decreased gait speed, slight inversion of R ankle. She ambulated additional 150ft with RW and CGA/MinA with emphasis on R LE swing phase with full R knee extension to assist with achieving heel first contact. Patient ambulating additional 170ft with RW and CGA/MinA with 1.5# ankle weight to assist with improved proprioception and intentional placement of RLE throughout gait cycle. NMES to R anterior tibs performed to further assist into dorsiflexion x5 mins with good results. Patient returning to room, requesting to use bathroom. MinA stand pivot to toilet. Handoff to NT to continue care.   Therapy Documentation Precautions:  Precautions Precautions: Fall Precaution Comments: R hemiparesis RUE>LE Restrictions Weight Bearing Restrictions: No    Therapy/Group: Individual Therapy  Elizebeth Koller, PT, DPT, CBIS  11/03/2020, 7:41 AM

## 2020-11-04 NOTE — Progress Notes (Addendum)
[place] PHYSICAL MEDICINE & REHABILITATION PROGRESS NOTE   Subjective/Complaints:   No issues overnite  Denies bowel or bladder issues  ROS neg CP. SOB,N/V/D  ROS:  Pt denies SOB, abd pain, CP, N/V/C/D, and vision changes  Objective:   No results found. Recent Labs    11/02/20 0417  WBC 6.4  HGB 12.0  HCT 35.1*  PLT 268   Recent Labs    11/02/20 0417  NA 136  K 3.7  CL 101  CO2 26  GLUCOSE 108*  BUN 18  CREATININE 0.98  CALCIUM 9.4    Intake/Output Summary (Last 24 hours) at 11/04/2020 0850 Last data filed at 11/04/2020 0731 Gross per 24 hour  Intake 750 ml  Output --  Net 750 ml        Physical Exam: Vital Signs Blood pressure (!) 125/53, pulse 61, temperature 98.6 F (37 C), resp. rate 16, height 5\' 3"  (1.6 m), SpO2 99 %.  Physical Exam  General: No acute distress Mood and affect are appropriate Heart: Regular rate and rhythm no rubs murmurs or extra sounds Lungs: Clear to auscultation, breathing unlabored, no rales or wheezes Abdomen: Positive bowel sounds, soft nontender to palpation, nondistended Extremities: No clubbing, cyanosis, or edema Skin: No evidence of breakdown, no evidence of rash    Neuro: Pt is cognitively appropriate with normal insight, memory, and awareness. Right facial weakness with mild dysarthria. Right sided weakness. Able to follow simple motor commands without difficulty.  4- RIght delt bi, tri, grip, finger ext , 4- RIght HF, KE , 2- Right ankle DF, 3- R ankle PF Sensation intact Fine motor moderately reduce finger to thumb opposition  Musculoskeletal: Full ROM, No pain with AROM or PROM in the neck, trunk, or extremities. Posture appropriate Psych: appropriate        Assessment/Plan: 1. Functional deficits which require 3+ hours per day of interdisciplinary therapy in a comprehensive inpatient rehab setting.  Physiatrist is providing close team supervision and 24 hour management of active medical problems  listed below.  Physiatrist and rehab team continue to assess barriers to discharge/monitor patient progress toward functional and medical goals  Care Tool:  Bathing    Body parts bathed by patient: Right arm,Left arm,Chest,Front perineal area,Abdomen,Buttocks,Right upper leg,Left lower leg,Face,Left upper leg,Right lower leg         Bathing assist Assist Level: Minimal Assistance - Patient > 75%     Upper Body Dressing/Undressing Upper body dressing   What is the patient wearing?: Pull over shirt    Upper body assist Assist Level: Minimal Assistance - Patient > 75%    Lower Body Dressing/Undressing Lower body dressing      What is the patient wearing?: Underwear/pull up     Lower body assist Assist for lower body dressing: Minimal Assistance - Patient > 75%     Toileting Toileting    Toileting assist Assist for toileting: Supervision/Verbal cueing     Transfers Chair/bed transfer  Transfers assist     Chair/bed transfer assist level: Minimal Assistance - Patient > 75%     Locomotion Ambulation   Ambulation assist      Assist level: Contact Guard/Touching assist Assistive device: Walker-rolling Max distance: 150'   Walk 10 feet activity   Assist     Assist level: Contact Guard/Touching assist Assistive device: Walker-rolling   Walk 50 feet activity   Assist    Assist level: Contact Guard/Touching assist Assistive device: Walker-rolling    Walk 150 feet activity  Assist    Assist level: Contact Guard/Touching assist Assistive device: Walker-rolling    Walk 10 feet on uneven surface  activity   Assist Walk 10 feet on uneven surfaces activity did not occur: Safety/medical concerns         Wheelchair     Assist Will patient use wheelchair at discharge?: No             Wheelchair 50 feet with 2 turns activity    Assist            Wheelchair 150 feet activity     Assist          Blood pressure  (!) 125/53, pulse 61, temperature 98.6 F (37 C), resp. rate 16, height 5\' 3"  (1.6 m), SpO2 99 %.  Medical Problem List and Plan: 1.  Impaired mobility and ADLs secondary to left lacunar infarct             -patient may shower             -ELOS/Goals: 16-20 days S-MinA 2.  Antithrombotics: -DVT/anticoagulation:  Pharmaceutical: Lovenox             -antiplatelet therapy:DAPT 3. Pain Management: Pain is well controlled- continue Tylenol prn   12/23- pain controlled- con't meds 4. Mood: LCSW to follow for evaluation and support.              -antipsychotic agents: N/A 5. Neuropsych: This patient is capable of making decisions on herr own behalf. 6. Skin/Wound Care: Routine pressure relief measures.  7. Fluids/Electrolytes/Nutrition: Monitor I/O. Hyponatremic to 134 on 12/21, repeat tomorrow.   8. HTN: BP labile- continue to monitor TID  12/23- Is soft this AM 90s/50s- asymptomatic- will monitor   Vitals:   11/03/20 1935 11/04/20 0517  BP: (!) 126/59 (!) 125/53  Pulse: 64 61  Resp: 17 16  Temp: 98.1 F (36.7 C) 98.6 F (37 C)  SpO2: 99% 99%  Controlled 12/25 9. Anxiety d/o: Continue buspar tid 10 H/o depression: On Zoloft with trazodone to help manage insomnia.  11. Dyslipidemia: Continue high dose atorvastatin.        LOS: 3 days A FACE TO FACE EVALUATION WAS PERFORMED  1/26 11/04/2020, 8:50 AM

## 2020-11-05 ENCOUNTER — Inpatient Hospital Stay (HOSPITAL_COMMUNITY): Payer: Medicaid Other

## 2020-11-05 ENCOUNTER — Inpatient Hospital Stay (HOSPITAL_COMMUNITY): Payer: Medicaid Other | Admitting: Speech Pathology

## 2020-11-05 NOTE — Progress Notes (Signed)
Physical Therapy Session Note  Patient Details  Name: Rebecca Sutton MRN: [id number] Date of Birth: [birth year]  Today's Date: 11/05/2020 PT Individual Time: 1430-1515 PT Individual Time Calculation (min): 45 min   Short Term Goals: Week 1:  PT Short Term Goal 1 (Week 1): Pt will perform bed to chair with CGA. PT Short Term Goal 2 (Week 1): Pt will ambulate 150' with minA and no AD PT Short Term Goal 3 (Week 1): Pt will perform 4 steps with LHR and CGA  Skilled Therapeutic Interventions/Progress Updates:     Patient in recliner with her daughter in the room upon PT arrival. Patient alert and agreeable to PT session. Patient reported 5-6/10 L hip pain during session, RN made aware and provided Tylenol during session. PT provided repositioning, rest breaks, and distraction as pain interventions throughout session.   Therapeutic Activity: Bed Mobility: Patient performed rolling R/L and supine to/from sit on a mat table with mod I for increased time.  Transfers: Patient performed sit to/from stand x9 with CGA-close supervision. Provided verbal cues for scooting forward and forward weight shift.  Gait Training:  Patient ambulated 174 feet using RW with min A and standing rest breaks x3 due to L hip pain and decreased activity tolerance with mild SOB. Ambulated with antalgic gait on L, decreased gait speed, decreased step length and height R>L, steppage gait on R, increased B hip and knee flexion in stance, lateral hip instability L>R, forward trunk lean, and downward head gaze. Provided verbal cues for erect posture, looking ahead, lateral hip muscle activation in stance, heel strike at initial contact to reduce flexor tone when stepping with R, increased R weight shift, and safe use/proximity of RW.  Neuromuscular Re-ed: Patient performed the following lower extremity motor control activities: -bridging 2x10 with cues for equal weight bearing and increased gluteal activation -side-lying hip  abduction Rx8, Lx10 with cues for alignment, and hip IR to activate glut med -mini squats without upper extremity support 2x10 with CGA progressing to min A due to fatigue on second trial -NuStep using upper/lower extremities x6 min 8 sec, level 3 x1 min, level 4 x1 min, level 3 x4 min, average 8-12 steps per minute due to attending to technique and R hand grasp and increased fatigue, provided cues for lower extremity alignment, attention and activation of R upper/lower extremity, vs use of L side to compensate, and increased speed of movement to increased intensity and repetitions during activity  Patient in recliner in the room at end of session with breaks locked, chair alarm set, and all needs within reach.    Therapy Documentation Precautions:  Precautions Precautions: Fall Precaution Comments: R hemiparesis RUE>LE Restrictions Weight Bearing Restrictions: No   Therapy/Group: Individual Therapy  Nashira Mcglynn L Shatisha Falter PT, DPT  11/05/2020, 4:02 PM

## 2020-11-05 NOTE — Progress Notes (Signed)
Physical Therapy Session Note  Patient Details  Name: Rebecca Sutton MRN: [id number] Date of Birth: 18-Aug-1961  Today's Date: 11/05/2020 PT Individual Time: 1110-1205 PT Individual Time Calculation (min): 55 min   Short Term Goals: Week 1:  PT Short Term Goal 1 (Week 1): Pt will perform bed to chair with CGA. PT Short Term Goal 2 (Week 1): Pt will ambulate 150' with minA and no AD PT Short Term Goal 3 (Week 1): Pt will perform 4 steps with LHR and CGA  Skilled Therapeutic Interventions/Progress Updates:     Patient in the w/c headed toward the bathroom with NT upon PT arrival. Patient alert and agreeable to PT session. Patient reported 5/10 L hip pain during ambulation, RN made aware. PT provided repositioning, rest breaks, and distraction as pain interventions throughout session.   Therapeutic Activity: Transfers: Patient performed a toilet transfer w/c<>toilet with CGA. Patient was continent of bladder during toileting, performed peri-care and lower body clothing management independently with CGA for standing balance. She performed sit to/from stand x4 and stand pivot x2 with CGA without an AD throughout session. Provided verbal cues for forward weight shift and increased hip extension in standing R>L.  Gait Training:  Patient ambulated 134 feet and 86 feet without an AD with min A progressing to mod with fatigue and increased L hip pain. Required w/c to be brought up during first trial due to significant R lower extremity fatigue with L hip pain and w/c follow from a second person on second trial. Ambulated with steppage gait on R with flexor synergy, decreased R weight shift, B trendelenburg L>R, mild forward trunk flexion, and downward head gaze. Provided verbal cues for erect posture and looking ahead, facilitated weight shifting to promote increased R stance time and L step length, also cued patient to lead with R heal to reduce flexor synergy in swing, produced mild ankle inversion with  patient able to self correct with weight bearing.   Neuromuscular Re-ed: Patient performed the following R upper/lower extremity motor control activities: -transitioned to quadruped with min A, held quadruped 2x2 min, progressed to alternating lifting hands off the mat 2x5 R/L with min A for balance and facilitation for R upper extremity weight bearing and elbow extension, rested seated on heels between activities -alternating step taps on 6" step without upper extremity support x5 and x10 focused on R hamstring activation to clear R foot to tap and R weight shift and lateral hip activation in stance  Patient required increased time and rest breaks with all activities due to decreased activity tolerance. Educated on gait deficies, balance changes, and stroke recovery during rest breaks. Used teach back method for education at end of session.   Patient in recliner in the room at end of session with breaks locked, chair alarm set, and all needs within reach.    Therapy Documentation Precautions:  Precautions Precautions: Fall Precaution Comments: R hemiparesis RUE>LE Restrictions Weight Bearing Restrictions: No   Therapy/Group: Individual Therapy  Avril Busser L Malaijah Houchen PT, DPT  11/05/2020, 12:44 PM

## 2020-11-05 NOTE — Progress Notes (Signed)
Speech Language Pathology Daily Session Note  Patient Details  Name: Rebecca Sutton MRN: [id number] Date of Birth: Jun 23, 1961  Today's Date: 11/05/2020 SLP Individual Time: 0900-0930 SLP Individual Time Calculation (min): 30 min  Short Term Goals: Week 1: SLP Short Term Goal 1 (Week 1): Pt will use slow rate, inreased vocal intensity, and overarticulation to achieve intelligibility at the conversational level with supervision. SLP Short Term Goal 2 (Week 1): Pt will consume regular textures and thin liquids with mod I use of swallowing precautions and minimal overt s/s of aspiration.  Skilled Therapeutic Interventions: Skilled treatment session focused on dysphagia and speech goals. SLP facilitated session by providing skilled observation with a snack of regular textures. Patient demonstrated efficient mastication with complete oral clearance without overt s/s of aspiration. Therefore, recommend patient continue current diet. Patient was 100% intelligible at the conversation level but required Max verbal and visual cues to recall speech intelligibility strategies. Patient left upright in the recliner with alarm on and all needs within reach. Continue with current plan of care.      Pain No/Denies Pain   Therapy/Group: Individual Therapy  [name] 11/05/2020, 12:45 PM

## 2020-11-05 NOTE — Progress Notes (Signed)
   11/05/20 1000  Clinical Encounter Type  Visited With Patient  Visit Type Initial  Referral From Nurse  Consult/Referral To Chaplain  Spiritual Encounters  Spiritual Needs Literature  Stress Factors  Patient Stress Factors None identified  Family Stress Factors None identified  Patient requested an AD. Chaplain delivered it to room. Patient will discuss with her daughter and call when ready to sign.

## 2020-11-05 NOTE — Progress Notes (Signed)
Occupational Therapy Session Note  Patient Details  Name: Rebecca Sutton MRN: [id number] Date of Birth: 24-Jun-1961  Today's Date: 11/05/2020 OT Individual Time: 5056-9794 OT Individual Time Calculation (min): 60 min   Session 2: OT Individual Time: [phone] OT Individual Time Calculation (min): 30 min    Short Term Goals: Week 1:  OT Short Term Goal 1 (Week 1): Patient will ambulate into bathroom with CGA and LRAD OT Short Term Goal 2 (Week 1): Pt will maintain standing balance with CGA OT Short Term Goal 3 (Week 1): Patient will use R UE to don deodorant  Skilled Therapeutic Interventions/Progress Updates:    Session 1: Pt received supine with LPN present attending to needs. Pt with no c/o pain and agreeable to take shower for morning session. Pt completed bed mobility with supervision, HOB elevated. Ambulatory transfer into the bathroom with min A. Min cueing for safety in doffing clothes. Pt transferred onto TTB in walk in shower. Bathing completed seated with set up assist. Pt spontaneously using RUE to assist with bathing, able to grasp washcloth grossly and reach across midline to wash LUE. Pt able to bring BLE into figure 4 with increased effort and lateral leans to compensate. Reinforced CVA education throughout. Pt returned to w/c with min A functional mobility. Pt donned shirt with supervision. Underwear with CGA. Good recall of hemi techniques during dressing. Oral care completed at the sink with CGA, good use of the RUE to remove toothpaste cap. Pt was taken to the therapy gym via w/c. She completed 9 hole peg test- RUE 4 min 43 seconds. Min facilitation and cueing required to reduce shoulder compensation with [facility]. LUE 33 seconds. Pt returned to her room and transferred to the recliner. Chair pad alarm set and all needs within reach.   Session 2:  Pt received in recliner with no c/o pain, agreeable to session. She was taken to the therapy gym to work with [employer] for [employer].  Pt completed visual scanning with RUE functional reaching activity. She reported fatigue in the shoulder and required min facilitation at the shoulder to reach > 70 degrees of flexion. 80% accuracy and 14 sec reaction time (multiple rest breaks required). Slight incoordination when reaching distally. Discussed CVA recovery throughout session. 2nd trial done with same parameters, 13.8 sec reaction time, 86% accuracy. Pt openly discussing alcoholism and we discussed strategies to reduce drinking, smoking, and healthier eating habits, especially engagement in IADLs. Pt returned to her room and was left sitting in the recliner with all needs met. Chair alarm set.   Therapy Documentation Precautions:  Precautions Precautions: Fall Precaution Comments: R hemiparesis RUE>LE Restrictions Weight Bearing Restrictions: No  Therapy/Group: Individual Therapy  Curtis Sites 11/05/2020, 7:16 AM

## 2020-11-06 ENCOUNTER — Inpatient Hospital Stay (HOSPITAL_COMMUNITY): Payer: Medicaid Other | Admitting: Occupational Therapy

## 2020-11-06 ENCOUNTER — Inpatient Hospital Stay (HOSPITAL_COMMUNITY): Payer: Medicaid Other

## 2020-11-06 ENCOUNTER — Inpatient Hospital Stay (HOSPITAL_COMMUNITY): Payer: Medicaid Other | Admitting: Physical Therapy

## 2020-11-06 ENCOUNTER — Inpatient Hospital Stay (HOSPITAL_COMMUNITY): Payer: Medicaid Other | Admitting: Speech Pathology

## 2020-11-06 LAB — BASIC METABOLIC PANEL
Anion gap: 9 (ref 5–15)
BUN: 21 mg/dL — ABNORMAL HIGH (ref 6–20)
CO2: 25 mmol/L (ref 22–32)
Calcium: 9.6 mg/dL (ref 8.9–10.3)
Chloride: 103 mmol/L (ref 98–111)
Creatinine, Ser: 1.1 mg/dL — ABNORMAL HIGH (ref 0.44–1.00)
GFR, Estimated: 58 mL/min — ABNORMAL LOW (ref >60)
Glucose, Bld: 105 mg/dL — ABNORMAL HIGH (ref 70–99)
Potassium: 4.1 mmol/L (ref 3.5–5.1)
Sodium: 137 mmol/L (ref 135–145)

## 2020-11-06 NOTE — Progress Notes (Signed)
[facility] PHYSICAL MEDICINE & REHABILITATION PROGRESS NOTE   Subjective/Complaints: Patient is showering, working with Amil Amen OT She complains of pain/bleeding from Lovenox injections. Ambulated 175 feet with PT yesterday, Lovenox d/ced. She has no other complaints  ROS neg CP. SOB,N/V/D, + bleeding at Lovenox injection sites  Objective:   No results found. No results for input(s): WBC, HGB, HCT, PLT in the last 72 hours. Recent Labs    11/06/20 0513  NA 137  K 4.1  CL 103  CO2 25  GLUCOSE 105*  BUN 21*  CREATININE 1.10*  CALCIUM 9.6    Intake/Output Summary (Last 24 hours) at 11/06/2020 1140 Last data filed at 11/06/2020 0900 Gross per 24 hour  Intake 420 ml  Output --  Net 420 ml        Physical Exam: Vital Signs Blood pressure (!) 110/58, pulse 66, temperature 98.4 F (36.9 C), temperature source Oral, resp. rate 18, height 5\' 3"  (1.6 m), SpO2 100 %. Physical Exam Gen: no distress, normal appearing HEENT: oral mucosa pink and moist, NCAT Cardio: Reg rate Chest: normal effort, normal rate of breathing Abd: soft, non-distended Ext: no edema Skin: intact Neuro: Pt is cognitively appropriate with normal insight, memory, and awareness. Right facial weakness with mild dysarthria. Right sided weakness. Able to follow simple motor commands without difficulty.  4- RIght delt bi, tri, grip, finger ext , 4- RIght HF, KE , 2- Right ankle DF, 3- R ankle PF Sensation intact Fine motor moderately reduce finger to thumb opposition  Musculoskeletal: Full ROM, No pain with AROM or PROM in the neck, trunk, or extremities. Posture appropriate Psych: appropriate  Assessment/Plan: 1. Functional deficits which require 3+ hours per day of interdisciplinary therapy in a comprehensive inpatient rehab setting.  Physiatrist is providing close team supervision and 24 hour management of active medical problems listed below.  Physiatrist and rehab team continue to assess barriers to  discharge/monitor patient progress toward functional and medical goals  Care Tool:  Bathing    Body parts bathed by patient: Right arm,Left arm,Chest,Front perineal area,Abdomen,Buttocks,Right upper leg,Left lower leg,Face,Left upper leg,Right lower leg         Bathing assist Assist Level: Supervision/Verbal cueing     Upper Body Dressing/Undressing Upper body dressing   What is the patient wearing?: Pull over shirt    Upper body assist Assist Level: Set up assist    Lower Body Dressing/Undressing Lower body dressing      What is the patient wearing?: Underwear/pull up,Pants     Lower body assist Assist for lower body dressing: Contact Guard/Touching assist     Toileting Toileting    Toileting assist Assist for toileting: Supervision/Verbal cueing     Transfers Chair/bed transfer  Transfers assist     Chair/bed transfer assist level: Minimal Assistance - Patient > 75%     Locomotion Ambulation   Ambulation assist      Assist level: Contact Guard/Touching assist Assistive device: Walker-rolling Max distance: 150'   Walk 10 feet activity   Assist     Assist level: Contact Guard/Touching assist Assistive device: Walker-rolling   Walk 50 feet activity   Assist    Assist level: Contact Guard/Touching assist Assistive device: Walker-rolling    Walk 150 feet activity   Assist    Assist level: Contact Guard/Touching assist Assistive device: Walker-rolling    Walk 10 feet on uneven surface  activity   Assist Walk 10 feet on uneven surfaces activity did not occur: Safety/medical concerns  Wheelchair     Assist Will patient use wheelchair at discharge?: No             Wheelchair 50 feet with 2 turns activity    Assist            Wheelchair 150 feet activity     Assist          Blood pressure (!) 110/58, pulse 66, temperature 98.4 F (36.9 C), temperature source Oral, resp. rate 18, height 5'  3" (1.6 m), SpO2 100 %.  Medical Problem List and Plan: 1.  Impaired mobility and ADLs secondary to left lacunar infarct             -patient may shower             -ELOS/Goals: 16-20 days S-MinA  -Continue CIR 2.  Antithrombotics: -DVT/anticoagulation:  Pharmaceutical: Lovenox- d/ced since ambulating >150 feet.              -antiplatelet therapy:DAPT 3. Pain Management:   12/27: pain is well controlled 4. Mood: LCSW to follow for evaluation and support.              -antipsychotic agents: N/A 5. Neuropsych: This patient is capable of making decisions on herr own behalf. 6. Skin/Wound Care: Routine pressure relief measures.  7. Fluids/Electrolytes/Nutrition: Monitor I/O. Hyponatremic to 134 on 12/21,137 on 12/27 8. HTN: BP labile- continue to monitor TID  12/23- Is soft this AM 90s/50s- asymptomatic- will monitor   Vitals:   11/05/20 1932 11/06/20 0313  BP: (!) 111/56 (!) 110/58  Pulse: 70 66  Resp: 18 18  Temp: 97.8 F (36.6 C) 98.4 F (36.9 C)  SpO2: 100% 100%  Controlled 12/25 9. Anxiety d/o: Continue buspar tid 10 H/o depression: On Zoloft with trazodone to help manage insomnia.  11. Dyslipidemia: Continue high dose atorvastatin.  12. AKI: placed nursing order to encourage hydration, repeat Creatinine tomorrow.        LOS: 5 days A FACE TO FACE EVALUATION WAS PERFORMED  Kayna Suppa P Hiep Ollis 11/06/2020, 11:40 AM

## 2020-11-06 NOTE — Progress Notes (Signed)
Physical Therapy Session Note  Patient Details  Name: Rebecca Sutton MRN: [id number] Date of Birth: 12-Dec-1960  Today's Date: 11/06/2020 PT Individual Time: 1017-1059 PT Individual Time Calculation (min): 42 min   Short Term Goals: Week 1:  PT Short Term Goal 1 (Week 1): Pt will perform bed to chair with CGA. PT Short Term Goal 2 (Week 1): Pt will ambulate 150' with minA and no AD PT Short Term Goal 3 (Week 1): Pt will perform 4 steps with LHR and CGA  Skilled Therapeutic Interventions/Progress Updates:     Pt received seated in recliner and agrees to therapy. Reports frustration with RN staff for not attending quickly when call bell is used. No complaint of pain. Stand step transfer to [facility] with CGA and RW, PT cueing for positioning and sequencing. WC transport to gym for time management. Pt performs squat pivot to mat table toward R hemiplegic side with CGA and cues on body mechanics. Pt performs NMR for R lower extremity and standing balance, with L leg propped on step to limit activation. Pt performs sit to stand with minA and PT blocking R knee. Pt rotates to R side to retrieve clothespins and then reaches up and to the R to hand clothespins on bball net. Focusing on weight shifting to the R and eccentric and isometric control with R leg. Pt requires some assistance with R upper extremity to hand clothespins.   Sit to supine with supervision. Pt performs sit to stand from mat with minA and performs lateral weight shifting for increased WB through R leg and NMR. PT blocking R knee and facilitating weight shift due to pt tendency to compensate and majority of weight distribution on L leg. Pt practices performing slight knee bends with R knee and then return to full extension to practice eccentric control and isometric contraction. Pt then performs single leg stance on R leg with PT providing modA for weight shift and stability, with minA blocking of R knee.   Pt performs stand step transfer back  to recliner with CGA. Left seated with alarm intact and all needs within reach.  Therapy Documentation Precautions:  Precautions Precautions: Fall Precaution Comments: R hemiparesis RUE>LE Restrictions Weight Bearing Restrictions: No   Therapy/Group: Individual Therapy  Beau Fanny, PT, DPT 11/06/2020, 4:04 PM

## 2020-11-06 NOTE — Progress Notes (Addendum)
Patient ID: Rebecca Sutton, female   DOB: 1961-09-15, 59 y.o.   MRN: [id number]   SW met with pt in room to inform on challenges with making contact with her dtr Rebecca Sutton. enroourages SW to call her again. SW clarified with pt support at home. Pt reports her mother or daughter Rebecca Sutton will help with transportation to medical appointments. Pt lives in her ex-husband's home and rents out a room, so she is not concerned about finances.   SW left message for [name] again to introduce self, and informed there will be follow-up tomorrow after team conference from co-workers.  *SW spoke with [name] to discuss above. She is aware that there will be follow-up after team conference.   Loralee Pacas, MSW, [place] Office: [phone] Cell: [phone] Fax: [phone]

## 2020-11-06 NOTE — Progress Notes (Signed)
Speech Language Pathology Daily Session Note  Patient Details  Name: Rebecca Sutton MRN: [id number] Date of Birth: 05-04-1961  Today's Date: 11/06/2020 SLP Individual Time: 9024-0973 SLP Individual Time Calculation (min): 55 min  Short Term Goals: Week 1: SLP Short Term Goal 1 (Week 1): Pt will use slow rate, inreased vocal intensity, and overarticulation to achieve intelligibility at the conversational level with supervision. SLP Short Term Goal 2 (Week 1): Pt will consume regular textures and thin liquids with mod I use of swallowing precautions and minimal overt s/s of aspiration.  Skilled Therapeutic Interventions: Skilled treatment session focused on speech and dysphagia goals. Patient was overall Mod I for use speech intelligibility strategies at the conversation level in a moderately distracting environment to achieve 100% intelligibility. Patient also consumed her lunch meal of regular textures with thin liquids without overt s/s of aspiration, therefore, recommend patient continue current diet. Patient left upright in recliner with alarm on and all needs within reach. Continue with current plan of care.        Pain No/Denies Pain   Therapy/Group: Individual Therapy  Joriel Streety 11/06/2020, 3:03 PM

## 2020-11-06 NOTE — Progress Notes (Signed)
Occupational Therapy Session Note  Patient Details  Name: Rebecca Sutton MRN: [id number] Date of Birth: [birth year]  Today's Date: 11/06/2020 OT Individual Time: 1027-2536 OT Individual Time Calculation (min): 60 min    Short Term Goals: Week 1:  OT Short Term Goal 1 (Week 1): Patient will ambulate into bathroom with CGA and LRAD OT Short Term Goal 2 (Week 1): Pt will maintain standing balance with CGA OT Short Term Goal 3 (Week 1): Patient will use R UE to don deodorant  Skilled Therapeutic Interventions/Progress Updates:    Pt received in bed agreeable to ADL training. NO A needed with bed mobility, CGA with sit to stand and ambulation to bathroom to get in shower with RW. Slight knee buckling on R as she stepped into shower. Bathed with set up on bench using lat leans to wash bottom. Pt dried off well and then walked to bed to dress from EOB.  Used figure 4 to don socks and shoes.  CGA with LB dressing in standing. Pt rested briefly and then ambulated to sink to brush hair and teeth. She found it extremely challenging to focus on grooming tasks at the same time as keeping R knee extended.  After brushing hair, she really struggled with teeth so instead had her sit in arm chair so she could just focus on teeth.  Spoke about working on dual tasking as a goal this week.   Pt ambulated to recliner and worked on R shoulder AROM with dowel bar. She said her L shoulder is feeling fatigued from compensating so focused on unilateral activity vs bimanual with bar.  Practiced isometric holds and A/arom as pt only has 110 degrees of flexion.  Demonstrated to pt a few exercises she can do on her own.  Pt resting in recliner with chair alarm on and all needs met.    Therapy Documentation Precautions:  Precautions Precautions: Fall Precaution Comments: R hemiparesis RUE>LE Restrictions Weight Bearing Restrictions: No  Pain: Pain Assessment Pain Score: 0-No pain ADL: ADL Eating: Set  up Grooming: Minimal assistance Upper Body Bathing: Minimal assistance Lower Body Bathing: Minimal assistance Upper Body Dressing: Minimal assistance Lower Body Dressing: Minimal assistance Toileting: Minimal assistance Toilet Transfer: Minimal assistance Tub/Shower Transfer: Minimal assistance   Therapy/Group: Individual Therapy  Shatyra Becka 11/06/2020, 8:52 AM

## 2020-11-06 NOTE — Progress Notes (Signed)
Physical Therapy Session Note  Patient Details  Name: Rebecca Sutton MRN: [id number] Date of Birth: October 04, 1961  Today's Date: 11/06/2020 PT Individual Time: 4098-1191 PT Individual Time Calculation (min): 27 min   Short Term Goals: Week 1:  PT Short Term Goal 1 (Week 1): Pt will perform bed to chair with CGA. PT Short Term Goal 2 (Week 1): Pt will ambulate 150' with minA and no AD PT Short Term Goal 3 (Week 1): Pt will perform 4 steps with LHR and CGA  Skilled Therapeutic Interventions/Progress Updates: Pt presented at [facility] with nsg present completing toileting but agreeable to therapy. Pt denies pain at rest but c/o L hip pain with ambulation. Rest breaks provided as needed. Pt ambulated to ortho gym with RW (~190ft) and CGA but required mod verbal cues for improved heel strike and to demonstrated compensatory strategies with increased hip flexion with knee flexion to advance and promote foot clearance during ambulation. In ortho gym pt participted in NuStep L3 x 6 min with 4 extremities and x 3 min L2 BLE only for general conditioning, RLE strengthening, and forced use of RUE. Once completed pt performed ambulatory transfer to w/c and transported back to room. Performed ambulatory transfer to recliner and pt left in recliner at end of session with seat alarm on, call bell within reach and current needs met.      Therapy Documentation Precautions:  Precautions Precautions: Fall Precaution Comments: R hemiparesis RUE>LE Restrictions Weight Bearing Restrictions: No General:   Vital Signs: Therapy Vitals Temp: 98.3 F (36.8 C) Pulse Rate: 68 Resp: 16 BP: (!) 121/59 Patient Position (if appropriate): Sitting Oxygen Therapy SpO2: 100 % O2 Device: Room Air    Therapy/Group: Individual Therapy  Hristopher Missildine  Eartha Vonbehren, PTA  11/06/2020, 3:49 PM

## 2020-11-07 ENCOUNTER — Inpatient Hospital Stay (HOSPITAL_COMMUNITY): Payer: Medicaid Other | Admitting: Occupational Therapy

## 2020-11-07 ENCOUNTER — Inpatient Hospital Stay (HOSPITAL_COMMUNITY): Payer: Medicaid Other | Admitting: Speech Pathology

## 2020-11-07 ENCOUNTER — Inpatient Hospital Stay (HOSPITAL_COMMUNITY): Payer: Medicaid Other

## 2020-11-07 LAB — BASIC METABOLIC PANEL
Anion gap: 8 (ref 5–15)
BUN: 18 mg/dL (ref 6–20)
CO2: 26 mmol/L (ref 22–32)
Calcium: 9.5 mg/dL (ref 8.9–10.3)
Chloride: 102 mmol/L (ref 98–111)
Creatinine, Ser: 1 mg/dL (ref 0.44–1.00)
GFR, Estimated: >60 mL/min (ref >60)
Glucose, Bld: 99 mg/dL (ref 70–99)
Potassium: 4.4 mmol/L (ref 3.5–5.1)
Sodium: 136 mmol/L (ref 135–145)

## 2020-11-07 MED ORDER — ROPINIROLE HCL 0.25 MG PO TABS
0.2500 mg | ORAL_TABLET | Freq: Every day | ORAL | Status: DC
  Administered 2020-11-07 – 2020-11-09 (×3): 0.25 mg via ORAL
  Filled 2020-11-07 (×3): qty 1

## 2020-11-07 NOTE — Progress Notes (Signed)
[facility] PHYSICAL MEDICINE & REHABILITATION PROGRESS NOTE   Subjective/Complaints: C/o restless legs at night- said this was present at home as well- discussed starting Requip at night and she is agreeable. Labs stable this morning, Cr has normalized. She is happy she no longer requires Lovenox injections.   ROS neg CP. SOB,N/V/D, + bruising at Lovenox injection sites  Objective:   No results found. No results for input(s): WBC, HGB, HCT, PLT in the last 72 hours. Recent Labs    11/06/20 0513 11/07/20 0549  NA 137 136  K 4.1 4.4  CL 103 102  CO2 25 26  GLUCOSE 105* 99  BUN 21* 18  CREATININE 1.10* 1.00  CALCIUM 9.6 9.5    Intake/Output Summary (Last 24 hours) at 11/07/2020 1103 Last data filed at 11/07/2020 0900 Gross per 24 hour  Intake 680 ml  Output 100 ml  Net 580 ml        Physical Exam: Vital Signs Blood pressure 102/65, pulse 78, temperature 98.5 F (36.9 C), temperature source Oral, resp. rate 17, height 5\' 3"  (1.6 m), SpO2 98 %. Physical Exam Gen: no distress, normal appearing HEENT: oral mucosa pink and moist, NCAT Cardio: Reg rate Chest: normal effort, normal rate of breathing Abd: soft, non-distended Ext: no edema Skin: intact Neuro: Pt is cognitively appropriate with normal insight, memory, and awareness. Right facial weakness with mild dysarthria. Right sided weakness. Able to follow simple motor commands without difficulty.  4- RIght delt bi, tri, grip, finger ext , 4- RIght HF, KE , 2- Right ankle DF, 3- R ankle PF Sensation intact Fine motor moderately reduce finger to thumb opposition  Musculoskeletal: Full ROM, No pain with AROM or PROM in the neck, trunk, or extremities. Posture appropriate Psych: appropriate  Assessment/Plan: 1. Functional deficits which require 3+ hours per day of interdisciplinary therapy in a comprehensive inpatient rehab setting.  Physiatrist is providing close team supervision and 24 hour management of active  medical problems listed below.  Physiatrist and rehab team continue to assess barriers to discharge/monitor patient progress toward functional and medical goals  Care Tool:  Bathing    Body parts bathed by patient: Right arm,Left arm,Chest,Front perineal area,Abdomen,Buttocks,Right upper leg,Left lower leg,Face,Left upper leg,Right lower leg         Bathing assist Assist Level: Supervision/Verbal cueing     Upper Body Dressing/Undressing Upper body dressing   What is the patient wearing?: Pull over shirt    Upper body assist Assist Level: Set up assist    Lower Body Dressing/Undressing Lower body dressing      What is the patient wearing?: Underwear/pull up,Pants     Lower body assist Assist for lower body dressing: Contact Guard/Touching assist     Toileting Toileting    Toileting assist Assist for toileting: Supervision/Verbal cueing     Transfers Chair/bed transfer  Transfers assist     Chair/bed transfer assist level: Minimal Assistance - Patient > 75%     Locomotion Ambulation   Ambulation assist      Assist level: Contact Guard/Touching assist Assistive device: Walker-rolling Max distance: 15'   Walk 10 feet activity   Assist     Assist level: Contact Guard/Touching assist Assistive device: Walker-rolling   Walk 50 feet activity   Assist    Assist level: Contact Guard/Touching assist Assistive device: Walker-rolling    Walk 150 feet activity   Assist    Assist level: Contact Guard/Touching assist Assistive device: Walker-rolling    Walk 10 feet  on uneven surface  activity   Assist Walk 10 feet on uneven surfaces activity did not occur: Safety/medical concerns         Wheelchair     Assist Will patient use wheelchair at discharge?: No             Wheelchair 50 feet with 2 turns activity    Assist            Wheelchair 150 feet activity     Assist          Blood pressure 102/65,  pulse 78, temperature 98.5 F (36.9 C), temperature source Oral, resp. rate 17, height 5\' 3"  (1.6 m), SpO2 98 %.  Medical Problem List and Plan: 1.  Impaired mobility and ADLs secondary to left lacunar infarct             -patient may shower             -ELOS/Goals: 16-20 days S-MinA  -Continue CIR 2.  Antithrombotics: -DVT/anticoagulation:  Pharmaceutical: Lovenox- d/ced since ambulating >150 feet.              -antiplatelet therapy:DAPT 3. Pain Management:   12/28: pain is well controlled, continue to monitor.  4. Mood: LCSW to follow for evaluation and support.              -antipsychotic agents: N/A 5. Neuropsych: This patient is capable of making decisions on herr own behalf. 6. Skin/Wound Care: Routine pressure relief measures.  7. Fluids/Electrolytes/Nutrition: Monitor I/O. Hyponatremic to 134 on 12/21,137 on 12/27 8. HTN: BP labile- continue to monitor TID  12/23- Is soft this AM 90s/50s- asymptomatic- will monitor   Vitals:   11/06/20 1346 11/07/20 0424  BP: (!) 121/59 102/65  Pulse: 68 78  Resp: 16 17  Temp: 98.3 F (36.8 C) 98.5 F (36.9 C)  SpO2: 100% 98%  Hypotensive 12/28- not on any HTN meds- continue to monitor.  9. Anxiety d/o: Continue buspar tid 10 H/o depression: On Zoloft with trazodone to help manage insomnia.  11. Dyslipidemia: Continue high dose atorvastatin.  12. AKI: placed nursing order to encourage hydration, Cr normalized on 12/28, continue to monitor outpatient.  13. Restless legs syndrome: start Requip 0.25mg  HS   LOS: 6 days A FACE TO FACE EVALUATION WAS PERFORMED  Sivan Quast P Quy Lotts 11/07/2020, 11:03 AM

## 2020-11-07 NOTE — Progress Notes (Signed)
Physical Therapy Session Note  Patient Details  Name: Rebecca Sutton MRN: [id number] Date of Birth: 19-Jun-1961  Today's Date: 11/07/2020 PT Individual Time: 4098-1191 PT Individual Time Calculation (min): 42 min   Short Term Goals: Week 1:  PT Short Term Goal 1 (Week 1): Pt will perform bed to chair with CGA. PT Short Term Goal 2 (Week 1): Pt will ambulate 150' with minA and no AD PT Short Term Goal 3 (Week 1): Pt will perform 4 steps with LHR and CGA  Skilled Therapeutic Interventions/Progress Updates:     Pt received seated in WC and agrees to therapy. No complaint of pain. WC transport to gym for time management. Pt performs sit to stand with close supervision with cues on body mechanics. Pt ambulates 100' with RW and primarily CGA but with fatigue gait pattern deteriorates and pt has LOB, requiring modA to prevent fall. Pt with improved reciprocal gait pattern and improved lateral weight shift to the R. Pt performs transfer training with cues on hand placement and body mechanics, cued to place R hand over R distal thigh and L hand over R to encourage lateral R weight shift. Pt completes x5 with CGA/minA. In standing, pt performs NMR for midline positioning and even WB between both feet, progressing to slowly lifting L foot while using R leg for stability. PT provides min blocking of R knee. Pt performs slight knee bends and then return to extension with verbal and tactile cues 2x10. Pt has difficulty with eccentric control and also occasionally snaps knee into extension. Pt ambulates additional 100' with RW and minA. Stand step trasnsfer to recliner with CGA. Left seated in recliner with alarm intact and all needs within reach.  Therapy Documentation Precautions:  Precautions Precautions: Fall Precaution Comments: R hemiparesis RUE>LE Restrictions Weight Bearing Restrictions: No    Therapy/Group: Individual Therapy  Beau Fanny, PT, DPT 11/07/2020, 12:53 PM

## 2020-11-07 NOTE — Progress Notes (Signed)
Occupational Therapy Session Note  Patient Details  Name: Rebecca Sutton MRN: [id number] Date of Birth: 1960/11/18  Today's Date: 11/07/2020  Session 1 OT Individual Time: 1100-1200 OT Individual Time Calculation (min): 60 min   Session 2 OT Individual Time: 1425-1505 OT Individual Time Calculation (min): 40 min    Short Term Goals: Week 1:  OT Short Term Goal 1 (Week 1): Patient will ambulate into bathroom with CGA and LRAD OT Short Term Goal 2 (Week 1): Pt will maintain standing balance with CGA OT Short Term Goal 3 (Week 1): Patient will use R UE to don deodorant  Skilled Therapeutic Interventions/Progress Updates:  Session 1   Patient greeted seated in recliner and agreeable to OT treatment session. Pt declined any BADL tasks today. Functional ambulation in the room w/ RW and CGA to get to the wc. OT brought pt to dayroom and worked on R hand strength.coordination with soft red theraputty exercises. Focus on in-hand manipulation, grip, and pinch. Progressed to fine motor task of picking small beads out of putty. Continued working on functional use of R UE with peg board puzzle using R hand. Pt returned to room and ambulated back to recliner with RW and min A. Pt left seated in recliner with chair alarm on, call bell in reach, and needs met.   Session 2 Pt greeted sitting in recliner and agreeable to OT treatment session. Pt reported need to urinate. Pt ambulated to the bathroom w. RW and CGA. CGA for balance when standing to doff pants. Pt voided bladder and completed peri-care. OT educated on RW positioning at the sink while washing hands. UB there-ex with 10 mins on [employer] arm bike with 2 rest breaks. Functional ambulation in gym with focus on maintaining  Balance when turning. Pt returned to room and ambulated to recliner with RW and CGA. Pt left seated in recliner with alarm on and needs met.   Therapy Documentation Precautions:  Precautions Precautions: Fall Precaution  Comments: R hemiparesis RUE>LE Restrictions Weight Bearing Restrictions: No Pain:  denies pain   Therapy/Group: Individual Therapy  Valma Cava 11/07/2020, 3:25 PM

## 2020-11-07 NOTE — Progress Notes (Signed)
Patient ID: Rebecca Sutton, female   DOB: [birth year], 59 y.o.   MRN: [id number]  Covering for primary sw, Auria.  Sw confirmed patient will be discharging to mother's home, 3 steps to enter.  [place], [place] [phone]

## 2020-11-07 NOTE — Progress Notes (Signed)
Speech Language Pathology Daily Session Note  Patient Details  Name: Rebecca Sutton MRN: [id number] Date of Birth: 1961-09-19  Today's Date: 11/07/2020 SLP Individual Time: 4132-4401 SLP Individual Time Calculation (min): 55 min  Short Term Goals: Week 1: SLP Short Term Goal 1 (Week 1): Pt will use slow rate, inreased vocal intensity, and overarticulation to achieve intelligibility at the conversational level with supervision. SLP Short Term Goal 2 (Week 1): Pt will consume regular textures and thin liquids with mod I use of swallowing precautions and minimal overt s/s of aspiration.  Skilled Therapeutic Interventions: Skilled treatment session focused on communication goals. SLP facilitated session by providing intermittent verbal cues for use of speech intelligibility strategies at the conversation level to achieve 100% intelligibility. Intermittent supervision verbal cues were also needed for use of speech intelligibility strategies while reading aloud at the paragraph level. Patient left upright in the recliner with alarm on and all needs within reach. Continue with current plan of care.      Pain No/Denies Pain   Therapy/Group: Individual Therapy  [name] 11/07/2020, 2:58 PM

## 2020-11-07 NOTE — Progress Notes (Signed)
Patient ID: Rebecca Sutton, female   DOB: Mar 11, 1961, 60 y.o.   MRN: [id number] Team Conference Report to Patient/Family  Team Conference discussion was reviewed with the patient and caregiver, including goals, any changes in plan of care and target discharge date.  Patient and caregiver express understanding and are in agreement.  The patient has a target discharge date of 11/15/20.  [name] 11/07/2020, 1:22 PM

## 2020-11-07 NOTE — Progress Notes (Signed)
Patient discharged home with family, all belongings sent with patient. No complications noted at this time. Pollie Poma D Kiffany Schelling, LPN 

## 2020-11-07 NOTE — Patient Care Conference (Signed)
Inpatient RehabilitationTeam Conference and Plan of Care Update Date: 11/07/2020   Time: 10:33 AM    Patient Name: Rebecca Sutton      Medical Record Number: [id number]  Date of Birth: 09/15/1961 Sex: Female         Room/Bed: 4M11C/4M11C-01 Payor Info: Payor: MEDICAID [place] / Plan: MEDICAID [place]-FAMILY PLANNING / Product Type: *No Product type* /    Admit Date/Time:  11/01/2020  3:10 PM  Primary Diagnosis:  Left sided lacunar infarction [facility])  Hospital Problems: Principal Problem:   Left sided lacunar infarction [facility]) Active Problems:   Sensorineural hearing loss (SNHL)   Coronary artery disease involving native coronary artery of native heart without angina pectoris   Alcohol consumption binge drinking   History of CVA (cerebrovascular accident)   Right hemiparesis (HCC)   Hyperglycemia   Stroke [facility])    Expected Discharge Date: Expected Discharge Date: 11/15/20  Team Members Present: Physician leading conference: Dr. Sula Soda Care Coodinator Present: Lavera Guise, BSW;Iesha Summerhill Marlyne Beards, RN, BSN, CRRN Nurse Present: Jesusita Oka, LPN PT Present: Malachi Pro, PT OT Present: Kearney Hard, OT SLP Present: Feliberto Gottron, SLP PPS Coordinator present : Edson Snowball, Park Breed, SLP     Current Status/Progress Goal Weekly Team Focus  Bowel/Bladder   continent of B/B, last BM-12/26  remain continent B/B  assess Q shift and PRN   Swallow/Nutrition/ Hydration   Regular textures with thin liquids, Mod I  Mod I  tolerance of upgrade   ADL's   CGA transfers with RW (occasional knee buckling),supervision bathing on tub bench (used lateral leans); CGA LB dressing/toileting  supervision goals  ADL training, RUE/RLE NMR, balance, pt education   Mobility   mod(I) bed mobility, CGA sit to stand with RW, minA without AD, minA gait 175' with RW, modA 150' without AD  supervision  RLE NMR, balance, ambulation   Communication   Supervision-Mod I for use of speech  intelligibility strategies at conversation level in distracting enviornment  Mod I  use of speech strategies with increased noisy enviornment   Safety/Cognition/ Behavioral Observations            Pain   No c/o pain  pain<3  assess Q shift and PRN   Skin   incision-groin  proper healing, no infection.  assess Q shift and PRN     Discharge Planning:  Pt is uninsured. D/c to her home with supervision from mother.   Team Discussion: Complains of restless leg syndrome at night, will start on low dose of Requip, 0.25 mg, HS, dc'd Lovenox injections. Continent B/B, bruising to abdomin. Plan is to discharge home alone. OT reports patient is min assist to contact guard overall. She has supervision goals.  PT reports patient has stated she is going home with her mother. She tripped over her strong foot and was a mod assist to keep from falling. She has supervision goals. SLP reports patient is working on safety with regular textures. She has mod I goals. Patient on target to meet rehab goals: yes  *See Care Plan and progress notes for long and short-term goals.   Revisions to Treatment Plan:  Increase endurance Starting Requip for restless leg Stopped Lovenox  Teaching Needs: Family education Safety education  Current Barriers to Discharge: Decreased caregiver support, Home enviroment access/layout, Lack of/limited family support, Medication compliance and Behavior  Possible Resolutions to Barriers: Continue current medications, education medication management, provide emotional support to patient and family.     Medical Summary Current Status:  restless leg syndrome at night, tripped during therapy today, poor endurance, dysphagia, bruising and pain from Lovenox injections  Barriers to Discharge: Medical stability;Decreased family/caregiver support  Barriers to Discharge Comments: restless leg syndrome at night, tripped during therapy today, poor endurance, dysphagia, bruising and pain  from Lovenox injections Possible Resolutions to [employer] Focus: Start Requip 0.25mg  HS, stop Lovenox injections   Continued Need for Acute Rehabilitation Level of Care: The patient requires daily medical management by a physician with specialized training in physical medicine and rehabilitation for the following reasons: Direction of a multidisciplinary physical rehabilitation program to maximize functional independence : Yes Medical management of patient stability for increased activity during participation in an intensive rehabilitation regime.: Yes Analysis of laboratory values and/or radiology reports with any subsequent need for medication adjustment and/or medical intervention. : Yes   I attest that I was present, lead the team conference, and concur with the assessment and plan of the team.   Rebecca Sutton 11/07/2020, 3:16 PM

## 2020-11-08 ENCOUNTER — Inpatient Hospital Stay (HOSPITAL_COMMUNITY): Payer: Medicaid Other

## 2020-11-08 ENCOUNTER — Inpatient Hospital Stay (HOSPITAL_COMMUNITY): Payer: Medicaid Other | Admitting: Speech Pathology

## 2020-11-08 NOTE — Progress Notes (Signed)
Speech Language Pathology Daily Session Note  Patient Details  Name: Rebecca Sutton MRN: [id number] Date of Birth: 1961-04-28  Today's Date: 11/08/2020 SLP Individual Time: 1100-1140 SLP Individual Time Calculation (min): 40 min  Short Term Goals: Week 1: SLP Short Term Goal 1 (Week 1): Pt will use slow rate, inreased vocal intensity, and overarticulation to achieve intelligibility at the conversational level with supervision. SLP Short Term Goal 2 (Week 1): Pt will consume regular textures and thin liquids with mod I use of swallowing precautions and minimal overt s/s of aspiration.  Skilled Therapeutic Interventions: Skilled treatment session focused on speech goals. Overall, patient appeared more lethargic today resulting in decreased speech intelligibility at the sentence level. SLP facilitated session by providing education in regards to effect of fatigue on speech function and use of strategies to maximize intelligibility. Patient participated in a phonetic discrimination task that she completed with 90% intelligibility. Patient left upright in the recliner with alarm on and all needs within reach. Continue with current plan of care.       Pain No/Denies Pain   Therapy/Group: Individual Therapy  [place] Marchiano 11/08/2020, 3:11 PM

## 2020-11-08 NOTE — Progress Notes (Addendum)
[place] PHYSICAL MEDICINE & REHABILITATION PROGRESS NOTE   Subjective/Complaints: Symptoms of restless leg syndrome improved with Requip. She c/o generalized muscle soreness from therapy No additional complaints.    ROS neg CP. SOB,N/V/D, + generalized muscle soreness.  Objective:   No results found. No results for input(s): WBC, HGB, HCT, PLT in the last 72 hours. Recent Labs    11/06/20 0513 11/07/20 0549  NA 137 136  K 4.1 4.4  CL 103 102  CO2 25 26  GLUCOSE 105* 99  BUN 21* 18  CREATININE 1.10* 1.00  CALCIUM 9.6 9.5    Intake/Output Summary (Last 24 hours) at 11/08/2020 0857 Last data filed at 11/08/2020 0700 Gross per 24 hour  Intake 800 ml  Output --  Net 800 ml        Physical Exam: Vital Signs Blood pressure 102/65, pulse 74, temperature 98.7 F (37.1 C), resp. rate 17, height 5\' 3"  (1.6 m), SpO2 100 %. Physical Exam Gen: no distress, normal appearing HEENT: oral mucosa pink and moist, NCAT Cardio: Reg rate Chest: normal effort, normal rate of breathing Abd: soft, non-distended Ext: no edema Skin: intact Neuro: Pt is cognitively appropriate with normal insight, memory, and awareness. Right facial weakness with mild dysarthria. Right sided weakness. Able to follow simple motor commands without difficulty.  4- RIght delt bi, tri, grip, finger ext , 4- RIght HF, KE , 2- Right ankle DF, 3- R ankle PF Sensation intact Fine motor moderately reduce finger to thumb opposition  Musculoskeletal: Full ROM, No pain with AROM or PROM in the neck, trunk, or extremities. Posture appropriate Psych: appropriate  Assessment/Plan: 1. Functional deficits which require 3+ hours per day of interdisciplinary therapy in a comprehensive inpatient rehab setting.  Physiatrist is providing close team supervision and 24 hour management of active medical problems listed below.  Physiatrist and rehab team continue to assess barriers to discharge/monitor patient progress  toward functional and medical goals  Care Tool:  Bathing    Body parts bathed by patient: Right arm,Left arm,Chest,Front perineal area,Abdomen,Buttocks,Right upper leg,Left lower leg,Face,Left upper leg,Right lower leg         Bathing assist Assist Level: Supervision/Verbal cueing     Upper Body Dressing/Undressing Upper body dressing   What is the patient wearing?: Pull over shirt    Upper body assist Assist Level: Set up assist    Lower Body Dressing/Undressing Lower body dressing      What is the patient wearing?: Underwear/pull up,Pants     Lower body assist Assist for lower body dressing: Contact Guard/Touching assist     Toileting Toileting    Toileting assist Assist for toileting: Supervision/Verbal cueing     Transfers Chair/bed transfer  Transfers assist     Chair/bed transfer assist level: Minimal Assistance - Patient > 75%     Locomotion Ambulation   Ambulation assist      Assist level: Moderate Assistance - Patient 50 - 74% Assistive device: Walker-rolling Max distance: 100'   Walk 10 feet activity   Assist     Assist level: Contact Guard/Touching assist Assistive device: Walker-rolling   Walk 50 feet activity   Assist    Assist level: Moderate Assistance - Patient - 50 - 74% Assistive device: Walker-rolling    Walk 150 feet activity   Assist    Assist level: Contact Guard/Touching assist Assistive device: Walker-rolling    Walk 10 feet on uneven surface  activity   Assist Walk 10 feet on uneven surfaces activity did  not occur: Safety/medical concerns         Wheelchair     Assist Will patient use wheelchair at discharge?: No             Wheelchair 50 feet with 2 turns activity    Assist            Wheelchair 150 feet activity     Assist          Blood pressure 102/65, pulse 74, temperature 98.7 F (37.1 C), resp. rate 17, height 5\' 3"  (1.6 m), SpO2 100 %.  Medical Problem  List and Plan: 1.  Impaired mobility and ADLs secondary to left lacunar infarct             -patient may shower             -ELOS/Goals: 16-20 days S-MinA  -Continue CIR 2.  Antithrombotics: -DVT/anticoagulation:  Pharmaceutical: Lovenox- d/ced since ambulating >150 feet.              -antiplatelet therapy:DAPT 3. Pain Management:   12/29: pain is well controlled, continue to monitor.  4. Mood: LCSW to follow for evaluation and support.              -antipsychotic agents: N/A 5. Neuropsych: This patient is capable of making decisions on herr own behalf. 6. Skin/Wound Care: Routine pressure relief measures.  7. Fluids/Electrolytes/Nutrition: Monitor I/O. Hyponatremic to 134 on 12/21,137 on 12/27 8. HTN: BP labile- continue to monitor TID  12/23- Is soft this AM 90s/50s- asymptomatic- will monitor   Vitals:   11/07/20 1933 11/08/20 0320  BP: (!) 110/57 102/65  Pulse: 75 74  Resp: 18 17  Temp: 98.7 F (37.1 C) 98.7 F (37.1 C)  SpO2: 99% 100%  Hypotensive 12/29- not on any HTN meds- continue to monitor.  9. Anxiety d/o: Continue buspar tid 10 H/o depression: On Zoloft with trazodone to help manage insomnia.  11. Dyslipidemia: Continue high dose atorvastatin.  12. AKI: placed nursing order to encourage hydration, Cr normalized on 12/28, continue to monitor outpatient.  13. Restless legs syndrome: continue Requip 0.25mg  HS which improved symptoms last night.  14. Urinary frequency: UA/UC ordered   LOS: 7 days A FACE TO FACE EVALUATION WAS PERFORMED  1/29 Damita Eppard 11/08/2020, 8:57 AM

## 2020-11-08 NOTE — Progress Notes (Signed)
Physical Therapy Session Note  Patient Details  Name: Rebecca Sutton MRN: [id number] Date of Birth: 03/29/1961  Today's Date: 11/08/2020 PT Individual Time: 7628-3151 PT Individual Time Calculation (min): 57 min   Short Term Goals: Week 1:  PT Short Term Goal 1 (Week 1): Pt will perform bed to chair with CGA. PT Short Term Goal 2 (Week 1): Pt will ambulate 150' with minA and no AD PT Short Term Goal 3 (Week 1): Pt will perform 4 steps with LHR and CGA  Skilled Therapeutic Interventions/Progress Updates:     Pt received seated in recliner and agrees to therapy. Reports soreness in left shoulder. Number not provided. PT provides rest breaks as needed to manage pain symptoms. Stand step transfer to [facility] with supervision and cues for hand placement, sequencing, and positioning WC transport to gym for time management. Squat pivot transfer from [facility] to mat table to the R hemiplegic side with minA to promote WB through R leg. Sit to supine with cues on positioning. Pt performs supine single leg bridges with R foot on mat and left leg crossed over right to promote increased R hemibody WB. Pt completes 3x10 with 10 second hold on final rep of each set. PT cues for mechanics and provides tactile cueing for R quad and glute facilitation.  Pt ambulates multiple bouts with RW. Initially pt ambulates 100' with RW and CGA, catching R toe consistently during swing phase. PT wraps pt's R leg with ace wrapt to facilitate dorsiflexion and eversion and pt ambulates additional 100' with CGA and improved gait pattern. PT cues for focus on increasing heel strike on initial contact, increased knee extension at end of swing phase, and eccentric control of knee flexion during early stance.  Pt performs R ankle pumps with ace wrap on to provide resistance in plantarflexion and facilitation of dorsiflexion 2x15. Pt performs sit to stand from edge of mat and works on attaining neutral posture with symmetrical WB between L and R  legs. Squat pivot back to WC with minA. Pt performs ambulatory transfer to recliner with close supervision. Left seated in recliner with alarm intact and all needs within reach.    Therapy Documentation Precautions:  Precautions Precautions: Fall Precaution Comments: R hemiparesis RUE>LE Restrictions Weight Bearing Restrictions: No   Therapy/Group: Individual Therapy  Beau Fanny, PT, DPT 11/08/2020, 4:33 PM

## 2020-11-08 NOTE — Progress Notes (Signed)
Occupational Therapy Session Note  Patient Details  Name: Rebecca Sutton MRN: [id number] Date of Birth: 08/21/1961  Today's Date: 11/08/2020 OT Individual Time: 0705-0800 OT Individual Time Calculation (min): 55 min    Short Term Goals: Week 1:  OT Short Term Goal 1 (Week 1): Patient will ambulate into bathroom with CGA and LRAD OT Short Term Goal 2 (Week 1): Pt will maintain standing balance with CGA OT Short Term Goal 3 (Week 1): Patient will use R UE to don deodorant  Skilled Therapeutic Interventions/Progress Updates:    1;1. Pt received in bed agreeable to OT after encouragement and increased time to eat breakfast. Pt reporting not a morning person and OT alerted scheduling to wait for sessions to be after 8am. Pt agreed with this. Pt self feeding with RUE with mild dysmetria in RUE and VC for resting R elbow onto table to decrease degrees of freedom and improve stability of hand to mouth excusion. Pt completes all mobility with min A except MOD A for initial power up from EOB with no UE push up on EOB with posterior bias. Pt completes toileting/ambualtory transfer with RW to bathroom with MIN A for balance and clothing management in standing. Pt gathers clothing from closet in standing with MIN A and VC for draping selected clothing over walker for tranport to sink. Pt grooms and completes UB dressing seated with set up for energy conservation. Pt completes LB dressing with MIN A at sit to stand level at sink. Pt transfers back to recliner with MIN A and RW and reviewed the following arm stretches to LUE pain relief.  Access Code: [facility] URL: [email or web address] Date: 11/08/2020 Prepared by: Blanch Media  Exercises (reviewed all should be done from sitting) Standing Shoulder Posterior Capsule Stretch - 1 x daily - 7 x weekly - 3 sets - 10 reps Standing Shoulder Internal Rotation Stretch with Towel - 1 x daily - 7 x weekly - 3 sets - 10 reps Seated Cervical  Sidebending Stretch - 1 x daily - 7 x weekly - 3 sets - 10 reps Supine Chest Stretch with Elbows Bent - 1 x daily - 7 x weekly - 3 sets - 10 reps Seated Levator Scapulae Stretch - 1 x daily - 7 x weekly - 3 sets - 10 reps Reverse Prayer Stretch - 1 x daily - 7 x weekly - 3 sets - 10 reps Standing Lower Cervical and Upper Thoracic Stretch - 1 x daily - 7 x weekly - 3 sets - 10 reps Standing Overhead Triceps Stretch - 1 x daily - 7 x weekly - 3 sets - 10 reps  Exited session with pt seated in reclienr, exit alarm on and call light in reach    Therapy Documentation Precautions:  Precautions Precautions: Fall Precaution Comments: R hemiparesis RUE>LE Restrictions Weight Bearing Restrictions: No General:   Vital Signs: Therapy Vitals Temp: 98.7 F (37.1 C) Pulse Rate: 74 Resp: 17 BP: 102/65 Patient Position (if appropriate): Lying Oxygen Therapy SpO2: 100 % O2 Device: Room Air Pain:   ADL: ADL Eating: Set up Grooming: Minimal assistance Upper Body Bathing: Minimal assistance Lower Body Bathing: Minimal assistance Upper Body Dressing: Minimal assistance Lower Body Dressing: Minimal assistance Toileting: Minimal assistance Toilet Transfer: Minimal assistance Tub/Shower Transfer: Minimal assistance Vision   Perception    Praxis   Exercises:   Other Treatments:     Therapy/Group: Individual Therapy  [name] 11/08/2020, 6:56 AM

## 2020-11-08 NOTE — Progress Notes (Signed)
Physical Therapy Session Note  Patient Details  Name: Rebecca Sutton MRN: [id number] Date of Birth: Apr 04, 1961  Today's Date: 11/08/2020 PT Individual Time: 4128-7867 PT Individual Time Calculation (min): 43 min   Short Term Goals: Week 1:  PT Short Term Goal 1 (Week 1): Pt will perform bed to chair with CGA. PT Short Term Goal 2 (Week 1): Pt will ambulate 150' with minA and no AD PT Short Term Goal 3 (Week 1): Pt will perform 4 steps with LHR and CGA  Skilled Therapeutic Interventions/Progress Updates:    Pt received seated in recliner and agreeable to therapy, no reports of pain resting but reports mild L hip pain after ambulating >193ft with CGA and RW. Pain unrated and rest breaks provided throughout session for pain management. Performed stand pivot transfer with CGA from recliner to w/c with cues for hand positioning. W/c transport to gym for time management. She ambulated ~153ft with CGA and RW while on level surfaces. Gait deficits include intermittent R toe drag, mild RLE circumduction. Cues for proximity to RW, upright posture, increasing R heel strike, and improving R foot clearance. Gait deficits worsen with fatigue and with distractions or head turns. She then ambulated an additional 2x37ft with CGA and RW and navigated up/down x8 steps with CGA/minA and L handrail support, with cues provided for ascending with R foot and descending with L foot leading. No knee buckling or LOB noted while completing stairs. Then completed standing there-ex with minA guard: -1x10 standing front arm raises with 3# dowel rod -1x10 standing bicep curls with 3# dowel rod -1x10 shldr press (unweighted and AAROM for end range for RUE) *Noted hanging on R knee posterior ligaments with knee hyperextension. Required cues for "athletic stance" to improve R knee stability.   Pt returned to her room in w/c with totalA for time management, stand<>pivot with close supervision from w/c to EOB and able to perform  sit>supine with supervision. Remained supine in bed with bed alarm on and needs in reach at end of session.  Therapy Documentation Precautions:  Precautions Precautions: Fall Precaution Comments: R hemiparesis RUE>LE Restrictions Weight Bearing Restrictions: No  Therapy/Group: Individual Therapy  Yehia Mcbain P Zamier Eggebrecht PT 11/08/2020, 2:31 PM

## 2020-11-09 ENCOUNTER — Inpatient Hospital Stay (HOSPITAL_COMMUNITY): Payer: Medicaid Other | Admitting: Occupational Therapy

## 2020-11-09 ENCOUNTER — Inpatient Hospital Stay (HOSPITAL_COMMUNITY): Payer: Medicaid Other | Admitting: Speech Pathology

## 2020-11-09 ENCOUNTER — Inpatient Hospital Stay (HOSPITAL_COMMUNITY): Payer: Medicaid Other

## 2020-11-09 LAB — URINALYSIS, ROUTINE W REFLEX MICROSCOPIC
Bilirubin Urine: NEGATIVE
Glucose, UA: NEGATIVE mg/dL
Ketones, ur: NEGATIVE mg/dL
Nitrite: NEGATIVE
Protein, ur: NEGATIVE mg/dL
Specific Gravity, Urine: 1.006 (ref 1.005–1.030)
WBC, UA: >50 WBC/hpf — ABNORMAL HIGH (ref 0–5)
pH: 6 (ref 5.0–8.0)

## 2020-11-09 MED ORDER — CEPHALEXIN 250 MG PO CAPS
500.0000 mg | ORAL_CAPSULE | Freq: Two times a day (BID) | ORAL | Status: DC
  Administered 2020-11-09 – 2020-11-12 (×7): 500 mg via ORAL
  Filled 2020-11-09 (×7): qty 2

## 2020-11-09 NOTE — Progress Notes (Signed)
[facility] PHYSICAL MEDICINE & REHABILITATION PROGRESS NOTE   Subjective/Complaints: Restless legs still present but much improved- continue Requip 0.25mg   Asks for how long she will have bruise in right lower abdomen  ROS: neg CP. SOB,N/V/D, + generalized muscle soreness, +restless leg syndrome  Objective:   No results found. No results for input(s): WBC, HGB, HCT, PLT in the last 72 hours. Recent Labs    11/07/20 0549  NA 136  K 4.4  CL 102  CO2 26  GLUCOSE 99  BUN 18  CREATININE 1.00  CALCIUM 9.5    Intake/Output Summary (Last 24 hours) at 11/09/2020 1319 Last data filed at 11/09/2020 0856 Gross per 24 hour  Intake 560 ml  Output --  Net 560 ml        Physical Exam: Vital Signs Blood pressure 99/63, pulse 71, temperature 97.9 F (36.6 C), temperature source Oral, resp. rate 16, height 5\' 3"  (1.6 m), SpO2 98 %. Physical Exam Gen: no distress, normal appearing HEENT: oral mucosa pink and moist, NCAT Cardio: Reg rate Chest: normal effort, normal rate of breathing Abd: soft, non-distended Ext: no edema Skin: bruise on right lower quadrant with palpable nodule Neuro: Pt is cognitively appropriate with normal insight, memory, and awareness. Right facial weakness with mild dysarthria. Right sided weakness. Able to follow simple motor commands without difficulty.  4- RIght delt bi, tri, grip, finger ext , 4- RIght HF, KE , 2- Right ankle DF, 3- R ankle PF Sensation intact Fine motor moderately reduce finger to thumb opposition  Musculoskeletal: Full ROM, No pain with AROM or PROM in the neck, trunk, or extremities. Posture appropriate Psych: appropriate  Assessment/Plan: 1. Functional deficits which require 3+ hours per day of interdisciplinary therapy in a comprehensive inpatient rehab setting.  Physiatrist is providing close team supervision and 24 hour management of active medical problems listed below.  Physiatrist and rehab team continue to assess barriers  to discharge/monitor patient progress toward functional and medical goals  Care Tool:  Bathing    Body parts bathed by patient: Right arm,Left arm,Chest,Front perineal area,Abdomen,Buttocks,Right upper leg,Left lower leg,Face,Left upper leg,Right lower leg         Bathing assist Assist Level: Supervision/Verbal cueing     Upper Body Dressing/Undressing Upper body dressing   What is the patient wearing?: Pull over shirt    Upper body assist Assist Level: Supervision/Verbal cueing    Lower Body Dressing/Undressing Lower body dressing      What is the patient wearing?: Underwear/pull up,Pants     Lower body assist Assist for lower body dressing: Contact Guard/Touching assist     Toileting Toileting    Toileting assist Assist for toileting: Supervision/Verbal cueing     Transfers Chair/bed transfer  Transfers assist     Chair/bed transfer assist level: Minimal Assistance - Patient > 75%     Locomotion Ambulation   Ambulation assist      Assist level: Contact Guard/Touching assist Assistive device: Walker-rolling Max distance: 100'   Walk 10 feet activity   Assist     Assist level: Contact Guard/Touching assist Assistive device: Walker-rolling   Walk 50 feet activity   Assist    Assist level: Contact Guard/Touching assist Assistive device: Walker-rolling    Walk 150 feet activity   Assist    Assist level: Contact Guard/Touching assist Assistive device: Walker-rolling    Walk 10 feet on uneven surface  activity   Assist Walk 10 feet on uneven surfaces activity did not occur: Safety/medical concerns  Wheelchair     Assist Will patient use wheelchair at discharge?: No             Wheelchair 50 feet with 2 turns activity    Assist            Wheelchair 150 feet activity     Assist          Blood pressure 99/63, pulse 71, temperature 97.9 F (36.6 C), temperature source Oral, resp. rate 16,  height 5\' 3"  (1.6 m), SpO2 98 %.  Medical Problem List and Plan: 1.  Impaired mobility and ADLs secondary to left lacunar infarct             -patient may shower             -ELOS/Goals: 16-20 days S-MinA  -Continue CIR 2.  Antithrombotics: -DVT/anticoagulation:  Pharmaceutical: Lovenox- d/ced since ambulating >150 feet.              -antiplatelet therapy:DAPT 3. Pain Management:   12/29-30: pain is well controlled, continue to monitor.  4. Mood: LCSW to follow for evaluation and support.              -antipsychotic agents: N/A 5. Neuropsych: This patient is capable of making decisions on herr own behalf. 6. Skin/Wound Care: Routine pressure relief measures.  7. Fluids/Electrolytes/Nutrition: Monitor I/O. Hyponatremic to 134 on 12/21,137 on 12/27 8. HTN: BP labile- continue to monitor TID  12/23- Is soft this AM 90s/50s- asymptomatic- will monitor   Vitals:   11/09/20 0456 11/09/20 1305  BP: (!) 105/59 99/63  Pulse: 71   Resp: 16   Temp: 98.3 F (36.8 C) 97.9 F (36.6 C)  SpO2: 98%   Hypotensive 12/29-12/30- not on any HTN meds- continue to monitor.  9. Anxiety d/o: Continue buspar tid 10 H/o depression: On Zoloft with trazodone to help manage insomnia.  11. Dyslipidemia: Continue high dose atorvastatin.  12. AKI: placed nursing order to encourage hydration, Cr normalized on 12/28, continue to monitor outpatient.  13. Restless legs syndrome: continue Requip 0.25mg  HS which improved symptoms last night.  14. Urinary frequency: UA positive. Keflex started 500mg  q12H   LOS: 8 days A FACE TO FACE EVALUATION WAS PERFORMED  Reann Dobias P Belal Scallon 11/09/2020, 1:19 PM

## 2020-11-09 NOTE — Progress Notes (Signed)
Speech Language Pathology Discharge Summary  Patient Details  Name: Rebecca Sutton MRN: [id number] Date of Birth: 1960/11/27  Patient has met 2 of 2 long term goals.  Patient to discharge at overall Modified Independent level.   Reasons goals not met: N/A   Clinical Impression/Discharge Summary: Patient has made excellent gains and has met 2 of 2 LTGs this admission. Currently, patient is consuming regular textures with thin liquids with minimal overt s/s of aspiration and is overall Mod I for use of swallowing compensatory strategies. Patient is also 100% intelligible at the conversation level and is utilizing speech intelligibility strategies with Mod I.  Patient has met all LTGs at this time, therefore, she will be discharged from skilled SLP intervention with f/u not warranted at this time. Patient verbalized understanding and agreement.   Recommendation:  None      Equipment: N/A  Reasons for discharge: Treatment goals met   Patient/Family Agrees with Progress Made and Goals Achieved: Yes    [place], [place] 11/09/2020, 12:56 PM

## 2020-11-09 NOTE — Progress Notes (Signed)
Speech Language Pathology Daily Session Note  Patient Details  Name: Rebecca Sutton MRN: [id number] Date of Birth: Apr 02, 1961  Today's Date: 11/09/2020 SLP Individual Time: 1030-1056 SLP Individual Time Calculation (min): 26 min  Short Term Goals: Week 1: SLP Short Term Goal 1 (Week 1): Pt will use slow rate, inreased vocal intensity, and overarticulation to achieve intelligibility at the conversational level with supervision. SLP Short Term Goal 2 (Week 1): Pt will consume regular textures and thin liquids with mod I use of swallowing precautions and minimal overt s/s of aspiration.  Skilled Therapeutic Interventions: Pt was seen for skilled ST targeting speech intelligibility goals. SLP facilitated session with overall Supervision A verbal cues for use of slower rate and overarticulation when reading tongue twister reading passages with 90 increasing to 95% intelligibility. Pt was also 95% intelligible in informal conversation with SLP, Mod I for use of her strategies. Pt continues to express excellent motivation toward meeting all her rehab goals, and recalled information from yesterday's ST session, when she was more fatigued. Pt left sitting in recliner with alarm set and needs within reach. Continue per current plan of care.      Pain Pain Assessment Pain Scale: 0-10 Pain Score: 0-No pain   Therapy/Group: Individual Therapy  Little Ishikawa 11/09/2020, 7:27 AM

## 2020-11-09 NOTE — Progress Notes (Signed)
Physical Therapy Session Note  Patient Details  Name: Rebecca Sutton MRN: [id number] Date of Birth: 1961/02/03  Today's Date: 11/09/2020 PT Individual Time: 1430-1525 PT Individual Time Calculation (min): 55 min   Short Term Goals: Week 1:  PT Short Term Goal 1 (Week 1): Pt will perform bed to chair with CGA. PT Short Term Goal 2 (Week 1): Pt will ambulate 150' with minA and no AD PT Short Term Goal 3 (Week 1): Pt will perform 4 steps with LHR and CGA  Skilled Therapeutic Interventions/Progress Updates:     Pt greeted seated in recliner, agreeable to PT session. She reports no pain. Mother at bedside who leaves on therapist arrival. Pt reporting need to void. Therefore, sit<>stand with close supervision to RW and she ambulated to bathroom with CGA and RW, able to manage lower body dressing with supervision. She was continent of bladder, charted. W/c transport to ortho gym for time management. She completed the BERG balance test, scoring 37/56; score indicative that patient should use walker full-time. This was relayed to patient and she voiced understanding and reasoning. She then completed Nustep at workload of 5 for x67minutes and then workload of 3 for x35minutes and then workload of x1 for x37minutes, steps per minute around 20-30 however she required frequent rest breaks and decreased resistance to due to LLE "muscle tightness." Emotional support and redirection provided which appeared to help. She then ambulated >161ft with CGA and RW on level surfaces and ~32ft up/down ramp with CGA and RW. Gait deficits include R steppage pattern with decreased R heel strike, intermittent R toe drag and mild R circumdution. Cues throughout for normalizing gait pattern but no formal LOB or knee buckling/hyperextension noted. Gait speed < 0.4 m/s, indicative of household ambulator. She ended session seated in recliner with needs in reach and chair alarm on. Pt is pleased with her progress today.  Patient  demonstrates increased fall risk as noted by score of 37/56 on Berg Balance Scale.  (<36= high risk for falls, close to 100%; 37-45 significant >80%; 46-51 moderate >50%; 52-55 lower >25%)  Therapy Documentation Precautions:  Precautions Precautions: Fall Precaution Comments: R hemiparesis RUE>LE Restrictions Weight Bearing Restrictions: No  Therapy/Group: Individual Therapy  Dayne Chait P Maverick Dieudonne PT 11/09/2020, 7:58 AM

## 2020-11-09 NOTE — Progress Notes (Signed)
Occupational Therapy Session Note  Patient Details  Name: Rebecca Sutton MRN: [id number] Date of Birth: 06-Apr-1961  Today's Date: 11/09/2020 OT Individual Time: 7185-5015 OT Individual Time Calculation (min): 45 min   Short Term Goals: Week 2:  OT Short Term Goal 1 (Week 2): LTG=STG 2/2 ELOS  Skilled Therapeutic Interventions/Progress Updates:    Patient greeted seated in recliner and agreeable to OT treatment session. Pt reported need to go to the bathroom. Pt ambulated to bathroom w/ RW and CGA. Pt voided bladder and had BM. Pt completed peri-care and toileting steps with close supervision and CGA when standing to pull up pants. Worked on functional use of R UE with focus on finger isolation and increased smoothness/accuracy with tracing on BITS system. Worked on horizontal and vertical planes. Continued worked on R UE with SciFt arm bike using only R arm. Pt returned to room and ambulated back to recliner in similar fashion. Pt left seated in recliner with chair alarm on, call bell in reach, and needs met.   Therapy Documentation Precautions:  Precautions Precautions: Fall Precaution Comments: R hemiparesis RUE>LE Restrictions Weight Bearing Restrictions: No Pain: Pain Assessment Pain Scale: 0-10 Pain Score: 0-No pain   Therapy/Group: Individual Therapy  Valma Cava 11/09/2020, 12:31 PM

## 2020-11-09 NOTE — Progress Notes (Signed)
Occupational Therapy Weekly Progress Note  Patient Details  Name: Rebecca Sutton MRN: [id number] Date of Birth: [birth year]  Beginning of progress report period: November 01, 2020 End of progress report period: November 09, 2020  Today's Date: 11/09/2020 OT Individual Time: 6553-7482 OT Individual Time Calculation (min): 56 min    Patient has met 3 of 3 short term goals.  Patient is making steady progress towards OT goals. She is currently at [facility] level for BADL tasks and functional ambulation. R UE continues to improve to diminished level. Continue current POC.  Patient continues to demonstrate the following deficits: muscle weakness, impaired timing and sequencing, abnormal tone, unbalanced muscle activation, ataxia and decreased coordination and decreased standing balance, decreased postural control, hemiplegia and decreased balance strategies and therefore will continue to benefit from skilled OT intervention to enhance overall performance with BADL.  Patient progressing toward long term goals..  Continue plan of care.  OT Short Term Goals Week 1:  OT Short Term Goal 1 (Week 1): Patient will ambulate into bathroom with CGA and LRAD OT Short Term Goal 1 - Progress (Week 1): Met OT Short Term Goal 2 (Week 1): Pt will maintain standing balance with CGA OT Short Term Goal 2 - Progress (Week 1): Met OT Short Term Goal 3 (Week 1): Patient will use R UE to don deodorant OT Short Term Goal 3 - Progress (Week 1): Met Week 2:  OT Short Term Goal 1 (Week 2): LTG=STG 2/2 ELOS  Skilled Therapeutic Interventions/Progress Updates:    Patient greeted semi-reclined in bed and agreeable to OT treatment session focused on self-care retraining. Bed mobility completed with supervision, then verbal cues for hand placement with sit><stand w/ RW. Pt ambulated into bathroom w/ RW and CGA. Min A for balance when turning to sit on tub bench. Bathing completed with supervision and CGA when washing buttocks. Focus  on functional use of R UE throughout. Dressing completed from wc sit<>stand with mostly supervision, but still needed CGA or balance when pulling up pants. Continued working on standing balance/endurance with standing grooming tasks at the sink. Pt ammbulated to the recliner and left seated in recliner with chair alarm on, call bell in reach, and needs met.   Therapy Documentation Precautions:  Precautions Precautions: Fall Precaution Comments: R hemiparesis RUE>LE Restrictions Weight Bearing Restrictions: No Pain: Pain Assessment Pain Scale: 0-10 Pain Score: 1  Pain Location: Arm Pain Orientation: Left (from therapy and over use) Pain Descriptors / Indicators: Aching Pain Frequency: Rarely Pain Onset: Gradual Pain Intervention(s): Rest, repositioned  Therapy/Group: Individual Therapy  Valma Cava 11/09/2020, 8:54 AM

## 2020-11-10 ENCOUNTER — Inpatient Hospital Stay (HOSPITAL_COMMUNITY): Payer: Medicaid Other | Admitting: Occupational Therapy

## 2020-11-10 ENCOUNTER — Inpatient Hospital Stay (HOSPITAL_COMMUNITY): Payer: Medicaid Other

## 2020-11-10 ENCOUNTER — Encounter (HOSPITAL_COMMUNITY): Payer: Medicaid Other | Admitting: Psychology

## 2020-11-10 DIAGNOSIS — Z8673 Personal history of transient ischemic attack (TIA), and cerebral infarction without residual deficits: Secondary | ICD-10-CM

## 2020-11-10 MED ORDER — ROPINIROLE HCL 0.5 MG PO TABS
0.5000 mg | ORAL_TABLET | Freq: Every day | ORAL | Status: DC
  Administered 2020-11-10 – 2020-11-14 (×5): 0.5 mg via ORAL
  Filled 2020-11-10 (×6): qty 1
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
## 2020-11-10 NOTE — Progress Notes (Signed)
Physical Therapy Weekly Progress Note  Patient Details  Name: Rebecca Sutton MRN: [id number] Date of Birth: 15-Dec-1960  Beginning of progress report period: November 02, 2020 End of progress report period: November 10, 2020  Today's Date: 11/10/2020 PT Individual Time: 2620-3559 and 7416-3845 PT Individual Time Calculation (min): 44 min and 26 min  Patient has met 2 of 3 short term goals.  Pt is progressing well toward mobility goals, improving independence with bed mobility, transfers, ambulation, and stair training. Pt has improved strength and motor control of R leg, but still demonstrates deficits in R hemibody strength and motor control, impairing balance and ambulation and increasing risk for falls.  Patient continues to demonstrate the following deficits muscle weakness, decreased cardiorespiratoy endurance, decreased coordination and decreased motor planning and decreased standing balance, hemiplegia and decreased balance strategies and therefore will continue to benefit from skilled PT intervention to increase functional independence with mobility.  Patient progressing toward long term goals..  Continue plan of care.  PT Short Term Goals Week 1:  PT Short Term Goal 1 (Week 1): Pt will perform bed to chair with CGA. PT Short Term Goal 1 - Progress (Week 1): Met PT Short Term Goal 2 (Week 1): Pt will ambulate 150' with minA and no AD PT Short Term Goal 2 - Progress (Week 1): Met PT Short Term Goal 3 (Week 1): Pt will perform 4 steps with LHR and CGA PT Short Term Goal 3 - Progress (Week 1): Progressing toward goal Week 2:  PT Short Term Goal 1 (Week 2): STGs=LTGs  Skilled Therapeutic Interventions/Progress Updates:     1st Session: Pt received supine in bed and agrees to therapy. Reports soreness in L shoulder. Number not provided. PT provides rest breaks as needed to manage pain symptoms. Supine to sit mod(I) with bed features. Pt dons shoes at EOB and performs stand step transfer  to [facility] without AD and with PT verbal cues on sequencing. WC transport to gym for time management. Pt performs NMR in parallel bars. Pt performs side steps to the L and R facing mirror for visual feedback and using R upper extremity for support. PT cues for posture and body mechanics to focus strengthening on R hip abductors. x20' total with CGA. Following seated rest break, pt performs again with 3lb ankle weights on both legs. PT provides tactile cueing behind R knee to prevent snapping into extension. Pt then performs standing hip abduction 2x10 on alternating legs. PT provides multimodal cues for posture, body mechanics, and maintaining R knee stability.   Pt performs ambulatory transfer back to recliner with supervision and without AD, demonstrating improved control of R knee and no buckling or hyperextension.. Left seated in recliner with alarm intact and all needs within reach.  2nd Session: Pt received seated in recliner and agrees to therapy. No complaint of pain. Stand step transfer to [facility] with supervision and cues for positioning. WC transport to gym for time management. Pt completes [employer] weight distribution activity to provide pt with visual feedback on weight bearing through L and R legs. Pt is able to attain ~50% WB through each leg with verbal cues from PT and use of B upper extremities for support.  Pt completes x4 steps with L hand rail and CGA with PT cues on step sequencing. Pt then performs x10 step ups ascending with R leg and descending with L leg for strengthening of R leg.  Pt ambulates 150' with minA and no AD, with PT cueing for  increased heel strike at initial contact, increased R lateral weight shifting, and increased speed to decrease risk for falls.  Pt left seated in recline with alarm intact and all needs within reach.  Therapy Documentation Precautions:  Precautions Precautions: Fall Precaution Comments: R hemiparesis RUE>LE Restrictions Weight Bearing Restrictions:  No   Therapy/Group: Individual Therapy  Breck Coons, PT, DPT 11/10/2020, 4:11 PM

## 2020-11-10 NOTE — Progress Notes (Signed)
[place] PHYSICAL MEDICINE & REHABILITATION PROGRESS NOTE   Subjective/Complaints: Restless legs bothered her again last night; discussed increasing Requip to 0,5mg  and she is agreeable.   ROS: neg CP. SOB,N/V/D, + generalized muscle soreness, + restless leg syndrome  Objective:   No results found. No results for input(s): WBC, HGB, HCT, PLT in the last 72 hours. No results for input(s): NA, K, CL, CO2, GLUCOSE, BUN, CREATININE, CALCIUM in the last 72 hours.  Intake/Output Summary (Last 24 hours) at 11/10/2020 1147 Last data filed at 11/10/2020 0900 Gross per 24 hour  Intake 833 ml  Output --  Net 833 ml        Physical Exam: Vital Signs Blood pressure 97/64, pulse 77, temperature 97.9 F (36.6 C), resp. rate 16, height 5\' 3"  (1.6 m), SpO2 98 %. Physical Exam Gen: no distress, normal appearing HEENT: oral mucosa pink and moist, NCAT Cardio: Reg rate Chest: normal effort, normal rate of breathing Abd: soft, non-distended Ext: no edema Skin: bruise on right lower quadrant with palpable nodule Neuro: Pt is cognitively appropriate with normal insight, memory, and awareness. Right facial weakness with mild dysarthria. Right sided weakness. Able to follow simple motor commands without difficulty.  4- RIght delt bi, tri, grip, finger ext , 4- RIght HF, KE , 2- Right ankle DF, 3- R ankle PF Sensation intact Fine motor moderately reduce finger to thumb opposition  Musculoskeletal: Full ROM, No pain with AROM or PROM in the neck, trunk, or extremities. Posture appropriate Psych: appropriate  Assessment/Plan: 1. Functional deficits which require 3+ hours per day of interdisciplinary therapy in a comprehensive inpatient rehab setting.  Physiatrist is providing close team supervision and 24 hour management of active medical problems listed below.  Physiatrist and rehab team continue to assess barriers to discharge/monitor patient progress toward functional and medical  goals  Care Tool:  Bathing    Body parts bathed by patient: Right arm,Left arm,Chest,Front perineal area,Abdomen,Buttocks,Right upper leg,Left lower leg,Face,Left upper leg,Right lower leg         Bathing assist Assist Level: Supervision/Verbal cueing     Upper Body Dressing/Undressing Upper body dressing   What is the patient wearing?: Pull over shirt    Upper body assist Assist Level: Supervision/Verbal cueing    Lower Body Dressing/Undressing Lower body dressing      What is the patient wearing?: Underwear/pull up,Pants     Lower body assist Assist for lower body dressing: Contact Guard/Touching assist     Toileting Toileting    Toileting assist Assist for toileting: Supervision/Verbal cueing     Transfers Chair/bed transfer  Transfers assist     Chair/bed transfer assist level: Supervision/Verbal cueing     Locomotion Ambulation   Ambulation assist      Assist level: Contact Guard/Touching assist Assistive device: Walker-rolling Max distance: 11ft   Walk 10 feet activity   Assist     Assist level: Contact Guard/Touching assist Assistive device: Walker-rolling   Walk 50 feet activity   Assist    Assist level: Contact Guard/Touching assist Assistive device: Walker-rolling    Walk 150 feet activity   Assist    Assist level: Contact Guard/Touching assist Assistive device: Walker-rolling    Walk 10 feet on uneven surface  activity   Assist Walk 10 feet on uneven surfaces activity did not occur:  (ramp)   Assist level: Contact Guard/Touching assist Assistive device: Walker-rolling   Wheelchair     Assist Will patient use wheelchair at discharge?: No  Wheelchair 50 feet with 2 turns activity    Assist            Wheelchair 150 feet activity     Assist          Blood pressure 97/64, pulse 77, temperature 97.9 F (36.6 C), resp. rate 16, height 5\' 3"  (1.6 m), SpO2 98 %.  Medical  Problem List and Plan: 1.  Impaired mobility and ADLs secondary to left lacunar infarct             -patient may shower             -ELOS/Goals: 16-20 days S-MinA  -Continue CIR 2.  Antithrombotics: -DVT/anticoagulation:  Pharmaceutical: Lovenox- d/ced since ambulating >150 feet.              -antiplatelet therapy:DAPT 3. Pain Management:   12/29-31: pain is well controlled, continue to monitor.  4. Mood: LCSW to follow for evaluation and support.              -antipsychotic agents: N/A 5. Neuropsych: This patient is capable of making decisions on herr own behalf. 6. Skin/Wound Care: Routine pressure relief measures.  7. Fluids/Electrolytes/Nutrition: Monitor I/O. Hyponatremic to 134 on 12/21,137 on 12/27 8. HTN: BP labile- continue to monitor TID  12/23- Is soft this AM 90s/50s- asymptomatic- will monitor   Vitals:   11/09/20 2254 11/10/20 0441  BP: 122/68 97/64  Pulse: 79 77  Resp: 16 16  Temp: 98 F (36.7 C) 97.9 F (36.6 C)  SpO2: 100% 98%  Hypotensive 12/29-12/31- not on any HTN meds- continue to monitor.  9. Anxiety d/o: Continue buspar tid 10 H/o depression: On Zoloft with trazodone to help manage insomnia.  11. Dyslipidemia: Continue high dose atorvastatin.  12. AKI: placed nursing order to encourage hydration, Cr normalized on 12/28, continue to monitor outpatient.  13. Restless legs syndrome: increase Requip to 0.5mg  HS.  14. Urinary frequency: UA positive. Keflex started 500mg  q12H   LOS: 9 days A FACE TO FACE EVALUATION WAS PERFORMED  1/29 Petrea Fredenburg 11/10/2020, 11:47 AM

## 2020-11-10 NOTE — Progress Notes (Signed)
Occupational Therapy Session Note  Patient Details  Name: Rebecca Sutton MRN: [id number] Date of Birth: [birth year]  Today's Date: 11/10/2020  Session 1 OT Individual Time: 4035-2481 OT Individual Time Calculation (min): 72 min   Session 2 OT Individual Time: 1346-1430 OT Individual Time Calculation (min): 44 min    Short Term Goals: Week 2:  OT Short Term Goal 1 (Week 2): LTG=STG 2/2 ELOS  Skilled Therapeutic Interventions/Progress Updates:  Session 1   Patient greeted seated in recliner and agreeable to OT treatment session. Pt requesting to shower today. Pt ambulated to bathroom using RW and close supervision with 1 slight LOB requiring CGA to correct. Pt sat on commode, voided bladder and completed toileting with supervision. Pt doffed cothing seated on commode. Bathing completed sit<>stand from shower seat with focus on use of B UEs to wash hair, wring out wash cloth, and open containers. When drying off after shower, pt needed verbal cues for safety to sit down and dry feet as she tried to dry them in standing and had posterior LOB requiring OT assist to correct. Dressing completed from wc with set-up/supervision and CGA when pulling up pants. Worked on standing balance/endurance with standing grooming tasks using R hand to brush hair in standing. Continued working on functional use of R UE with fine motor bead finding activity using soft red theraputty. Worked on functional pinch strength and reaching with graded clothes pin activity. Pt returned to room and ambulated to wc without AD and CGA. Pt left seated in wc with lunch tray set-up, call bell in reach, and needs met.   Session 2 Pt greeted seated in recliner and agreeable to OT treatment session. Pt reported need to urinate. Pt ambulated without AD to the bathroom with CGA, with 1 LOB requiring min A to correct. Pt able to maintain balance while managing clothing and completing peri-care. Pt then ambulated to the door without AD, but  RW used to ambulate to therapy gym with 75% supervision, 25%CGA. Pt with 1 knee buckle on R side after ambulating hallway. NuStep on level 4 for 10 minutes. Pt focus on pushing with R UE and LEs- left L UE out 2/2 shoulder pain and to put more focus on R UE. Used BITS system in standing to work on standing balance/enduranc,e but also incorporated tracing activity using stylus with R hand. Pt ambulated back to room in similar fashion and left seated in recliner with chair alarm on, call bell in reach, and needs met.  Therapy Documentation Precautions:  Precautions Precautions: Fall Precaution Comments: R hemiparesis RUE>LE Restrictions Weight Bearing Restrictions: No Pain: Pain Assessment Pain Scale: 0-10 Pain Score: 5  Pain Type: Chronic pain Pain Location: Shoulder Pain Orientation: Left Pain Descriptors / Indicators: Aching Pain Onset: With Activity Pain Intervention(s): Repositioned   Therapy/Group: Individual Therapy  Valma Cava 11/10/2020, 2:31 PM

## 2020-11-11 DIAGNOSIS — N390 Urinary tract infection, site not specified: Secondary | ICD-10-CM

## 2020-11-11 DIAGNOSIS — F411 Generalized anxiety disorder: Secondary | ICD-10-CM

## 2020-11-11 DIAGNOSIS — G8191 Hemiplegia, unspecified affecting right dominant side: Secondary | ICD-10-CM

## 2020-11-11 DIAGNOSIS — G2581 Restless legs syndrome: Secondary | ICD-10-CM

## 2020-11-11 NOTE — Progress Notes (Signed)
[place] PHYSICAL MEDICINE & REHABILITATION PROGRESS NOTE   Subjective/Complaints: Patient seen sitting up in bed this morning.  She states she slept well overnight.  She notes ongoing improvement.  ROS: Denies CP, SOB, N/V/D  Objective:   No results found. No results for input(s): WBC, HGB, HCT, PLT in the last 72 hours. No results for input(s): NA, K, CL, CO2, GLUCOSE, BUN, CREATININE, CALCIUM in the last 72 hours.  Intake/Output Summary (Last 24 hours) at 11/11/2020 1704 Last data filed at 11/11/2020 1335 Gross per 24 hour  Intake 777 ml  Output -  Net 777 ml        Physical Exam: Vital Signs Blood pressure 109/62, pulse 83, temperature 98 F (36.7 C), temperature source Oral, resp. rate 17, height 5\' 3"  (1.6 m), SpO2 99 %. Constitutional: No distress . Vital signs reviewed. HENT: Normocephalic.  Atraumatic. Eyes: EOMI. No discharge. Cardiovascular: No JVD.  RRR. Respiratory: Normal effort.  No stridor.  Bilateral clear to auscultation. GI: Non-distended.  BS +. Skin: Warm and dry.  Intact. Psych: Normal mood.  Normal behavior. Musc: No edema in extremities.  No tenderness in extremities. Neuro: Alert Motor: RUE/RLE: 4/5 proximal distal Right facial weakness  Assessment/Plan: 1. Functional deficits which require 3+ hours per day of interdisciplinary therapy in a comprehensive inpatient rehab setting.  Physiatrist is providing close team supervision and 24 hour management of active medical problems listed below.  Physiatrist and rehab team continue to assess barriers to discharge/monitor patient progress toward functional and medical goals  Care Tool:  Bathing    Body parts bathed by patient: Right arm,Left arm,Chest,Front perineal area,Abdomen,Buttocks,Right upper leg,Left lower leg,Face,Left upper leg,Right lower leg         Bathing assist Assist Level: Supervision/Verbal cueing     Upper Body Dressing/Undressing Upper body dressing   What is the  patient wearing?: Pull over shirt    Upper body assist Assist Level: Supervision/Verbal cueing    Lower Body Dressing/Undressing Lower body dressing      What is the patient wearing?: Underwear/pull up,Pants     Lower body assist Assist for lower body dressing: Contact Guard/Touching assist     Toileting Toileting    Toileting assist Assist for toileting: Supervision/Verbal cueing     Transfers Chair/bed transfer  Transfers assist     Chair/bed transfer assist level: Supervision/Verbal cueing     Locomotion Ambulation   Ambulation assist      Assist level: Minimal Assistance - Patient > 75% Assistive device: No Device Max distance: 150'   Walk 10 feet activity   Assist     Assist level: Minimal Assistance - Patient > 75% Assistive device: No Device   Walk 50 feet activity   Assist    Assist level: Minimal Assistance - Patient > 75% Assistive device: No Device    Walk 150 feet activity   Assist    Assist level: Minimal Assistance - Patient > 75% Assistive device: No Device    Walk 10 feet on uneven surface  activity   Assist Walk 10 feet on uneven surfaces activity did not occur:  (ramp)   Assist level: Contact Guard/Touching assist Assistive device: Walker-rolling   Wheelchair     Assist Will patient use wheelchair at discharge?: No             Wheelchair 50 feet with 2 turns activity    Assist            Wheelchair 150 feet activity  Assist          Blood pressure 109/62, pulse 83, temperature 98 F (36.7 C), temperature source Oral, resp. rate 17, height 5\' 3"  (1.6 m), SpO2 99 %.  Medical Problem List and Plan: 1.   Right hemiparesis, Impaired mobility and ADLs secondary to left lacunar infarct  Continue CIR 2.  Antithrombotics: -DVT/anticoagulation:  Pharmaceutical: Lovenox- d/ced since ambulating >150 feet.              -antiplatelet therapy:DAPT 3. Pain Management:   Controlled on  1/1 4. Mood: LCSW to follow for evaluation and support.              -antipsychotic agents: N/A 5. Neuropsych: This patient is capable of making decisions on herr own behalf. 6. Skin/Wound Care: Routine pressure relief measures.  7. Fluids/Electrolytes/Nutrition: Monitor I/Os.  8. HTN:   Relatively controlled on 1/1   Vitals:   11/11/20 0502 11/11/20 1333  BP: (!) 110/58 109/62  Pulse: 72 83  Resp: 17 17  Temp: (!) 97.4 F (36.3 C) 98 F (36.7 C)  SpO2: 100% 99%  9. Anxiety d/o: Continue buspar tid  Controlled on 1/1 10 H/o depression: On Zoloft with trazodone to help manage insomnia.  11. Dyslipidemia: Continue high dose atorvastatin.  12. AKI: Resolved  Encourage hydration  Creatinine 1.0 on 12/28.  13. Restless legs syndrome: Increased Requip to 0.5mg  HS.   Improving 14.  Acute lower UTI   Urinary frequency: UA positive, urine culture ordered.   Keflex started 500mg  q12H   LOS: 10 days A FACE TO FACE EVALUATION WAS PERFORMED  Bertis Hustead 1/29 11/11/2020, 5:04 PM

## 2020-11-12 ENCOUNTER — Inpatient Hospital Stay (HOSPITAL_COMMUNITY): Payer: Medicaid Other

## 2020-11-12 NOTE — Progress Notes (Signed)
Occupational Therapy Session Note  Patient Details  Name: Rebecca Sutton MRN: [id number] Date of Birth: Nov 17, 1960  Today's Date: 11/12/2020 OT Individual Time: 1300-1345 OT Individual Time Calculation (min): 45 min    Short Term Goals: Week 2:  OT Short Term Goal 1 (Week 2): LTG=STG 2/2 ELOS  Skilled Therapeutic Interventions/Progress Updates:    Pt received supine with no c/o pain, agreeable to take shower. Pt requesting shampoo and OT left room to get this. NT assisted pt to the bathroom in the meantime. Pt completed toileting tasks, voiding urine with CGA, She used RW to transfer into the shower with CGA, min cueing for RW management. Bathing completed seated with set up assist. She preferred to use lateral leans to wash peri areas. Discussed equipment and home set up. Pt declining BSC. Pt transferred out of shower and to EOB. She donned all UB/LB clothing with supervision. Pt stated she would use a tub shower at her home after her initial d/c to her mothers house. 100 ft of functional mobility to the tub room where pt completed step in tub shower transfer with CGA and use of grab bars. Pt stating she will use a shower chair she already has, recommended pt try to sit on chair from outside tub vs stepping over and pt agreeing this is the safest idea. Pt returned to her room and was left sitting up in the recliner with all needs met. Alarm intact.   Therapy Documentation Precautions:  Precautions Precautions: Fall Precaution Comments: R hemiparesis RUE>LE Restrictions Weight Bearing Restrictions: No  Therapy/Group: Individual Therapy  Curtis Sites 11/12/2020, 7:28 AM

## 2020-11-12 NOTE — Progress Notes (Signed)
[place] PHYSICAL MEDICINE & REHABILITATION PROGRESS NOTE   Subjective/Complaints: Patient seen laying in bed this AM.  She states she slept well overnight.  She states she is doing well and denies complaints.    ROS: Denies CP, SOB, N/V/D  Objective:   No results found. No results for input(s): WBC, HGB, HCT, PLT in the last 72 hours. No results for input(s): NA, K, CL, CO2, GLUCOSE, BUN, CREATININE, CALCIUM in the last 72 hours.  Intake/Output Summary (Last 24 hours) at 11/12/2020 1140 Last data filed at 11/12/2020 0842 Gross per 24 hour  Intake 920 ml  Output -  Net 920 ml        Physical Exam: Vital Signs Blood pressure 113/67, pulse 84, temperature 97.6 F (36.4 C), resp. rate 14, height 5\' 3"  (1.6 m), SpO2 98 %. Constitutional: No distress . Vital signs reviewed. HENT: Normocephalic.  Atraumatic. Eyes: EOMI. No discharge. Cardiovascular: No JVD.  RRR. Respiratory: Normal effort.  No stridor.  Bilateral clear to auscultation. GI: Non-distended.  BS +. Skin: Warm and dry.  Intact. Psych: Normal mood.  Normal behavior. Musc: No edema in extremities.  No tenderness in extremities. Neuro: Alert Motor: RUE/RLE: 4/5 proximal distal, improving Right facial weakness, stable  Assessment/Plan: 1. Functional deficits which require 3+ hours per day of interdisciplinary therapy in a comprehensive inpatient rehab setting.  Physiatrist is providing close team supervision and 24 hour management of active medical problems listed below.  Physiatrist and rehab team continue to assess barriers to discharge/monitor patient progress toward functional and medical goals  Care Tool:  Bathing    Body parts bathed by patient: Right arm,Left arm,Chest,Front perineal area,Abdomen,Buttocks,Right upper leg,Left lower leg,Face,Left upper leg,Right lower leg         Bathing assist Assist Level: Supervision/Verbal cueing     Upper Body Dressing/Undressing Upper body dressing   What is  the patient wearing?: Pull over shirt    Upper body assist Assist Level: Supervision/Verbal cueing    Lower Body Dressing/Undressing Lower body dressing      What is the patient wearing?: Underwear/pull up,Pants     Lower body assist Assist for lower body dressing: Contact Guard/Touching assist     Toileting Toileting    Toileting assist Assist for toileting: Supervision/Verbal cueing     Transfers Chair/bed transfer  Transfers assist     Chair/bed transfer assist level: Supervision/Verbal cueing     Locomotion Ambulation   Ambulation assist      Assist level: Minimal Assistance - Patient > 75% Assistive device: No Device Max distance: 150'   Walk 10 feet activity   Assist     Assist level: Minimal Assistance - Patient > 75% Assistive device: No Device   Walk 50 feet activity   Assist    Assist level: Minimal Assistance - Patient > 75% Assistive device: No Device    Walk 150 feet activity   Assist    Assist level: Minimal Assistance - Patient > 75% Assistive device: No Device    Walk 10 feet on uneven surface  activity   Assist Walk 10 feet on uneven surfaces activity did not occur:  (ramp)   Assist level: Contact Guard/Touching assist Assistive device: Walker-rolling   Wheelchair     Assist Will patient use wheelchair at discharge?: No             Wheelchair 50 feet with 2 turns activity    Assist            Wheelchair  150 feet activity     Assist          Blood pressure 113/67, pulse 84, temperature 97.6 F (36.4 C), resp. rate 14, height 5\' 3"  (1.6 m), SpO2 98 %.  Medical Problem List and Plan: 1.   Right hemiparesis, Impaired mobility and ADLs secondary to left lacunar infarct  Continue CIR 2.  Antithrombotics: -DVT/anticoagulation:  Pharmaceutical: Lovenox- d/ced since ambulating >150 feet.              -antiplatelet therapy:DAPT 3. Pain Management:   Controlled on 1/2 4. Mood: LCSW to  follow for evaluation and support.              -antipsychotic agents: N/A 5. Neuropsych: This patient is capable of making decisions on herr own behalf. 6. Skin/Wound Care: Routine pressure relief measures.  7. Fluids/Electrolytes/Nutrition: Monitor I/Os.  8. HTN:   Relatively controlled on 1/2   Vitals:   11/12/20 0430 11/12/20 0435  BP: (!) 90/56 113/67  Pulse: 72 84  Resp: 14   Temp: 97.6 F (36.4 C)   SpO2: 98%   9. Anxiety d/o: Continue buspar tid  Controlled on 1/2 10 H/o depression: On Zoloft with trazodone to help manage insomnia.  11. Dyslipidemia: Continue high dose atorvastatin.  12. AKI: Resolved  Encourage hydration  Creatinine 1.0 on 12/28.  13. Restless legs syndrome: Increased Requip to 0.5mg  HS.   Improved 14.  Acute lower UTI   Urinary frequency: UA positive, urine culture pending.   Keflex started 500mg  q12H   LOS: 11 days A FACE TO FACE EVALUATION WAS PERFORMED  Salsabeel Gorelick 1/29 11/12/2020, 11:40 AM

## 2020-11-13 ENCOUNTER — Inpatient Hospital Stay (HOSPITAL_COMMUNITY): Payer: Medicaid Other

## 2020-11-13 ENCOUNTER — Inpatient Hospital Stay (HOSPITAL_COMMUNITY): Payer: Medicaid Other | Admitting: Occupational Therapy

## 2020-11-13 ENCOUNTER — Encounter (HOSPITAL_COMMUNITY): Payer: Medicaid Other | Admitting: Psychology

## 2020-11-13 LAB — BASIC METABOLIC PANEL
Anion gap: 12 (ref 5–15)
BUN: 19 mg/dL (ref 6–20)
CO2: 25 mmol/L (ref 22–32)
Calcium: 9.6 mg/dL (ref 8.9–10.3)
Chloride: 99 mmol/L (ref 98–111)
Creatinine, Ser: 1.04 mg/dL — ABNORMAL HIGH (ref 0.44–1.00)
GFR, Estimated: >60 mL/min (ref >60)
Glucose, Bld: 99 mg/dL (ref 70–99)
Potassium: 3.9 mmol/L (ref 3.5–5.1)
Sodium: 136 mmol/L (ref 135–145)

## 2020-11-13 LAB — URINE CULTURE: Culture: <10000 — AB

## 2020-11-13 NOTE — Progress Notes (Signed)
Physical Therapy Session Note  Patient Details  Name: Rebecca Sutton MRN: [id number] Date of Birth: 1961-01-10  Today's Date: 11/13/2020 PT Individual Time: 1330-1430 PT Individual Time Calculation (min): 60 min   Short Term Goals: Week 2:  PT Short Term Goal 1 (Week 2): STGs=LTGs  Skilled Therapeutic Interventions/Progress Updates:    Pt greeted sitting in recliner, agreeable to PT session without reports of pain. Discussed upcoming DC and general home safety training. Pt reports feeling a little bit anxious regarding discharge but is ultimately excited about leaving. Sit<>stand with close supervision from recliner to RW. She ambulated from her room to main therapy gym, >120ft, with CGA and RW. Gait deficits include R steppage with foot slap, decreased B step length, and mild forward trunk lean. Cues throughout for corrections. She then perform NMR for standing balance on blue foam airex pad while instructed on completing complex puzzle for dual-cog overlay. Completed this activity with CGA for stability but not much difficulty noted with completing the puzzles. After a brief seated rest, she then completed further NMR training on blue airex foam pad while completing card matching, using RUE for grasping and reaching focusing on dynamic reaching as well as [facility] for RUE. x1 LOB anteriorly while completing this, requiring minA for recovery. After seated rest, performed the following standing there-ex at // bars with supervision (intermittent CGA) and mirror for visual feedback: -1x20 BLE hip abduction -1x20 BLE heel raises -1x20 BLE hamstring curls *cues for muscle activation, sequencing, and technique throughout.  Pt then ambulated with CGA and RW back to her room, >118ft, similar cues as provided above. Stand>sit with supervision and RW to recliner and she remained seated in chair with chair alarm on and needs in reach.  Therapy Documentation Precautions:  Precautions Precautions:  Fall Precaution Comments: R hemiparesis RUE>LE Restrictions Weight Bearing Restrictions: No  Therapy/Group: Individual Therapy  Koron Godeaux P Ardine Iacovelli PT 11/13/2020, 1:41 PM

## 2020-11-13 NOTE — Progress Notes (Signed)
Patient ID: Rebecca Sutton, female   DOB: 1961-10-29, 60 y.o.   MRN: [id number]  Covering for primary sw, Auria.   Patient MATCH entered into system effective date 11/14/20-11/21/2020  Lavera Guise, [place] [phone]

## 2020-11-13 NOTE — Progress Notes (Signed)
Occupational Therapy Session Note  Patient Details  Name: Rebecca Sutton MRN: [id number] Date of Birth: September 21, 1961  Today's Date: 11/13/2020 OT Individual Time: 1000-1100 OT Individual Time Calculation (min): 60 min    Short Term Goals: Week 1:  OT Short Term Goal 1 (Week 1): Patient will ambulate into bathroom with CGA and LRAD OT Short Term Goal 1 - Progress (Week 1): Met OT Short Term Goal 2 (Week 1): Pt will maintain standing balance with CGA OT Short Term Goal 2 - Progress (Week 1): Met OT Short Term Goal 3 (Week 1): Patient will use R UE to don deodorant OT Short Term Goal 3 - Progress (Week 1): Met Week 2:  OT Short Term Goal 1 (Week 2): LTG=STG 2/2 ELOS  Skilled Therapeutic Interventions/Progress Updates:    Patient seated in recliner, alert and pleasant.  She denies pain at this time and is ready for therapy session.  She declines need for adl or toileting tasks at this time.  Sit to stand and ambulation with RW to/from recliner, w/c, mat table, arm chair with CGA.  She was able to walk >200 feet from room to therapy gym with increased time.  Completed NMRE with focus on posture, trunk control, balance, scapular mobility, shoulder AROM, coordination, weight bearing, weight shift, balance in both seated and standing positions.  She is able to reach across body and down to floor from standing with right hand.  Able to underhand toss objects with fair accuracy.  She returned to recliner at close of session, seat alarm set and call bell in hand.    Therapy Documentation Precautions:  Precautions Precautions: Fall Precaution Comments: R hemiparesis RUE>LE Restrictions Weight Bearing Restrictions: No   Therapy/Group: Individual Therapy  Carlos Levering 11/13/2020, 7:42 AM

## 2020-11-13 NOTE — Progress Notes (Signed)
[place] PHYSICAL MEDICINE & REHABILITATION PROGRESS NOTE   Subjective/Complaints: No new issues. Excited to see snow thru window!  ROS: Patient denies fever, rash, sore throat, blurred vision, nausea, vomiting, diarrhea, cough, shortness of breath or chest pain, joint or back pain, headache, or mood change.    Objective:   No results found. No results for input(s): WBC, HGB, HCT, PLT in the last 72 hours. Recent Labs    11/13/20 0606  NA 136  K 3.9  CL 99  CO2 25  GLUCOSE 99  BUN 19  CREATININE 1.04*  CALCIUM 9.6    Intake/Output Summary (Last 24 hours) at 11/13/2020 0907 Last data filed at 11/13/2020 0750 Gross per 24 hour  Intake 1040 ml  Output --  Net 1040 ml        Physical Exam: Vital Signs Blood pressure 122/60, pulse 72, temperature (!) 97.5 F (36.4 C), resp. rate 19, height 5\' 3"  (1.6 m), SpO2 100 %. Constitutional: No distress . Vital signs reviewed. HEENT: EOMI, oral membranes moist Neck: supple Cardiovascular: RRR without murmur. No JVD    Respiratory/Chest: CTA Bilaterally without wheezes or rales. Normal effort    GI/Abdomen: BS +, non-tender, non-distended Ext: no clubbing, cyanosis, or edema Psych: pleasant and cooperative Skin: Warm and dry.  Intact. Musc: No edema in extremities.  No tenderness in extremities. Neuro: Alert Motor: RUE/RLE: 4/5 proximal distal with decr FMC, LUE and LLE 5/5 Right facial weakness, mild dysarthria.  Assessment/Plan: 1. Functional deficits which require 3+ hours per day of interdisciplinary therapy in a comprehensive inpatient rehab setting.  Physiatrist is providing close team supervision and 24 hour management of active medical problems listed below.  Physiatrist and rehab team continue to assess barriers to discharge/monitor patient progress toward functional and medical goals  Care Tool:  Bathing    Body parts bathed by patient: Right arm,Left arm,Chest,Front perineal area,Abdomen,Buttocks,Right upper  leg,Left lower leg,Face,Left upper leg,Right lower leg         Bathing assist Assist Level: Supervision/Verbal cueing     Upper Body Dressing/Undressing Upper body dressing   What is the patient wearing?: Pull over shirt    Upper body assist Assist Level: Supervision/Verbal cueing    Lower Body Dressing/Undressing Lower body dressing      What is the patient wearing?: Underwear/pull up,Pants     Lower body assist Assist for lower body dressing: Contact Guard/Touching assist     Toileting Toileting    Toileting assist Assist for toileting: Supervision/Verbal cueing     Transfers Chair/bed transfer  Transfers assist     Chair/bed transfer assist level: Supervision/Verbal cueing     Locomotion Ambulation   Ambulation assist      Assist level: Total Assistance - Patient < 25% Assistive device: Lite Gait Max distance: 281'   Walk 10 feet activity   Assist     Assist level: Supervision/Verbal cueing Assistive device: Walker-rolling   Walk 50 feet activity   Assist    Assist level: Minimal Assistance - Patient > 75% Assistive device: No Device    Walk 150 feet activity   Assist    Assist level: Total Assistance - Patient < 25% Assistive device: Lite Gait    Walk 10 feet on uneven surface  activity   Assist Walk 10 feet on uneven surfaces activity did not occur:  (ramp)   Assist level: Contact Guard/Touching assist Assistive device: Walker-rolling   Wheelchair     Assist Will patient use wheelchair at discharge?: No  Wheelchair 50 feet with 2 turns activity    Assist            Wheelchair 150 feet activity     Assist          Blood pressure 122/60, pulse 72, temperature (!) 97.5 F (36.4 C), resp. rate 19, height 5\' 3"  (1.6 m), SpO2 100 %.  Medical Problem List and Plan: 1.   Right hemiparesis, Impaired mobility and ADLs secondary to left lacunar infarct  Continue CIR PT, OT, SLP 2.   Antithrombotics: -DVT/anticoagulation:  Pharmaceutical: Lovenox- d/ced since ambulating >150 feet.              -antiplatelet therapy:DAPT 3. Pain Management:   Controlled on 1/3 4. Mood: LCSW to follow for evaluation and support.              -antipsychotic agents: N/A 5. Neuropsych: This patient is capable of making decisions on herr own behalf. 6. Skin/Wound Care: Routine pressure relief measures.  7. Fluids/Electrolytes/Nutrition: Monitor I/Os.  8. HTN:   Relatively controlled on 1/3   Vitals:   11/12/20 1928 11/13/20 0255  BP: 122/67 122/60  Pulse: 72 72  Resp: 16 19  Temp: 98.5 F (36.9 C) (!) 97.5 F (36.4 C)  SpO2: 100% 100%  9. Anxiety d/o: Continue buspar tid  Controlled on 1/3 10 H/o depression: On Zoloft with trazodone to help manage insomnia.  11. Dyslipidemia: Continue high dose atorvastatin.  12. AKI: Resolved  Encourage hydration  Creatinine 1.0 on 12/28.  13. Restless legs syndrome: Increased Requip to 0.5mg  HS.   Improved 14.  Acute lower UTI   Urinary frequency: UA positive, urine culture with insig growth.   Has received 3+ days of keflex---d/c 1/3   LOS: 12 days A FACE TO FACE EVALUATION WAS PERFORMED  1/29 11/13/2020, 9:07 AM

## 2020-11-13 NOTE — Progress Notes (Signed)
Covering for primary sw, Auria.   Patient follow up OP referral sent to [facility].   [place], [place] [phone]

## 2020-11-13 NOTE — Progress Notes (Signed)
Patient ID: Rebecca Sutton, female   DOB: Apr 10, 1961, 60 y.o.   MRN: [id number]   Covering for primary sw, Auria.   Patient DME ordered through [employer].   [place], [place] [phone]

## 2020-11-13 NOTE — Progress Notes (Signed)
Physical Therapy Session Note  Patient Details  Name: Rebecca Sutton MRN: [id number] Date of Birth: [birth year]  Today's Date: 11/13/2020 PT Individual Time: 0806-0901 PT Individual Time Calculation (min): 55 min   Short Term Goals: Week 2:  PT Short Term Goal 1 (Week 2): STGs=LTGs  Skilled Therapeutic Interventions/Progress Updates:     Pt received supine in bed and agrees to therapy. Supine to sit mod(I) with bed features. Pt completes sit to stand and stand pivot transfer to [facility] with cues on body mechanics and positioning. WC transport to gym for time management. Pt completes gait training over treadmill with litegait system for bodyweight support. Pt performs following bouts: 2:17 at 0.5 mph for 100' 3:01 at 0.7 mph for 185' 4:00 at 0.8 mph for 281' PT wraps pt's R leg with ace wrap to promote dorsiflexion and eversion. Pt still has tendency to drag R toes occasionally and PT provides manual facilitation of R foot progression during swing phase. Multimodal cues provided for R lateral weight shift, increasing stride length, and increasing push off of R toes at terminal stance. Pt then performs marching in place in litegait, witt focus on R hip flexor activation and R lateral weight shift to allow for single leg stance to clear L foot.  WC transport back to room. Stand step transfer to recliner with RW and cues on sequencing. Left seated with alarm intact and all needs within reach.  Therapy Documentation Precautions:  Precautions Precautions: Fall Precaution Comments: R hemiparesis RUE>LE Restrictions Weight Bearing Restrictions: No   Therapy/Group: Individual Therapy  Beau Fanny, PT, DPT 11/13/2020, 9:01 AM

## 2020-11-14 ENCOUNTER — Inpatient Hospital Stay (HOSPITAL_COMMUNITY): Payer: Medicaid Other | Admitting: Occupational Therapy

## 2020-11-14 ENCOUNTER — Ambulatory Visit (HOSPITAL_COMMUNITY): Payer: Medicaid Other

## 2020-11-14 ENCOUNTER — Other Ambulatory Visit (HOSPITAL_COMMUNITY): Payer: Self-pay | Admitting: Physician Assistant

## 2020-11-14 ENCOUNTER — Encounter (HOSPITAL_COMMUNITY): Payer: Medicaid Other | Admitting: Occupational Therapy

## 2020-11-14 ENCOUNTER — Inpatient Hospital Stay (HOSPITAL_COMMUNITY): Payer: Medicaid Other

## 2020-11-14 MED ORDER — CLOPIDOGREL BISULFATE 75 MG PO TABS: 75.0000 mg | ORAL_TABLET | Freq: Every day | ORAL | 0 refills | Status: DC

## 2020-11-14 MED ORDER — ATORVASTATIN CALCIUM 80 MG PO TABS
80.0000 mg | ORAL_TABLET | Freq: Every day | ORAL | 0 refills | Status: DC
Start: 2020-11-14 — End: 2020-11-14

## 2020-11-14 MED ORDER — SERTRALINE HCL 100 MG PO TABS: 100.0000 mg | ORAL_TABLET | Freq: Every day | ORAL | 0 refills | Status: DC

## 2020-11-14 MED ORDER — ROPINIROLE HCL 0.5 MG PO TABS: 0.5000 mg | ORAL_TABLET | Freq: Every day | ORAL | 0 refills | Status: DC

## 2020-11-14 MED ORDER — BUSPIRONE HCL 10 MG PO TABS: 10.0000 mg | ORAL_TABLET | Freq: Two times a day (BID) | ORAL | 0 refills | Status: DC

## 2020-11-14 MED ORDER — PANTOPRAZOLE SODIUM 40 MG PO TBEC: 40.0000 mg | DELAYED_RELEASE_TABLET | Freq: Every day | ORAL | 0 refills | Status: DC

## 2020-11-14 MED ORDER — ASPIRIN 81 MG PO TBEC: 81.0000 mg | DELAYED_RELEASE_TABLET | Freq: Every day | ORAL | 11 refills | Status: AC

## 2020-11-14 MED ORDER — TRAZODONE HCL 150 MG PO TABS
75.0000 mg | ORAL_TABLET | Freq: Every day | ORAL | 0 refills | Status: DC
Start: 2020-11-14 — End: 2020-11-14

## 2020-11-14 MED FILL — PANTOPRAZOLE SOD DR 40 MG T: 40 | 30 days supply | Qty: 30 | Fill #0

## 2020-11-14 MED FILL — rOPINIRole HCL 0.5 MG TABS: 0.5 | 30 days supply | Qty: 30 | Fill #0

## 2020-11-14 MED FILL — traZODone HCL 150 MG TABS: 150 | 30 days supply | Qty: 15 | Fill #0

## 2020-11-14 MED FILL — CLOPIDOGREL 75 MG TABLET: 75 | 30 days supply | Qty: 30 | Fill #0

## 2020-11-14 MED FILL — ATORVASTATIN CALCIUM 80 MG: 80 | 30 days supply | Qty: 30 | Fill #0

## 2020-11-14 MED FILL — SERTRALINE HCL 100 MG TAB: 100 | 30 days supply | Qty: 30 | Fill #0

## 2020-11-14 MED FILL — busPIRone HCL 10 MG TABS: 10 | 30 days supply | Qty: 60 | Fill #0

## 2020-11-14 NOTE — Progress Notes (Signed)
[place] PHYSICAL MEDICINE & REHABILITATION PROGRESS NOTE   Subjective/Complaints: Up with PT. No new problems reported  ROS: Patient denies fever, rash, sore throat, blurred vision, nausea, vomiting, diarrhea, cough, shortness of breath or chest pain, joint or back pain, headache, or mood change.     Objective:   No results found. No results for input(s): WBC, HGB, HCT, PLT in the last 72 hours. Recent Labs    11/13/20 0606  NA 136  K 3.9  CL 99  CO2 25  GLUCOSE 99  BUN 19  CREATININE 1.04*  CALCIUM 9.6    Intake/Output Summary (Last 24 hours) at 11/14/2020 1038 Last data filed at 11/14/2020 0811 Gross per 24 hour  Intake 1000 ml  Output --  Net 1000 ml        Physical Exam: Vital Signs Blood pressure 113/67, pulse 84, temperature 98.1 F (36.7 C), temperature source Oral, resp. rate 16, height 5\' 3"  (1.6 m), SpO2 100 %. Constitutional: No distress . Vital signs reviewed. HEENT: EOMI, oral membranes moist Neck: supple Cardiovascular: RRR without murmur. No JVD    Respiratory/Chest: CTA Bilaterally without wheezes or rales. Normal effort    GI/Abdomen: BS +, non-tender, non-distended Ext: no clubbing, cyanosis, or edema Psych: pleasant and cooperative Skin: Warm and dry.  Intact. Musc: No edema in extremities.  No tenderness in extremities. Neuro: Alert Motor: RUE/RLE: 4/5 proximal distal with decr FMC, LUE and LLE 5/5 Right facial weakness, mild dysarthria. Unable to perform heel to toe gait  Assessment/Plan: 1. Functional deficits which require 3+ hours per day of interdisciplinary therapy in a comprehensive inpatient rehab setting.  Physiatrist is providing close team supervision and 24 hour management of active medical problems listed below.  Physiatrist and rehab team continue to assess barriers to discharge/monitor patient progress toward functional and medical goals  Care Tool:  Bathing    Body parts bathed by patient: Right arm,Left  arm,Chest,Front perineal area,Abdomen,Buttocks,Right upper leg,Left lower leg,Face,Left upper leg,Right lower leg         Bathing assist Assist Level: Set up assist     Upper Body Dressing/Undressing Upper body dressing   What is the patient wearing?: Pull over shirt    Upper body assist Assist Level: Independent    Lower Body Dressing/Undressing Lower body dressing      What is the patient wearing?: Underwear/pull up,Pants     Lower body assist Assist for lower body dressing: Supervision/Verbal cueing     Toileting Toileting    Toileting assist Assist for toileting: Supervision/Verbal cueing     Transfers Chair/bed transfer  Transfers assist     Chair/bed transfer assist level: Supervision/Verbal cueing     Locomotion Ambulation   Ambulation assist      Assist level: Total Assistance - Patient < 25% Assistive device: Lite Gait Max distance: 281'   Walk 10 feet activity   Assist     Assist level: Supervision/Verbal cueing Assistive device: Walker-rolling   Walk 50 feet activity   Assist    Assist level: Minimal Assistance - Patient > 75% Assistive device: No Device    Walk 150 feet activity   Assist    Assist level: Total Assistance - Patient < 25% Assistive device: Lite Gait    Walk 10 feet on uneven surface  activity   Assist Walk 10 feet on uneven surfaces activity did not occur:  (ramp)   Assist level: Contact Guard/Touching assist Assistive device: Will  patient use wheelchair at discharge?: No             Wheelchair 50 feet with 2 turns activity    Assist            Wheelchair 150 feet activity     Assist          Blood pressure 113/67, pulse 84, temperature 98.1 F (36.7 C), temperature source Oral, resp. rate 16, height 5\' 3"  (1.6 m), SpO2 100 %.  Medical Problem List and Plan: 1.   Right hemiparesis, Impaired mobility and ADLs secondary to left  lacunar infarct  Continue CIR PT, OT, SLP. ELOS 1/5  -Patient to see MD in the office for transitional care encounter in 1-2 weeks.  2.  Antithrombotics: -DVT/anticoagulation:  Pharmaceutical: Lovenox- d/ced since ambulating >150 feet.              -antiplatelet therapy:DAPT 3. Pain Management:   Controlled on 1/4 4. Mood: LCSW to follow for evaluation and support.              -antipsychotic agents: N/A 5. Neuropsych: This patient is capable of making decisions on herr own behalf. 6. Skin/Wound Care: Routine pressure relief measures.  7. Fluids/Electrolytes/Nutrition: Monitor I/Os.  8. HTN:   Relatively controlled on 1/4   Vitals:   11/13/20 1940 11/14/20 0435  BP: (!) 106/59 113/67  Pulse: 72 84  Resp: 12 16  Temp: 97.8 F (36.6 C) 98.1 F (36.7 C)  SpO2: 100%   9. Anxiety d/o: Continue buspar tid  Controlled on 1/4 10 H/o depression: On Zoloft with trazodone to help manage insomnia.  11. Dyslipidemia: Continue high dose atorvastatin.  12. AKI: Resolved  Encourage hydration  Creatinine 1.0 on 12/28.  13. Restless legs syndrome: Increased Requip to 0.5mg  HS.   Improved 14.  Acute lower UTI   Urinary frequency: UA positive, urine culture with insig growth.   Has received 3+ days of keflex---d/c'ed 1/3   LOS: 13 days A FACE TO FACE EVALUATION WAS PERFORMED  1/29 11/14/2020, 10:38 AM

## 2020-11-14 NOTE — Plan of Care (Signed)

## 2020-11-14 NOTE — Progress Notes (Signed)
Patient ID: Rebecca Sutton, female   DOB: Jan 09, 1961, 60 y.o.   MRN: [id number]  Team Conference Report to Patient/Family  Team Conference discussion was reviewed with the patient and caregiver, including goals, any changes in plan of care and target discharge date.  Patient and caregiver express understanding and are in agreement.  The patient has a target discharge date of 11/15/20.  [name] 11/14/2020, 1:04 PM

## 2020-11-14 NOTE — Progress Notes (Signed)
Physical Therapy Discharge Summary  Patient Details  Name: KAMEKO HUKILL MRN: [id number] Date of Birth: 01-Dec-1960  Today's Date: 11/14/2020 PT Individual Time: 2536-6440 and 3474-2595 PT Individual Time Calculation (min): 56 min and 41 min   Patient has met 9 of 9 long term goals due to improved activity tolerance, improved balance, improved postural control and increased strength.  Patient to discharge at an ambulatory level Supervision.   Patient's care partner is independent to provide the necessary physical assistance at discharge.  Reasons goals not met: NA  Recommendation:  Patient will benefit from ongoing skilled PT services in outpatient setting to continue to advance safe functional mobility, address ongoing impairments in R sided strength, balance, transfers, ambulation, and minimize fall risk.  Equipment: RW  Reasons for discharge: treatment goals met and discharge from hospital  Patient/family agrees with progress made and goals achieved: Yes   Skilled Therapeutic Interventions: 1st Session: Pt received supine in bed and agrees to therapy. Supine to sit independently from flat bed. Sit to stand and stand pivot transfer to [facility] with RW and verbal cues on sequencing and positioning. WC transport to gym for time management. Pt performs car transfer and ramp navigation with RW and cues on sequencing and gait pattern. Pt performs x12 steps with L hand rail and verbal cues on step sequencing and additional cues to remind pt due to pt attempting reciprocal pattern even after education. Pt ambulates 150' with RW and cues to maintain upright gaze to improve posture and balance and increasing R lateral weight shift for improved symmetrical WB. Pt performs Berg balance Assessment as detailed below. WC transport back to room. Pt performs stand step transfer to recliner without AD and with cues on positioning. Left with alarm intact and all needs within reach.  2nd Session: Pt received seated  in recliner with family present for family education. No complaint of pain. Sit to stand with cues on body mechanics. Pt ambulates x120' with RW and verbal cues for upright gaze, decreased WB through RW, and increased lateral weight shift to the R. Family educated on providing cues to improve gait deviations. Pt performs car transfer with supervision. Following, pt performs rhomberg stance, semi tandem stance, heel raises, minisquats, and repeated reps of sit to stand with arms folded across chest. All exercises intended to address balance impairments as well as strengthen bilateral lower extremities. Pt provided with HEP. Pt ambulates 150' to stairs with RW, completes x4 steps with L hand rail and cues on sequencing, then ambulates 200' back to room, taking several brief rest breaks due to fatigue. Pt left seated in WC with all needs within reach.   PT Discharge Precautions/Restrictions Restrictions Weight Bearing Restrictions: No Pain Pain Assessment Pain Scale: 0-10 Pain Score: 0-No pain Vision/Perception  Perception Perception: Within Functional Limits Praxis Praxis: Intact  Cognition Overall Cognitive Status: Within Functional Limits for tasks assessed Arousal/Alertness: Awake/alert Orientation Level: Oriented X4 Safety/Judgment: Appears intact Sensation Sensation Light Touch: Appears Intact Coordination Gross Motor Movements are Fluid and Coordinated: No Fine Motor Movements are Fluid and Coordinated: No Heel Shin Test: decreased smoothness and accuracy of R leg but improved from eval Motor  Motor Motor: Hemiplegia;Ataxia Motor - Skilled Clinical Observations: R hemiplegia Motor - Discharge Observations: Improved from Eval  Mobility Bed Mobility Bed Mobility: Sit to Supine;Supine to Sit Supine to Sit: Independent Sit to Supine: Independent Transfers Transfers: Sit to Stand;Stand to Sit;Stand Pivot Transfers Sit to Stand: Supervision/Verbal cueing Stand to Sit:  Supervision/Verbal cueing Stand  Pivot Transfers: Supervision/Verbal cueing Stand Pivot Transfer Details: Verbal cues for safe use of DME/AE;Verbal cues for sequencing Transfer (Assistive device): None Locomotion  Gait Ambulation: Yes Gait Assistance: Supervision/Verbal cueing Gait Distance (Feet): 150 Feet Assistive device: Rolling walker Gait Assistance Details: Verbal cues for technique;Verbal cues for gait pattern;Verbal cues for sequencing;Tactile cues for weight shifting Gait Gait: Yes Gait Pattern: Impaired Gait Pattern: Decreased step length - left;Decreased stance time - left;Decreased hip/knee flexion - right;Decreased dorsiflexion - right;Decreased weight shift to right Gait velocity: decreased Stairs / Additional Locomotion Stairs: Yes Stairs Assistance: Supervision/Verbal cueing Stair Management Technique: One rail Left Number of Stairs: 12 Height of Stairs: 6 Ramp: Supervision/Verbal cueing Curb: Supervision/Verbal cueing Wheelchair Mobility Wheelchair Mobility: No  Trunk/Postural Assessment  Cervical Assessment Cervical Assessment: Exceptions to [facility] (forward head) Thoracic Assessment Thoracic Assessment: Exceptions to [facility] (rounded shoulders) Lumbar Assessment Lumbar Assessment: Exceptions to [facility] (posterior pelvic tilt) Postural Control Postural Control: Deficits on evaluation Protective Responses: Slightly Delayed  Balance Balance Balance Assessed: Yes Standardized Balance Assessment Standardized Balance Assessment: Berg Balance Test Berg Balance Test Sit to Stand: Able to stand without using hands and stabilize independently Standing Unsupported: Able to stand safely 2 minutes Sitting with Back Unsupported but Feet Supported on Floor or Stool: Able to sit safely and securely 2 minutes Stand to Sit: Sits safely with minimal use of hands Transfers: Able to transfer safely, minor use of hands Standing Unsupported with Eyes Closed: Able to stand 10 seconds  with supervision Standing Ubsupported with Feet Together: Needs help to attain position but able to stand for 30 seconds with feet together From Standing, Reach Forward with Outstretched Arm: Can reach forward >12 cm safely (5") From Standing Position, Pick up Object from Floor: Able to pick up shoe, needs supervision From Standing Position, Turn to Look Behind Over each Shoulder: Looks behind from both sides and weight shifts well Turn 360 Degrees: Able to turn 360 degrees safely but slowly Standing Unsupported, Alternately Place Feet on Step/Stool: Able to stand independently and complete 8 steps >20 seconds Standing Unsupported, One Foot in Front: Able to take small step independently and hold 30 seconds Standing on One Leg: Tries to lift leg/unable to hold 3 seconds but remains standing independently Total Score: 42 Static Sitting Balance Static Sitting - Level of Assistance: 7: Independent Dynamic Sitting Balance Dynamic Sitting - Level of Assistance: 7: Independent Static Standing Balance Static Standing - Balance Support: During functional activity Static Standing - Level of Assistance: 5: Stand by assistance Dynamic Standing Balance Dynamic Standing - Balance Support: During functional activity Dynamic Standing - Level of Assistance: 5: Stand by assistance Extremity Assessment  RLE Assessment RLE Assessment: Exceptions to [facility] General Strength Comments: Grossly 4+/5 LLE Assessment LLE Assessment: Within Functional Limits General Strength Comments: Grossly 5/5    Breck Coons, PT, DPT 11/14/2020, 3:51 PM

## 2020-11-14 NOTE — Progress Notes (Signed)
Occupational Therapy Session Note  Patient Details  Name: Rebecca Sutton MRN: [id number] Date of Birth: 1961/03/22  Today's Date: 11/14/2020 OT Individual Time: 4098-1191 OT Individual Time Calculation (min): 60 min    Short Term Goals: Week 1:  OT Short Term Goal 1 (Week 1): Patient will ambulate into bathroom with CGA and LRAD OT Short Term Goal 1 - Progress (Week 1): Met OT Short Term Goal 2 (Week 1): Pt will maintain standing balance with CGA OT Short Term Goal 2 - Progress (Week 1): Met OT Short Term Goal 3 (Week 1): Patient will use R UE to don deodorant OT Short Term Goal 3 - Progress (Week 1): Met Week 2:  OT Short Term Goal 1 (Week 2): LTG=STG 2/2 ELOS     Skilled Therapeutic Interventions/Progress Updates:      Pt seen for BADL retraining of toileting, bathing, and dressing with a focus on balance, use of RW as needed and R side coordination.  Pt received in recliner ready for therapy.  Using her RW 90% of the session, pt ambulated around her room, gathered clothing, ambulated to toilet and then shower, showered (set up only), and dressed with S.  She is independent with grooming and UB self care.  She was very happy that she could tie her pants tie in a bow.   Good activity tolerance with no rest breaks.  Pt packed her bag for going home tomorrow.  Worked on RUE coordination with AROM Pilates based arm circles, clapping exercises, and finger to nose coordination.  Demonstrated with return demo of these very simple exercises pt can work on during commercial breaks when she is watching TV.   Pt resting in recliner with alarm set and all needs met.   Therapy Documentation Precautions:  Precautions Precautions: Fall Precaution Comments: R hemiparesis RUE>LE Restrictions Weight Bearing Restrictions: No       Pain: Pain Assessment Pain Scale: 0-10 Pain Score: 0-No pain ADL: ADL Eating: Independent Grooming: Independent Where Assessed-Grooming: Standing at sink Upper  Body Bathing: Independent Where Assessed-Upper Body Bathing: Shower Lower Body Bathing: Setup Where Assessed-Lower Body Bathing: Shower Upper Body Dressing: Independent Lower Body Dressing: Supervision/safety Toileting: Supervision/safety Toilet Transfer: Close supervision Tub/Shower Transfer: Close supervison   Therapy/Group: Individual Therapy  [place] 11/14/2020, 11:31 AM

## 2020-11-14 NOTE — Discharge Instructions (Signed)
Inpatient Rehab Discharge Instructions  Rebecca Sutton Discharge date and time:  11/16/19  Activities/Precautions/ Functional Status: Activity: no lifting, driving, or strenuous exercise  till cleared by MD Diet: cardiac diet Wound Care: none needed    Functional status:  ___ No restrictions     ___ Walk up steps independently _X__ 24/7 supervision/assistance   ___ Walk up steps with assistance ___ Intermittent supervision/assistance  ___ Bathe/dress independently ___ Walk with walker     _X__ Bathe/dress with assistance ___ Walk Independently    ___ Shower independently ___ Walk with assistance    ___ Shower with assistance _X__ No alcohol     ___ Return to work/school ________   Special Instructions: 1. Hospital follow up scheduled with Dr. Katrinka Blazing at [facility] on Monday, January 10th at [facility] No driving smoking or alcohol   STROKE/TIA DISCHARGE INSTRUCTIONS SMOKING Cigarette smoking nearly doubles your risk of having a stroke & is the single most alterable risk factor  If you smoke or have smoked in the last 12 months, you are advised to quit smoking for your health.  Most of the excess cardiovascular risk related to smoking disappears within a year of stopping.  Ask you doctor about anti-smoking medications  [place] Quit Line: 1-800-QUIT NOW  Free Smoking Cessation Classes [phone]  CHOLESTEROL Know your levels; limit fat & cholesterol in your diet  Lipid Panel     Component Value Date/Time   CHOL 288 (H) 10/25/2020 0941   TRIG 70 10/25/2020 0941   HDL 61 10/25/2020 0941   CHOLHDL 4.7 10/25/2020 0941   VLDL 14 10/25/2020 0941   LDLCALC 213 (H) 10/25/2020 0941      Many patients benefit from treatment even if their cholesterol is at goal.  Goal: Total Cholesterol (CHOL) less than 160  Goal:  Triglycerides (TRIG) less than 150  Goal:  HDL greater than 40  Goal:  LDL (LDLCALC) less than 100   BLOOD PRESSURE American Stroke Association blood pressure  target is less that 120/80 mm/Hg  Your discharge blood pressure is:  BP: 110/70  Monitor your blood pressure  Limit your salt and alcohol intake  Many individuals will require more than one medication for high blood pressure  DIABETES (A1c is a blood sugar average for last 3 months) Goal HGBA1c is under 7% (HBGA1c is blood sugar average for last 3 months)  Diabetes: No known diagnosis of diabetes    Lab Results  Component Value Date   HGBA1C 4.9 10/25/2020     Your HGBA1c can be lowered with medications, healthy diet, and exercise.  Check your blood sugar as directed by your physician  Call your physician if you experience unexplained or low blood sugars.  PHYSICAL ACTIVITY/REHABILITATION Goal is 30 minutes at least 4 days per week  Activity: No driving, Therapies:  Return to work: N/A  Activity decreases your risk of heart attack and stroke and makes your heart stronger.  It helps control your weight and blood pressure; helps you relax and can improve your mood.  Participate in a regular exercise program.  Talk with your doctor about the best form of exercise for you (dancing, walking, swimming, cycling).  DIET/WEIGHT Goal is to maintain a healthy weight  Your discharge diet is:  Diet Order            Diet Heart Room service appropriate? Yes; Fluid consistency: Thin  Diet effective now  liquids Your height is:  Height: 5\' 3"  (160 cm) Your current weight is: 149 lbs Your Body Mass Index (BMI) is:  26  Following the type of diet specifically designed for you will help prevent another stroke.  Your goal weight is 141 lbs  Your goal Body Mass Index (BMI) is 19-24.  Healthy food habits can help reduce 3 risk factors for stroke:  High cholesterol, hypertension, and excess weight.  RESOURCES Stroke/Support Group:  Call [phone]   STROKE EDUCATION PROVIDED/REVIEWED AND GIVEN TO PATIENT Stroke warning signs and symptoms How to activate emergency medical  system (call 911). Medications prescribed at discharge. Need for follow-up after discharge. Personal risk factors for stroke. Pneumonia vaccine given:  Flu vaccine given:  My questions have been answered, the writing is legible, and I understand these instructions.  I will adhere to these goals & educational materials that have been provided to me after my discharge from the hospital.     My questions have been answered and I understand these instructions. I will adhere to these goals and the provided educational materials after my discharge from the hospital.  Patient/Caregiver Signature _______________________________ Date __________  Clinician Signature _______________________________________ Date __________  Please bring this form and your medication list with you to all your follow-up doctor's appointments.

## 2020-11-14 NOTE — Progress Notes (Signed)
Occupational Therapy Discharge Summary  Patient Details  Name: Rebecca Sutton MRN: [id number] Date of Birth: 1961-08-27  Today's Date: 11/14/2020 OT Individual Time: 1400-1445 OT Individual Time Calculation (min): 45 min   Pt greeted seated in recliner with daughter and mom present for family education. Pt ambulated to therapy apartment and practiced walk-in shower transfer in simulated home environment. Discussed home set-up and home modifications for safe BADL participation. OT provided pt with orange theraband and UE there-ex handout. Reviewed 5 sets of each exercise seated on therapy mat. Pt ambulated back to room with RW and supervision. Pt left seated in recliner with alarm on, call bell in reach, and needs met.    Patient has met 12 of 12 long term goals due to improved activity tolerance, improved balance, postural control, ability to compensate for deficits and functional use of  RIGHT upper and RIGHT lower extremity.  Patient to discharge at overall Supervision level.  Patient's care partner is independent to provide the necessary physical assistance at discharge.    Reasons goals not met: n/a  Recommendation:  Patient will benefit from ongoing skilled OT services in outpatient setting to continue to advance functional skills in the area of BADL and functional use of R UE.  Equipment: RW  Reasons for discharge: treatment goals met and discharge from hospital  Patient/family agrees with progress made and goals achieved: Yes  OT Discharge Precautions/Restrictions  Precautions Precautions: Fall Pain   denies pain ADL ADL Eating: Independent Grooming: Independent Where Assessed-Grooming: Standing at sink Upper Body Bathing: Independent Where Assessed-Upper Body Bathing: Shower Lower Body Bathing: Setup Where Assessed-Lower Body Bathing: Shower Upper Body Dressing: Independent Lower Body Dressing: Supervision/safety Toileting: Supervision/safety Toilet Transfer: Close  supervision Tub/Shower Transfer: Close supervison Vision Baseline Vision/History: No visual deficits Patient Visual Report: No change from baseline Perception  Perception: Within Functional Limits Praxis Praxis: Intact Cognition Overall Cognitive Status: Within Functional Limits for tasks assessed Arousal/Alertness: Awake/alert Orientation Level: Oriented X4 Safety/Judgment: Appears intact Sensation Sensation Light Touch: Appears Intact Coordination Gross Motor Movements are Fluid and Coordinated: No Fine Motor Movements are Fluid and Coordinated: No Coordination and Movement Description: decreaed smoothness and accuracy with R hand- improved since eval Heel Shin Test: decreased smoothness and accuracy of R leg but improved from eval Motor  Motor Motor: Hemiplegia;Ataxia Motor - Skilled Clinical Observations: R hemiplegia Motor - Discharge Observations: Improved from Eval Mobility  Bed Mobility Bed Mobility: Sit to Supine;Supine to Sit Supine to Sit: Independent Sit to Supine: Independent Transfers Sit to Stand: Supervision/Verbal cueing Stand to Sit: Supervision/Verbal cueing  Trunk/Postural Assessment  Cervical Assessment Cervical Assessment: Exceptions to [facility] (forward head) Thoracic Assessment Thoracic Assessment: Exceptions to [facility] (rounded shoulders) Lumbar Assessment Lumbar Assessment: Exceptions to [facility] (posterior pelvic tilt) Postural Control Postural Control: Deficits on evaluation Protective Responses: Slightly Delayed  Balance Balance Balance Assessed: Yes Standardized Balance Assessment Standardized Balance Assessment: Berg Balance Test Berg Balance Test Sit to Stand: Able to stand without using hands and stabilize independently Standing Unsupported: Able to stand safely 2 minutes Sitting with Back Unsupported but Feet Supported on Floor or Stool: Able to sit safely and securely 2 minutes Stand to Sit: Sits safely with minimal use of hands Transfers:  Able to transfer safely, minor use of hands Standing Unsupported with Eyes Closed: Able to stand 10 seconds with supervision Standing Ubsupported with Feet Together: Needs help to attain position but able to stand for 30 seconds with feet together From Standing, Reach Forward with Outstretched Arm: Can  reach forward >12 cm safely (5") From Standing Position, Pick up Object from Floor: Able to pick up shoe, needs supervision From Standing Position, Turn to Look Behind Over each Shoulder: Looks behind from both sides and weight shifts well Turn 360 Degrees: Able to turn 360 degrees safely but slowly Standing Unsupported, Alternately Place Feet on Step/Stool: Able to stand independently and complete 8 steps >20 seconds Standing Unsupported, One Foot in Front: Able to take small step independently and hold 30 seconds Standing on One Leg: Tries to lift leg/unable to hold 3 seconds but remains standing independently Total Score: 42 Static Sitting Balance Static Sitting - Level of Assistance: 7: Independent Dynamic Sitting Balance Dynamic Sitting - Level of Assistance: 7: Independent Static Standing Balance Static Standing - Balance Support: During functional activity Static Standing - Level of Assistance: 5: Stand by assistance Dynamic Standing Balance Dynamic Standing - Balance Support: During functional activity Dynamic Standing - Level of Assistance: 5: Stand by assistance Extremity/Trunk Assessment RUE Assessment RUE Assessment: Exceptions to [facility] RUE Body System: Neuro Brunstrum levels for arm and hand: Arm;Hand Brunstrum level for arm: Stage IV Movement is deviating from synergy Brunstrum level for hand: Stage VI Isolated joint movements RUE Strength Right Shoulder Flexion: 3+/5 LUE Assessment LUE Assessment: Within Functional Limits   Daneen Schick Mahira Petras 11/14/2020, 3:10 PM

## 2020-11-14 NOTE — Patient Care Conference (Signed)
Inpatient RehabilitationTeam Conference and Plan of Care Update Date: 11/14/2020   Time: 10:37 AM    Patient Name: Rebecca Sutton      Medical Record Number: [id number]  Date of Birth: February 09, 1961 Sex: Female         Room/Bed: 4M11C/4M11C-01 Payor Info: Payor: MEDICAID [id number] / Plan: MEDICAID [place]-FAMILY PLANNING / Product Type: *No Product type* /    Admit Date/Time:  11/01/2020  3:10 PM  Primary Diagnosis:  Left sided lacunar infarction [facility])  Hospital Problems: Principal Problem:   Left sided lacunar infarction [facility]) Active Problems:   Sensorineural hearing loss (SNHL)   Coronary artery disease involving native coronary artery of native heart without angina pectoris   Alcohol consumption binge drinking   History of CVA (cerebrovascular accident)   Right hemiparesis (HCC)   Hyperglycemia   Stroke (HCC)   Acute lower UTI   Generalized anxiety disorder   Restless leg syndrome    Expected Discharge Date: Expected Discharge Date: 11/15/20  Team Members Present: Physician leading conference: Dr. Faith Rogue Care Coodinator Present: Chana Bode, RN, BSN, CRRN;Christina Vita Barley, BSW Nurse Present: Magdalen Spatz) [place], LPN PT Present: Malachi Pro, PT OT Present: Kearney Hard, OT SLP Present: Feliberto Gottron, SLP PPS Coordinator present : Edson Snowball, [place] Breed, SLP     Current Status/Progress Goal Weekly Team Focus  Bowel/Bladder   Continent of B/B, LBM 1/3  remain continent B/B  Assess Q Shift and PRN   Swallow/Nutrition/ Hydration             ADL's   Near goal level- CGA transfers, supervision bathing on TTB, supervision dressing  supervision goals  ADL training, RUE/RLE NMR, balance, d/c planning   Mobility   independent, suprevision transfers, gait 150' with RW and supervision, 12 steps with LHR and supervision  supervision  DC prep   Communication             Safety/Cognition/ Behavioral Observations            Pain   No Pain reported  pain<3   Assess Q shift and PRN   Skin   Incision- Groin  proper healing, no infection.  Assess Q shift and PRN     Discharge Planning:  D/C home on 11/15/2020   Team Discussion: Patient completed abx for UTI. BP managed. High level dysarthria has improved Patient on target to meet rehab goals: yes, doing well overall  *See Care Plan and progress notes for long and short-term goals.   Revisions to Treatment Plan:   Teaching Needs: Transfers, toileting, medications, etc.  Current Barriers to Discharge: Decreased caregiver support and uninsured  Possible Resolutions to Barriers: HH follow up services requested MATCH discount medication card     Medical Summary Current Status: left lacunar infarct, RLS symptoms better. bp controlled  Barriers to Discharge: Medical stability   Possible Resolutions to [employer] Focus: regular lab review, finalize medical plan for discharge   Continued Need for Acute Rehabilitation Level of Care: The patient requires daily medical management by a physician with specialized training in physical medicine and rehabilitation for the following reasons: Direction of a multidisciplinary physical rehabilitation program to maximize functional independence : Yes Medical management of patient stability for increased activity during participation in an intensive rehabilitation regime.: Yes Analysis of laboratory values and/or radiology reports with any subsequent need for medication adjustment and/or medical intervention. : Yes   I attest that I was present, lead the team conference, and concur with the assessment and plan  of the team.   Pamelia Hoit 11/14/2020, 3:19 PM

## 2020-11-14 NOTE — Progress Notes (Signed)
Patient ID: Rebecca Sutton, female   DOB: [birth year], 60 y.o.   MRN: [id number]   Patient hospital follow up scheduled with Dr. Katrinka Blazing at [facility] on Monday, January 10th at [facility], [place] [phone]

## 2020-11-14 NOTE — Progress Notes (Signed)
Patient ID: Rebecca Sutton, female   DOB: January 28, 1961, 60 y.o.   MRN: [id number]   Patient referral sent to [facility] for charity referral review. Will update patient on acceptance/denial.   Lavera Guise, [place] [phone]

## 2020-11-15 NOTE — Progress Notes (Signed)
[facility] PHYSICAL MEDICINE & REHABILITATION PROGRESS NOTE   Subjective/Complaints: No complaints this morning She is excited to go home today Discussed discharge plan with her Her family will be picking her up  ROS: Patient denies fever, rash, sore throat, blurred vision, nausea, vomiting, diarrhea, cough, shortness of breath or chest pain, joint or back pain, headache, or mood change.     Objective:   No results found. No results for input(s): WBC, HGB, HCT, PLT in the last 72 hours. Recent Labs    11/13/20 0606  NA 136  K 3.9  CL 99  CO2 25  GLUCOSE 99  BUN 19  CREATININE 1.04*  CALCIUM 9.6    Intake/Output Summary (Last 24 hours) at 11/15/2020 0946 Last data filed at 11/15/2020 0715 Gross per 24 hour  Intake 1074 ml  Output --  Net 1074 ml        Physical Exam: Vital Signs Blood pressure (!) 110/58, pulse 67, temperature 97.8 F (36.6 C), temperature source Oral, resp. rate 18, height 5\' 3"  (1.6 m), SpO2 99 %. Gen: no distress, normal appearing HEENT: oral mucosa pink and moist, NCAT Cardio: Reg rate Chest: normal effort, normal rate of breathing Abd: soft, non-distended Ext: no edema GI/Abdomen: BS +, non-tender, non-distended Ext: no clubbing, cyanosis, or edema Psych: pleasant and cooperative Skin: Warm and dry.  Intact. Musc: No edema in extremities.  No tenderness in extremities. Neuro: Alert Motor: RUE/RLE: 4/5 proximal distal with decr FMC, LUE and LLE 5/5 Right facial weakness, mild dysarthria. Unable to perform heel to toe gait  Assessment/Plan: 1. Functional deficits which require 3+ hours per day of interdisciplinary therapy in a comprehensive inpatient rehab setting.  Physiatrist is providing close team supervision and 24 hour management of active medical problems listed below.  Physiatrist and rehab team continue to assess barriers to discharge/monitor patient progress toward functional and medical goals  Care Tool:  Bathing    Body  parts bathed by patient: Right arm,Left arm,Chest,Front perineal area,Abdomen,Buttocks,Right upper leg,Left lower leg,Face,Left upper leg,Right lower leg         Bathing assist Assist Level: Set up assist     Upper Body Dressing/Undressing Upper body dressing   What is the patient wearing?: Pull over shirt    Upper body assist Assist Level: Independent    Lower Body Dressing/Undressing Lower body dressing      What is the patient wearing?: Underwear/pull up,Pants     Lower body assist Assist for lower body dressing: Supervision/Verbal cueing     Toileting Toileting    Toileting assist Assist for toileting: Supervision/Verbal cueing     Transfers Chair/bed transfer  Transfers assist     Chair/bed transfer assist level: Supervision/Verbal cueing     Locomotion Ambulation   Ambulation assist      Assist level: Supervision/Verbal cueing Assistive device: Walker-rolling Max distance: 200'   Walk 10 feet activity   Assist     Assist level: Supervision/Verbal cueing Assistive device: Walker-rolling   Walk 50 feet activity   Assist    Assist level: Supervision/Verbal cueing Assistive device: Walker-rolling    Walk 150 feet activity   Assist    Assist level: Supervision/Verbal cueing Assistive device: Walker-rolling    Walk 10 feet on uneven surface  activity   Assist Walk 10 feet on uneven surfaces activity did not occur:  (ramp)   Assist level: Supervision/Verbal cueing Assistive device: Will patient use wheelchair at discharge?: No  Wheelchair 50 feet with 2 turns activity    Assist            Wheelchair 150 feet activity     Assist          Blood pressure (!) 110/58, pulse 67, temperature 97.8 F (36.6 C), temperature source Oral, resp. rate 18, height 5\' 3"  (1.6 m), SpO2 99 %.  Medical Problem List and Plan: 1.   Right hemiparesis, Impaired  mobility and ADLs secondary to left lacunar infarct  Continue CIR PT, OT, SLP. ELOS 1/5  -Patient to see MD in the office for transitional care encounter in 1-2 weeks.  2.  Antithrombotics: -DVT/anticoagulation:  Pharmaceutical: Lovenox- d/ced since ambulating >150 feet.              -antiplatelet therapy:DAPT 3. Pain Management:   Controlled on 1/5- monitor outpatient.  4. Mood: LCSW to follow for evaluation and support.              -antipsychotic agents: N/A 5. Neuropsych: This patient is capable of making decisions on herr own behalf. 6. Skin/Wound Care: Routine pressure relief measures.  7. Fluids/Electrolytes/Nutrition: Monitor I/Os.  8. HTN:   Relatively controlled on 1/5- monitor outpatient.    Vitals:   11/14/20 1922 11/15/20 0400  BP: 115/64 (!) 110/58  Pulse: 72 67  Resp: 16 18  Temp: 98.2 F (36.8 C) 97.8 F (36.6 C)  SpO2: 100% 99%  9. Anxiety d/o: Continue buspar tid  Controlled on 1/5- monitor outpatient.  10 H/o depression: On Zoloft with trazodone to help manage insomnia.  11. Dyslipidemia: Continue high dose atorvastatin.  12. AKI: Resolved  Encourage hydration  Creatinine 1.0 on 12/28.  13. Restless legs syndrome: Increased Requip to 0.5mg  HS.   Improved 14.  Acute lower UTI   Urinary frequency: UA positive, urine culture with insig growth.   Has received 3+ days of keflex---d/c'ed 1/3   >30 minutes spent in discharge of patient including review of medications and follow-up appointments, physical examination, and in answering all patient's questions   LOS: 14 days A FACE TO FACE EVALUATION WAS PERFORMED  1/29 Yusef Lamp 11/15/2020, 9:46 AM

## 2020-11-15 NOTE — Progress Notes (Signed)
Inpatient Rehabilitation Care Coordinator Discharge Note  The overall goal for the admission was met for:   Discharge location: Yes, HOME  Length of Stay: Yes  Discharge activity level: Yes  Home/community participation: Yes  Services provided included: MD, RD, PT, OT, SLP, RN, CM, TR, Pharmacy, Neuropsych and SW  Financial Services: Private Insurance: Uninsured  Choices offered to/list presented VG:[id number]  Follow-up services arranged: NONE DUE TO NO INSURANCE (PATIENT DECLINED FOR CHARITY DUE TO [place])  Comments (or additional information): RW  Patient/Family verbalized understanding of follow-up arrangements: Yes  Individual responsible for coordination of the follow-up plan: PATIENT OR DAUGHTER [phone]  Confirmed correct DME delivered: Dyanne Iha 11/15/2020    Dyanne Iha

## 2020-11-21 NOTE — Discharge Summary (Signed)
Physician Discharge Summary  Patient ID: Rebecca Sutton MRN: [id number] DOB/AGE: [birth year] 60 y.o.  Admit date: 11/01/2020 Discharge date: 11/21/2020  Discharge Diagnoses:  Principal Problem:   Left sided lacunar infarction [facility]) Active Problems:   Sensorineural hearing loss (SNHL)   Coronary artery disease involving native coronary artery of native heart without angina pectoris   History of CVA (cerebrovascular accident)   Right hemiparesis (HCC)   Hyperglycemia   Acute lower UTI   Generalized anxiety disorder   Restless leg syndrome   Discharged Condition: stable   Significant Diagnostic Studies: N/a   Labs:  Basic Metabolic Panel: BMP Latest Ref Rng & Units 11/13/2020 11/07/2020 11/06/2020  Glucose 70 - 99 mg/dL 99 99 149(F)  BUN 6 - 20 mg/dL 19 18 02(O)  Creatinine 0.44 - 1.00 mg/dL 3.78(H) 8.85 0.27(X)  Sodium 135 - 145 mmol/L 136 136 137  Potassium 3.5 - 5.1 mmol/L 3.9 4.4 4.1  Chloride 98 - 111 mmol/L 99 102 103  CO2 22 - 32 mmol/L 25 26 25   Calcium 8.9 - 10.3 mg/dL 9.6 9.5 9.6    CBC: CBC Latest Ref Rng & Units 11/02/2020 10/31/2020 10/30/2020  WBC 4.0 - 10.5 K/uL 6.4 7.9 6.3  Hemoglobin 12.0 - 15.0 g/dL 11/01/2020 41.2 87.8  Hematocrit 36.0 - 46.0 % 35.1(L) 34.4(L) 37.4  Platelets 150 - 400 K/uL 268 278 274    CBG: No results for input(s): GLUCAP in the last 168 hours.  Brief HPI:   Rebecca Sutton is a 60 y.o. female with history of CKD, sensorineural hearing loss, binge drinking, CVA in the past was admitted 10/25/20 with right facial droop, right gaze paresis and a fall.  MRI/MRA brain showed acute infarct minimal intensity and no LVO.  CTA head/neck showed acute infarct left internal capsule, patent right ICA stent, 60% of the proximal left CCA, occlusion or high-grade stenosis proximal left PCA, occlusion proximal bilaterally VA with reconstitution at C6 level.  2D echo showed EF of 60-67 with no wall abnormality and moderately elevated plan transferred  aorta.  She underwent cerebral angiogram 12/17 revealing bilateral vertebral artery occlusion, 40 to 60% stenosis proximal left CCA, 50% stenosis SCA proximally and 75% stenosis left ICA proximally.  Intervention radiology consulted for input and felt that left carotid stent was not necessary.  Stroke was felt to be due to small vessel disease.  Stroke prevention.  Patient confused to have limitations due to right-sided weakness and balance deficits, decreased in tolerance and impaired mobility.  CIR was recommended due to functional decline.  Hospital Course: Rebecca Sutton was admitted to rehab 11/01/2020 for inpatient therapies to consist of PT, ST and OT at least three hours five days a week. Past admission physiatrist, therapy team and rehab RN have worked together to provide customized collaborative inpatient rehab.  She has been tolerating DAPT without side effects.  Follow-up CBC shows H&H as well as platelets to be stable.  Her p.o. intake has been good and he is continent of bowel and bladder. Follow-up labs showed AKI has resolved, serum creatinine is stable and electrolytes within normal limits..    Blood pressures were monitored on TID basis and has been controlled without medication.  She has had issues with insomnia due to restless leg and Requip was increased to 0.5 mg at bedtime.  She did report frequency and was noted to have positive UA therefore treated with Keflex x3 days with improvement in symptoms.  She has made steady gains during her  rehab stay and supervision is recommended for safety after discharge.  Patient was declined for charity caregiver service area and lack of insurance.  She has been educated on home exercise plan and set up with local PCP for follow-up after discharge.   Rehab course: During patient's stay in rehab weekly team conferences were held to monitor patient's progress, set goals and discuss barriers to discharge. At admission, patient required min to mod assist  with mobility and min assist with ADL tasks. She exhibited moderate pulmonary aphasia with mild dysarthria due to right oral motor weakness. She  has had improvement in activity tolerance, balance, postural control as well as ability to compensate for deficits. She has had improvement in functional use RUE  and RLE as well as improvement in awareness.    She is able to complete ADL tasks at supervision level.  She requires supervision for transfers and limited 120 feet with a wheeled walker.  She is able to climb 4 steps with left rail with supervision.  She was tolerating regular textures with minimal overt S/S of aspiration and speech was intelligible with use of speech strategies therefore speech therapy signed off on 12/30.  Family education was completed regarding all aspects of safety including patient was provided with home exercise plan to continue after discharge.  Discharge disposition: 01-Home or Self Care  Diet: Heart healthy  Special Instructions: 1.  No driving or strenuous activity till cleared by MD. 2. Follow-up with Dr. Katrinka Blazing at [facility] in [facility] on January 10th at 2 PM.   Discharge Instructions    Ambulatory referral to Neurology   Complete by: As directed    An appointment is requested in approximately 4 weeks left lacunar infarction   Ambulatory referral to Physical Medicine Rehab   Complete by: As directed    1-2 weeks TC appt   Ambulatory referral to Physical Medicine Rehab   Complete by: As directed    Moderate complexity follow-up 1 to 2 weeks left lacunar infarction     Allergies as of 11/15/2020      Reactions   Shellfish Allergy Anaphylaxis   Throat closes up      Medication List    STOP taking these medications   ibuprofen 100 MG tablet Commonly known as: ADVIL     TAKE these medications   aspirin 81 MG EC tablet Take 1 tablet (81 mg total) by mouth daily. Swallow whole. What changed:   medication strength  how much to  take  additional instructions   atorvastatin 80 MG tablet Commonly known as: LIPITOR Take 1 tablet (80 mg total) by mouth daily.   busPIRone 10 MG tablet Commonly known as: BUSPAR Take 1 tablet (10 mg total) by mouth 2 (two) times daily.   clopidogrel 75 MG tablet Commonly known as: PLAVIX Take 1 tablet (75 mg total) by mouth daily.   multivitamin with minerals Tabs tablet Take 1 tablet by mouth daily.   pantoprazole 40 MG tablet Commonly known as: PROTONIX Take 1 tablet (40 mg total) by mouth daily.   rOPINIRole 0.5 MG tablet Commonly known as: REQUIP Take 1 tablet (0.5 mg total) by mouth at bedtime.   sertraline 100 MG tablet Commonly known as: ZOLOFT Take 1 tablet (100 mg total) by mouth daily. What changed: how much to take   traZODone 150 MG tablet Commonly known as: DESYREL Take 0.5 tablets (75 mg total) by mouth at bedtime. What changed:   when to take this  reasons  to take this       Follow-up Information    [facility]. Call.   Why: for post stroke appointment Contact information: [address]     [address] [place] [address] [phone]       Julieanne Cotton, MD Follow up.   Specialties: Interventional Radiology, Radiology Why: Call for appointment Contact information: [address] [place] [address] [phone]        Horton Chin, MD Follow up.   Specialty: Physical Medicine and Rehabilitation Why: Office to call for appointment Contact information: [address] [place] [address] [phone]               Signed: Jacquelynn Cree 11/21/2020, 8:39 PM

## 2020-11-23 ENCOUNTER — Encounter: Payer: Self-pay | Attending: Physical Medicine and Rehabilitation | Admitting: Physical Medicine and Rehabilitation

## 2021-02-01 ENCOUNTER — Other Ambulatory Visit: Payer: Self-pay

## 2021-02-01 NOTE — Patient Outreach (Signed)
[facility]) Care Management  02/01/2021  TOM RAGSDALE Aug 02, 1961 [id number]   First telephone outreach attempt to obtain mRS. No answer. Left message for returned call.  Vanice Sarah [facility] Management Assistant [phone]

## 2021-02-06 ENCOUNTER — Other Ambulatory Visit: Payer: Self-pay

## 2021-02-06 NOTE — Patient Outreach (Signed)
[facility]) Care Management  02/06/2021  Rebecca Sutton [birth year] [id number]   Telephone outreach to patient to obtain mRS was successfully completed. MRS= 1  Thank you, Vanice Sarah [facility] Care Management Assistant

## 2022-03-16 ENCOUNTER — Other Ambulatory Visit: Payer: Self-pay

## 2022-03-16 ENCOUNTER — Emergency Department (HOSPITAL_COMMUNITY)
Admission: EM | Admit: 2022-03-16 | Discharge: 2022-03-16 | Disposition: A | Discharge status: Home / Self Care | Payer: Medicaid Other | Attending: Emergency Medicine | Admitting: Emergency Medicine

## 2022-03-16 ENCOUNTER — Encounter (HOSPITAL_COMMUNITY): Payer: Self-pay

## 2022-03-16 DIAGNOSIS — R55 Syncope and collapse: Secondary | ICD-10-CM | POA: Diagnosis not present

## 2022-03-16 DIAGNOSIS — Z5321 Procedure and treatment not carried out due to patient leaving prior to being seen by health care provider: Secondary | ICD-10-CM | POA: Insufficient documentation

## 2022-03-16 DIAGNOSIS — R42 Dizziness and giddiness: Secondary | ICD-10-CM | POA: Diagnosis not present

## 2022-03-16 LAB — CBC WITH DIFFERENTIAL/PLATELET
Abs Immature Granulocytes: 0.02 10*3/uL (ref 0.00–0.07)
Basophils Absolute: 0.1 10*3/uL (ref 0.0–0.1)
Basophils Relative: 1 %
Eosinophils Absolute: 0.3 10*3/uL (ref 0.0–0.5)
Eosinophils Relative: 4 %
HCT: 43.7 % (ref 36.0–46.0)
Hemoglobin: 14.8 g/dL (ref 12.0–15.0)
Immature Granulocytes: 0 %
Lymphocytes Relative: 36 %
Lymphs Abs: 2.7 10*3/uL (ref 0.7–4.0)
MCH: 30.6 pg (ref 26.0–34.0)
MCHC: 33.9 g/dL (ref 30.0–36.0)
MCV: 90.5 fL (ref 80.0–100.0)
Monocytes Absolute: 0.6 10*3/uL (ref 0.1–1.0)
Monocytes Relative: 8 %
Neutro Abs: 3.8 10*3/uL (ref 1.7–7.7)
Neutrophils Relative %: 51 %
Platelets: 309 10*3/uL (ref 150–400)
RBC: 4.83 MIL/uL (ref 3.87–5.11)
RDW: 13.1 % (ref 11.5–15.5)
WBC: 7.6 10*3/uL (ref 4.0–10.5)
nRBC: 0 % (ref 0.0–0.2)

## 2022-03-16 LAB — I-STAT BETA HCG BLOOD, ED (MC, WL, AP ONLY): I-stat hCG, quantitative: 6.6 m[IU]/mL — ABNORMAL HIGH (ref <5)

## 2022-03-16 LAB — BASIC METABOLIC PANEL
Anion gap: 14 (ref 5–15)
BUN: 12 mg/dL (ref 6–20)
CO2: 21 mmol/L — ABNORMAL LOW (ref 22–32)
Calcium: 9.8 mg/dL (ref 8.9–10.3)
Chloride: 99 mmol/L (ref 98–111)
Creatinine, Ser: 1.09 mg/dL — ABNORMAL HIGH (ref 0.44–1.00)
GFR, Estimated: 59 mL/min — ABNORMAL LOW (ref >60)
Glucose, Bld: 135 mg/dL — ABNORMAL HIGH (ref 70–99)
Potassium: 3.3 mmol/L — ABNORMAL LOW (ref 3.5–5.1)
Sodium: 134 mmol/L — ABNORMAL LOW (ref 135–145)

## 2022-03-16 LAB — CBG MONITORING, ED: Glucose-Capillary: 137 mg/dL — ABNORMAL HIGH (ref 70–99)

## 2022-03-16 NOTE — ED Notes (Signed)
Patient did not want to stay.she left ?

## 2022-03-16 NOTE — ED Triage Notes (Signed)
Pt had syncopal episode tonight  while standing in the bathroom. Pt felt dizzy before passing out. Denies hitting head. Denies chest pain or SOB. ?

## 2022-03-16 NOTE — ED Triage Notes (Signed)
Pt took 150mg  trazadone and 100mg  of gabapentin tonight before syncopal episode. Pt states she always takes that much. ?

## 2022-09-28 IMAGING — CT CT ANGIO NECK
1 of 14 series · 4 of 33 positions shown · IV contrast (omnipaque)
Comparison: MRI head 10/26/2020 and CT head 10/25/2020

CLINICAL DATA: Stroke

EXAM:
CT ANGIOGRAPHY HEAD AND NECK
TECHNIQUE: Multidetector CT imaging of the head and neck was performed using
the standard protocol during bolus administration of intravenous
contrast. Multiplanar CT image reconstructions and MIPs were
obtained to evaluate the vascular anatomy. Carotid stenosis
measurements (when applicable) are obtained utilizing NASCET
criteria, using the distal internal carotid diameter as the
denominator.
CONTRAST:  75mL OMNIPAQUE IOHEXOL 350 MG/ML SOLN

[Series 8: cta neck thins · axial · 0.40mm/px · z∈[-363,-140]mm · 4 of 931 slices shown]
[im 187/931  soft-tissue]
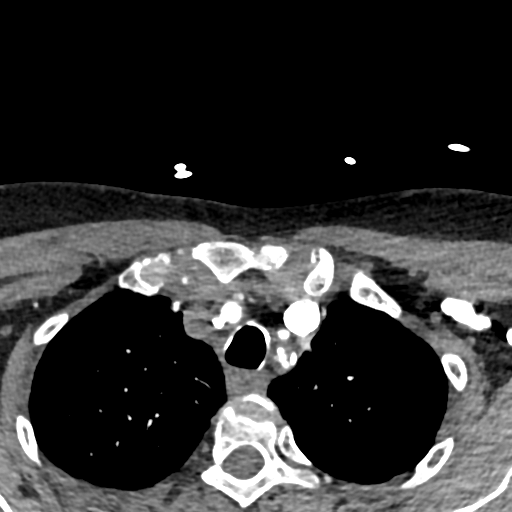
[im 373/931  bone]
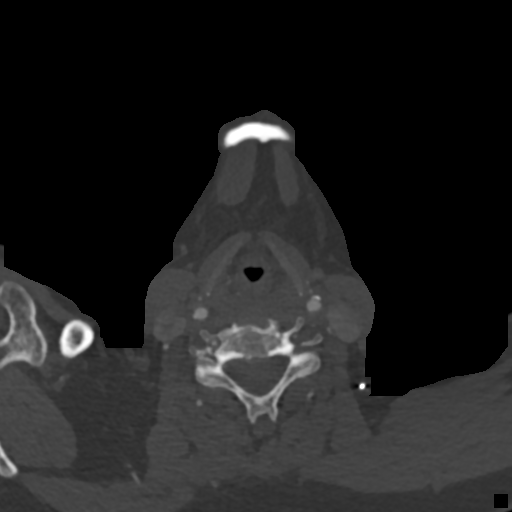
[im 559/931  soft-tissue]
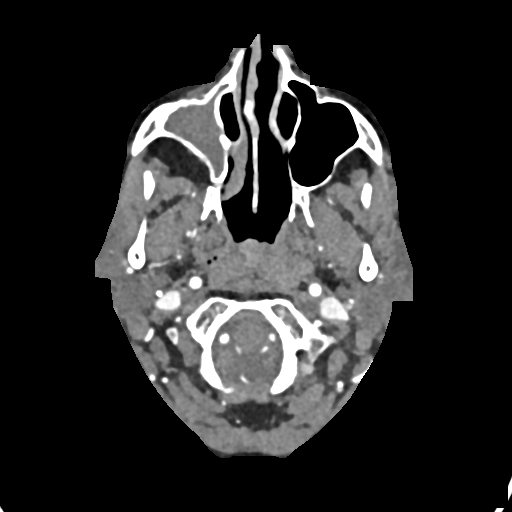
[im 745/931  bone]
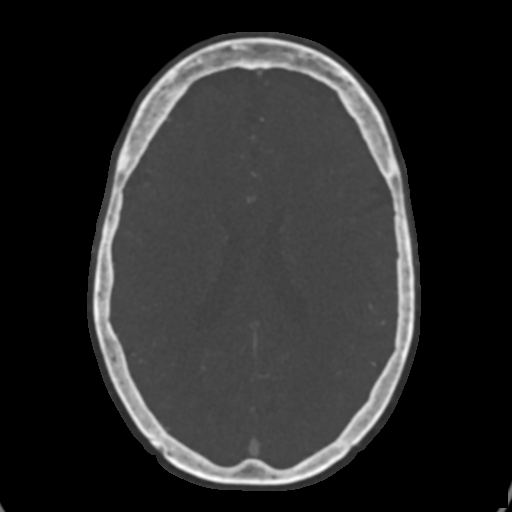

[4 of 33 positions shown; findings below may reference images not displayed]

FINDINGS: CT HEAD FINDINGS

Brain: Acute infarct posterior limb internal capsule on the left
best seen on diffusion-weighted imaging. Small hypodensity in this
area. Otherwise no acute infarct, hemorrhage, mass. Ventricle size
normal.

Vascular: Negative for hyperdense vessel

Skull: Negative

Sinuses: Complete opacification right maxillary sinus which is
contracted. Remaining sinuses clear. Mastoid clear.

Orbits: Negative

Review of the MIP images confirms the above findings

CTA NECK FINDINGS

Aortic arch: Atherosclerotic calcification aortic arch and proximal
great vessels. Diffuse atherosclerotic disease right subclavian
artery with mild stenosis. Moderate stenosis proximal left
subclavian artery and left axillary artery. 60% diameter stenosis
proximal left common carotid artery.

Right carotid system: Right carotid stenting across the bifurcation.
Stent is widely patent. Diffuse atherosclerotic calcification
throughout the right common carotid artery.

Left carotid system: Atherosclerotic disease throughout the left
common carotid artery with 60% diameter stenosis of the proximal
left common carotid artery. Atherosclerotic disease left carotid
bifurcation without significant internal carotid artery stenosis.
Severe stenosis versus occlusion of the proximal left external
carotid artery.

Vertebral arteries: Right vertebral dominant. Occlusion of the
proximal right vertebral artery with reconstitution at the C6 level
which is then continuous to the basilar.

Occlusion of the proximal left vertebral artery with reconstitution
at the C6-7 level. This vessel is diffusely diseased but patent to
the basilar.

Skeleton: No acute skeletal abnormality.  Poor dentition.

Other neck: Negative for mass or adenopathy in the neck.

Upper chest: Mild apical emphysema without acute abnormality. Apical
scarring bilaterally.

Review of the MIP images confirms the above findings

CTA HEAD FINDINGS

Anterior circulation: Mild atherosclerotic calcification in the
cavernous carotid bilaterally without stenosis. Anterior and middle
cerebral arteries patent without stenosis or large vessel occlusion.

Posterior circulation: Both vertebral arteries patent to the
basilar. PICA patent bilaterally. Basilar widely patent. AICA,
superior cerebellar, posterior cerebral arteries patent without
stenosis or large vessel occlusion.

Venous sinuses: Normal venous enhancement.

Anatomic variants: None

Review of the MIP images confirms the above findings
IMPRESSION: 1. Negative for intracranial large vessel occlusion or flow limiting
stenosis.
2. Acute infarct left internal capsule posteriorly best seen on DWI.
3. Severe atherosclerotic disease in the neck with extensive
arterial calcification diffusely.
4. Right carotid stenting patent.
5. 60% diameter stenosis proximal left common carotid artery. Left
internal carotid artery patent without significant stenosis.
Occlusion or high-grade stenosis proximal left external carotid
artery.
6. Occlusion of the proximal vertebral artery bilaterally with
reconstitution at the C6 level.

## 2022-12-23 ENCOUNTER — Other Ambulatory Visit: Payer: Self-pay

## 2022-12-23 ENCOUNTER — Ambulatory Visit (INDEPENDENT_AMBULATORY_CARE_PROVIDER_SITE_OTHER): Payer: Medicaid Other | Admitting: Internal Medicine

## 2022-12-23 ENCOUNTER — Encounter: Payer: Self-pay | Admitting: Internal Medicine

## 2022-12-23 VITALS — BP 124/76 | HR 88 | Temp 98.0°F | Resp 20 | Ht 63.5 in | Wt 155.5 lb

## 2022-12-23 DIAGNOSIS — R0609 Other forms of dyspnea: Secondary | ICD-10-CM | POA: Diagnosis not present

## 2022-12-23 DIAGNOSIS — J4489 Other specified chronic obstructive pulmonary disease: Secondary | ICD-10-CM | POA: Diagnosis not present

## 2022-12-23 DIAGNOSIS — J302 Other seasonal allergic rhinitis: Secondary | ICD-10-CM | POA: Insufficient documentation

## 2022-12-23 DIAGNOSIS — Z91013 Allergy to seafood: Secondary | ICD-10-CM | POA: Diagnosis not present

## 2022-12-23 DIAGNOSIS — T7800XA Anaphylactic reaction due to unspecified food, initial encounter: Secondary | ICD-10-CM | POA: Insufficient documentation

## 2022-12-23 DIAGNOSIS — H109 Unspecified conjunctivitis: Secondary | ICD-10-CM | POA: Insufficient documentation

## 2022-12-23 DIAGNOSIS — J3089 Other allergic rhinitis: Secondary | ICD-10-CM

## 2022-12-23 DIAGNOSIS — H1045 Other chronic allergic conjunctivitis: Secondary | ICD-10-CM

## 2022-12-23 MED ORDER — FLUTICASONE PROPIONATE 50 MCG/ACT NA SUSP: 2.0000 | Freq: Every day | NASAL | 3 refills | Status: AC

## 2022-12-23 MED ORDER — OLOPATADINE HCL 0.2 % OP SOLN: 1.0000 [drp] | Freq: Two times a day (BID) | OPHTHALMIC | 5 refills | Status: AC | PRN

## 2022-12-23 MED ORDER — EPINEPHRINE 0.3 MG/0.3ML IJ SOAJ: 0.3000 mg | INTRAMUSCULAR | 1 refills | Status: AC | PRN

## 2022-12-23 MED ORDER — FLUTICASONE-SALMETEROL 45-21 MCG/ACT IN AERO: 2.0000 | INHALATION_SPRAY | Freq: Two times a day (BID) | RESPIRATORY_TRACT | 12 refills | Status: DC

## 2022-12-23 MED ORDER — ALBUTEROL SULFATE HFA 108 (90 BASE) MCG/ACT IN AERS: 2.0000 | INHALATION_SPRAY | RESPIRATORY_TRACT | 3 refills | Status: DC | PRN

## 2022-12-23 MED ORDER — LORATADINE 10 MG PO TABS: 10.0000 mg | ORAL_TABLET | Freq: Every day | ORAL | 0 refills | Status: AC

## 2022-12-23 NOTE — Patient Instructions (Addendum)
COPD/ Asthma: not well controlled   - your lung testing today looked inflammation in your lungs that was not reversible  PLAN:  - Spacer sample and demonstration provided. - Controller Inhaler: Start Advair 85mg  2 puffs twice a day; This Should Be Used Everyday - Rinse mouth out after use - Rescue Inhaler: Albuterol (Proair/Ventolin) 2 puffs . Use  every 4-6 hours as needed for chest tightness, wheezing, or coughing.  Can also use 15 minutes prior to exercise if you have symptoms with activity. - Asthma is not controlled if:  - Symptoms are occurring >2 times a week OR  - >2 times a month nighttime awakenings  - You are requiring systemic steroids (prednisone/steroid injections) more than once per year  - Your require hospitalization for your asthma.  - Please call the clinic to schedule a follow up if these symptoms arise  Chronic Rhinitis Seasonal and Perennial Allergic: not well controlled  - allergy testing today: positive grass pollen, weed pollen, mold, dust mite, cat  - Prevention:  - allergen avoidance when possible - consider allergy shots as long term control of your symptoms by teaching your immune system to be more tolerant of your allergy triggers  - Symptom control: - Start Nasal Steroid Spray: Best results if used daily. - Options include Flonase (fluticasone), Nasocort (triamcinolone), Nasonex (mometasome) 1- 2 sprays in each nostril daily.  - All can be purchased over-the-counter if not covered by insurance. - Start sinus rinses daily 10-15 minute prior to your nose sprays  - Continue Antihistamine: daily or daily as needed.   -Options include Zyrtec (Cetirizine) 146m Claritin (Loratadine) 1019mAllegra (Fexofenadine) 180m67mr Xyzal (Levocetirinze) 5mg 12man be purchased over-the-counter if not covered by insurance.  Allergic Conjunctivitis:  - Start Allergy Eye drops-great options include Pataday (Olopatadine) or Zaditor (ketotifen) for eye symptoms daily as  needed-both sold over the counter if not covered by insurance. and Rewetting Drops such as Systane,TheraTears, etc  -Avoid eye drops that say red eye relief as they may contain medications that dry out your eyes.  Food allergy:  - today's skin testing was negative fish and shellfish - please strictly avoid all seafood until we confirm with blood work - for SKIN only reaction, okay to take Benadryl 1 capsules every 4 hours - for SKIN + ANY additional symptoms, OR IF concern for LIFE THREATENING reaction = Epipen Autoinjector EpiPen 0.3 mg. - If using Epinephrine autoinjector, call 911 - A food allergy action plan has been provided and discussed. - Medic Alert identification is recommended.  Follow up: 4 weeks   Thank you so much for letting me partake in your care today.  Don't hesitate to reach out if you have any additional concerns!  [facility] , [place]  Reducing Pollen Exposure  The American Academy of Allergy, Asthma and Immunology suggests the following steps to reduce your exposure to pollen during allergy seasons.    Do not hang sheets or clothing out to dry; pollen may collect on these items. Do not mow lawns or spend time around freshly cut grass; mowing stirs up pollen. Keep windows closed at night.  Keep car windows closed while driving. Minimize morning activities outdoors, a time when pollen counts are usually at their highest. Stay indoors as much as possible when pollen counts or humidity is high and on windy days when pollen tends to remain in the air longer. Use air conditioning when possible.  Many air  conditioners have filters that trap the pollen spores. Use a HEPA room air filter to remove pollen form the indoor air you breathe.  DUST MITE AVOIDANCE MEASURES:  There are three main measures that need and can be taken to avoid house dust mites:  Reduce accumulation of dust in general -reduce furniture, clothing,  carpeting, books, stuffed animals, especially in bedroom  Separate yourself from the dust -use pillow and mattress encasements (can be found at stores such as [employer] or online) -avoid direct exposure to air condition flow -use a HEPA filter device, especially in the bedroom; you can also use a HEPA filter vacuum cleaner -wipe dust with a moist towel instead of a dry towel or broom when cleaning  Decrease mites and/or their secretions -wash clothing and linen and stuffed animals at highest temperature possible, at least every 2 weeks -stuffed animals can also be placed in a bag and put in a freezer overnight  Despite the above measures, it is impossible to eliminate dust mites or their allergen completely from your home.  With the above measures the burden of mites in your home can be diminished, with the goal of minimizing your allergic symptoms.  Success will be reached only when implementing and using all means together.  Control of Dog or Cat Allergen  Avoidance is the best way to manage a dog or cat allergy. If you have a dog or cat and are allergic to dog or cats, consider removing the dog or cat from the home. If you have a dog or cat but don't want to find it a new home, or if your family wants a pet even though someone in the household is allergic, here are some strategies that may help keep symptoms at bay:  Keep the pet out of your bedroom and restrict it to only a few rooms. Be advised that keeping the dog or cat in only one room will not limit the allergens to that room. Don't pet, hug or kiss the dog or cat; if you do, wash your hands with soap and water. High-efficiency particulate air (HEPA) cleaners run continuously in a bedroom or living room can reduce allergen levels over time. Regular use of a high-efficiency vacuum cleaner or a central vacuum can reduce allergen levels. Giving your dog or cat a bath at least once a week can reduce airborne  allergen.  Control of Mold Allergen   Mold and fungi can grow on a variety of surfaces provided certain temperature and moisture conditions exist.  Outdoor molds grow on plants, decaying vegetation and soil.  The major outdoor mold, Alternaria and Cladosporium, are found in very high numbers during hot and dry conditions.  Generally, a late Summer - Fall peak is seen for common outdoor fungal spores.  Rain will temporarily lower outdoor mold spore count, but counts rise rapidly when the rainy period ends.  The most important indoor molds are Aspergillus and Penicillium.  Dark, humid and poorly ventilated basements are ideal sites for mold growth.  The next most common sites of mold growth are the bathroom and the kitchen.  Outdoor (Seasonal) Mold Control  Positive outdoor molds via skin testing: Alternaria, Cladosporium, Bipolaris (Helminthsporium), Drechslera (Curvalaria), and Mucor  Use air conditioning and keep windows closed Avoid exposure to decaying vegetation. Avoid leaf raking. Avoid grain handling. Consider wearing a face mask if working in moldy areas.    Indoor (Perennial) Mold Control   Positive indoor molds via skin testing: Aspergillus, Penicillium,  Fusarium, Aureobasidium (Pullulara), and Rhizopus  Maintain humidity below 50%. Clean washable surfaces with 5% bleach solution. Remove sources e.g. contaminated carpets.

## 2022-12-23 NOTE — Progress Notes (Signed)
New Patient Note  RE: Rebecca Sutton MRN: [id number] DOB: [birth year] Date of Office Visit: 12/23/2022  Consult requested by: Rebecca Sutton* Primary care provider: Wynn Banker, PA  Chief Complaint: Allergy Testing and Establish Care  History of Present Illness: I had the pleasure of seeing Rebecca Sutton for initial evaluation at the [name] on 12/23/2022. She is a 62 y.o. female with a PMHX of CVA, COPD who is referred here by Rebecca Banker, PA for the evaluation of rhinitis, dyspnea.  History obtained from patient, chart review and  daughter .  COPD/Asthma History:  -Diagnosed at age around a child .  -Current symptoms include cough and shortness of breath Twice a week daytime symptoms in past month, 0 nighttime awakenings in past month Using rescue inhaler (using daughters albuterol inhalers once a month) -Limitations to daily activity: mild - 0 ED visits, 0 UC visits and 0 oral steroids in the past year - 0 number of lifetime hospitalizations, 0 number of lifetime intubations.  - Identified Triggers: exercise, respiratory illness, smoke exposure, and cold air - Up-to-date with Covid-19, and Flu, vaccines. - History of prior pneumonias: one around age 30 - History of prior COVID-19 infection: denies  - Smoking exposure: smokes 1 ppd  Previous Diagnostics:  - Prior PFTs or spirometry: none to review - Most Recent AEC (03/16/22): 300 -Most Recent Chest Imaging: none  Management:  - Previously used therapies: none .  - Current regimen:  - Maintenance: none  - Rescue: rare use, does not have a prescription    Chronic rhinitis: started as a young child  Symptoms include: nasal congestion, rhinorrhea, post nasal drainage, sneezing, watery eyes, itchy eyes, and itchy nose  Occurs year-round with seasonal flares spring time  Potential triggers: cats, and seasonal changes  Treatments tried: Xyzal 5 mg daily, flonase (inadequate  technique) Previous allergy testing: yes History of reflux/heartburn:  Yes on famotidine 40 mg daily, pantoprazole 40 mg daily History of chronic sinusitis or sinus surgery: no Nonallergic triggers: smoke   Concern for Food Allergy:  Food of concern: shellfish, white fishing History of reaction: throat closing, loss of conscious, treated in the hospital  Previous allergy testing no Eats egg, dairy, wheat, soy,   peanuts, tree nuts, sesame without reactions Carries an epinephrine autoinjector: no Has food allergy action plan no    Assessment and Plan: [name] is a 62 y.o. female with: Asthma-COPD overlap syndrome - Plan: Interdermal Allergy Test  Allergy with anaphylaxis due to food - Plan: Allergy Panel 19, [employer], Allergy Test  Seasonal and perennial allergic rhinitis  Other chronic allergic conjunctivitis of both eyes   Plan: Patient Instructions  COPD/ Asthma: not well controlled   - your lung testing today looked inflammation in your lungs that was not reversible  PLAN:  - Spacer sample and demonstration provided. - Controller Inhaler: Start Advair 67mg  2 puffs twice a day; This Should Be Used Everyday - Rinse mouth out after use - Rescue Inhaler: Albuterol (Proair/Ventolin) 2 puffs . Use  every 4-6 hours as needed for chest tightness, wheezing, or coughing.  Can also use 15 minutes prior to exercise if you have symptoms with activity. - Asthma is not controlled if:  - Symptoms are occurring >2 times a week OR  - >2 times a month nighttime awakenings  - You are requiring systemic steroids (prednisone/steroid injections) more than once per year  - Your require hospitalization for your asthma.  -  Please call the clinic to schedule a follow up if these symptoms arise  Chronic Rhinitis Seasonal and Perennial Allergic: not well controlled  - allergy testing today: positive grass pollen, weed pollen, mold, dust mite, cat  - Prevention:  - allergen avoidance when  possible - consider allergy shots as long term control of your symptoms by teaching your immune system to be more tolerant of your allergy triggers  - Symptom control: - Start Nasal Steroid Spray: Best results if used daily. - Options include Flonase (fluticasone), Nasocort (triamcinolone), Nasonex (mometasome) 1- 2 sprays in each nostril daily.  - All can be purchased over-the-counter if not covered by insurance. - Start sinus rinses daily 10-15 minute prior to your nose sprays  - Continue Antihistamine: daily or daily as needed.   -Options include Zyrtec (Cetirizine) 25m, Claritin (Loratadine) 119m Allegra (Fexofenadine) [id number] Xyzal (Levocetirinze) 5mg5mCan be purchased over-the-counter if not covered by insurance.  Allergic Conjunctivitis:  - Start Allergy Eye drops-great options include Pataday (Olopatadine) or Zaditor (ketotifen) for eye symptoms daily as needed-both sold over the counter if not covered by insurance. and Rewetting Drops such as Systane,TheraTears, etc  -Avoid eye drops that say red eye relief as they may contain medications that dry out your eyes.  Food allergy:  - today's skin testing was negative fish and shellfish - please strictly avoid all seafood until we confirm with blood work - for SKIN only reaction, okay to take Benadryl 1 capsules every 4 hours - for SKIN + ANY additional symptoms, OR IF concern for LIFE THREATENING reaction = Epipen Autoinjector EpiPen 0.3 mg. - If using Epinephrine autoinjector, call 911 - A food allergy action plan has been provided and discussed. - Medic Alert identification is recommended.  Follow up: 4 weeks   Thank you so much for letting me partake in your care today.  Don't hesitate to reach out if you have any additional concerns!  Sutton Rebecca  [facility] [place] [place]  Reducing Pollen Exposure  The American Academy of Allergy, Asthma and Immunology suggests the following steps to reduce  your exposure to pollen during allergy seasons.    Do not hang sheets or clothing out to dry; pollen may collect on these items. Do not mow lawns or spend time around freshly cut grass; mowing stirs up pollen. Keep windows closed at night.  Keep car windows closed while driving. Minimize morning activities outdoors, a time when pollen counts are usually at their highest. Stay indoors as much as possible when pollen counts or humidity is high and on windy days when pollen tends to remain in the air longer. Use air conditioning when possible.  Many air conditioners have filters that trap the pollen spores. Use a HEPA room air filter to remove pollen form the indoor air you breathe.  DUST MITE AVOIDANCE MEASURES:  There are three main measures that need and can be taken to avoid house dust mites:  Reduce accumulation of dust in general -reduce furniture, clothing, carpeting, books, stuffed animals, especially in bedroom  Separate yourself from the dust -use pillow and mattress encasements (can be found at stores such as [employer] or online) -avoid direct exposure to air condition flow -use a HEPA filter device, especially in the bedroom; you can also use a HEPA filter vacuum cleaner -wipe dust with a moist towel instead of a dry towel or broom when cleaning  Decrease mites and/or their secretions -wash clothing and linen  and stuffed animals at highest temperature possible, at least every 2 weeks -stuffed animals can also be placed in a bag and put in a freezer overnight  Despite the above measures, it is impossible to eliminate dust mites or their allergen completely from your home.  With the above measures the burden of mites in your home can be diminished, with the goal of minimizing your allergic symptoms.  Success will be reached only when implementing and using all means together.  Control of Dog or Cat Allergen  Avoidance is the best way to manage a dog or cat allergy.  If you have a dog or cat and are allergic to dog or cats, consider removing the dog or cat from the home. If you have a dog or cat but don't want to find it a new home, or if your family wants a pet even though someone in the household is allergic, here are some strategies that may help keep symptoms at bay:  Keep the pet out of your bedroom and restrict it to only a few rooms. Be advised that keeping the dog or cat in only one room will not limit the allergens to that room. Don't pet, hug or kiss the dog or cat; if you do, wash your hands with soap and water. High-efficiency particulate air (HEPA) cleaners run continuously in a bedroom or living room can reduce allergen levels over time. Regular use of a high-efficiency vacuum cleaner or a central vacuum can reduce allergen levels. Giving your dog or cat a bath at least once a week can reduce airborne allergen.  Control of Mold Allergen   Mold and fungi can grow on a variety of surfaces provided certain temperature and moisture conditions exist.  Outdoor molds grow on plants, decaying vegetation and soil.  The major outdoor mold, Alternaria and Cladosporium, are found in very high numbers during hot and dry conditions.  Generally, a late Summer - Fall peak is seen for common outdoor fungal spores.  Rain will temporarily lower outdoor mold spore count, but counts rise rapidly when the rainy period ends.  The most important indoor molds are Aspergillus and Penicillium.  Dark, humid and poorly ventilated basements are ideal sites for mold growth.  The next most common sites of mold growth are the bathroom and the kitchen.  Outdoor (Seasonal) Mold Control  Positive outdoor molds via skin testing: Alternaria, Cladosporium, Bipolaris (Helminthsporium), Drechslera (Curvalaria), and Mucor  Use air conditioning and keep windows closed Avoid exposure to decaying vegetation. Avoid leaf raking. Avoid grain handling. Consider wearing a face mask if working  in moldy areas.    Indoor (Perennial) Mold Control   Positive indoor molds via skin testing: Aspergillus, Penicillium, Fusarium, Aureobasidium (Pullulara), and Rhizopus  Maintain humidity below 50%. Clean washable surfaces with 5% bleach solution. Remove sources e.g. contaminated carpets.      Meds ordered this encounter  Medications   albuterol (VENTOLIN HFA) 108 (90 Base) MCG/ACT inhaler    Sig: Inhale 2 puffs into the lungs every 4 (four) hours as needed for wheezing or shortness of breath.    Dispense:  18 g    Refill:  3   fluticasone-salmeterol (ADVAIR HFA) 45-21 MCG/ACT inhaler    Sig: Inhale 2 puffs into the lungs 2 (two) times daily.    Dispense:  1 each    Refill:  12   fluticasone (FLONASE) 50 MCG/ACT nasal spray    Sig: Place 2 sprays into both nostrils daily.    Dispense:  16 g    Refill:  3   Olopatadine HCl (PATADAY) 0.2 % SOLN    Sig: Place 1 drop into both eyes 2 (two) times daily as needed.    Dispense:  2.5 mL    Refill:  5    Run as RX NDC   EPINEPHrine 0.3 mg/0.3 mL IJ SOAJ injection    Sig: Inject 0.3 mg into the muscle as needed.    Dispense:  1 each    Refill:  1   Lab Orders         Allergy Panel 19, [employer]      Other allergy screening: Asthma: yes Rhino conjunctivitis: yes Food allergy: yes Medication allergy: no Hymenoptera allergy: no Urticaria: no Eczema:no History of recurrent infections suggestive of immunodeficency: no  Diagnostics: Spirometry:  Tracings reviewed. Her effort: Good reproducible efforts. FVC: 1.84 L FEV1: 1.33 L, 55% predicted FEV1/FVC ratio: 72% Interpretation: Spirometry consistent with mixed obstructive and restrictive disease after 4 puffs of albuterol FEV1 increased by 3% and 40cc, FVC increased by 2% and 30 cc. This is not a significant post bronchodilator response  Please see scanned spirometry results for details.  Skin Testing: Environmental allergy panel and select foods.  Epicutaneous  Testing: positive to dust mite   Intradermal Testing:  positive to grass, weeds, cat, mold mix 1,2,3,4  adequate controls  Results interpreted by myself and discussed with patient/family.  Airborne Adult Perc - 12/23/22 1101     Time Antigen Placed 1030    Allergen Manufacturer Lavella Hammock    Location Back    Number of Test 58    1. Control-Buffer 50% Glycerol Negative    2. Control-Histamine 1 mg/ml 4+    3. Albumin saline Negative    4. [place] Negative    5. [place] Negative    6. Johnson Negative    7. [place] Blue Negative    9. Perennial Rye Negative    10. Sweet Vernal Negative    11. Timothy Negative    12. Cocklebur Negative    13. Burweed Marshelder Negative    14. Ragweed, short Negative    15. Ragweed, Giant Negative    16. Plantain,  English Negative    17. Lamb's Quarters Negative    18. Sheep Sorrell Negative    19. Rough Pigweed Negative    20. Marsh Elder, Rough Negative    21. Mugwort, Common Negative    22. Ash mix Negative    23. Birch mix Negative    24. Beech American Negative    25. Box, Elder Negative    26. Cedar, red Negative    27. Cottonwood, [place] Negative    28. Elm mix Negative    29. Hickory Negative    30. Maple mix Negative    31. Oak, [place] mix Negative    32. Pecan Pollen Negative    33. Pine mix Negative    34. Sycamore Eastern Negative    35. [place], Black Pollen Negative    36. Alternaria alternata Negative    37. Cladosporium Herbarum Negative    38. Aspergillus mix Negative    39. Penicillium mix Negative    40. Bipolaris sorokiniana (Helminthosporium) Negative    41. Drechslera spicifera (Curvularia) Negative    42. Mucor plumbeus Negative    43. Fusarium moniliforme Negative    44. Aureobasidium pullulans (pullulara) Negative    45. Rhizopus oryzae Negative    46. Botrytis cinera Negative    47. Epicoccum nigrum  Negative    48. Phoma betae Negative    49. Candida Albicans Negative    50. Trichophyton mentagrophytes  Negative    51. Mite, D Farinae  5,000 AU/ml 4+    52. Mite, D Pteronyssinus  5,000 AU/ml 4+    53. Cat Hair 10,000 BAU/ml Negative    54.  Dog Epithelia Negative    55. Mixed Feathers Negative    56. Horse Epithelia Negative    57. Cockroach, German Negative    58. Mouse Negative    59. Tobacco Leaf Negative             Food Perc - 12/23/22 1102       Test Information   Time Antigen Placed 1030    Allergen Manufacturer Lavella Hammock    Location Back    Number of allergen test [place]   1. Peanut Negative    2. Soybean food Negative    3. Wheat, whole Negative    4. Sesame Negative    5. Milk, cow Negative    6. Egg White, chicken Negative    7. Casein Negative    8. Shellfish mix Negative    9. Fish mix Negative    10. Cashew Negative             Intradermal - 12/23/22 1131     Time Antigen Placed 1120    Location Arm    Number of Test 14    Intradermal Select    Control 4+    [place] 4+    Johnson 4+    7 Grass 3+    Ragweed mix 3+    Weed mix 3+    Tree mix Negative    Mold 1 2+    Mold 2 2+    Mold 3 2+    Mold 4 2+    Cat 4+    Dog Negative    Cockroach Negative             Past Medical History: Patient Active Problem List   Diagnosis Date Noted   Conjunctivitis 12/23/2022   Seasonal and perennial allergic rhinitis 12/23/2022   Asthma-COPD overlap syndrome 12/23/2022   Allergy with anaphylaxis due to food 12/23/2022   Acute lower UTI    Generalized anxiety disorder    Restless leg syndrome    Sensorineural hearing loss (SNHL)    Coronary artery disease involving native coronary artery of native heart without angina pectoris    History of CVA (cerebrovascular accident)    Right hemiparesis ([place])    Hyperglycemia    Left sided lacunar infarction ([place]) 10/25/2020   Past Medical History:  Diagnosis Date   CAD (coronary artery disease)    Carotid stenosis    bilateral   CVA (cerebral vascular accident) ([place]) 2019    H/O ETOH abuse 08/2020   Smoking trying to quit    1/2 PPD   Substance abuse ([place])    last used 20 years ago   Past Surgical History: Past Surgical History:  Procedure Laterality Date   IR ANGIO INTRA EXTRACRAN SEL COM CAROTID INNOMINATE BILAT MOD SED  10/27/2020   IR ANGIO VERTEBRAL SEL SUBCLAVIAN INNOMINATE BILAT MOD SED  10/27/2020   Medication List:  Current Outpatient Medications  Medication Sig Dispense Refill   albuterol (VENTOLIN HFA) 108 (90 Base) MCG/ACT inhaler Inhale 2 puffs into the lungs every 4 (four) hours as needed for wheezing or  shortness of breath. 18 g 3   aspirin EC 81 MG EC tablet Take 1 tablet (81 mg total) by mouth daily. Swallow whole. 30 tablet 11   busPIRone (BUSPAR) 10 MG tablet Take by mouth.     chlorhexidine (PERIDEX) 0.12 % solution chlorhexidine gluconate 0.12 % mouthwash  RINSE MOUTH WITH 15 ML (ONE CAPFUL) FOR 30 SECONDS IN THE MORNING AND EVENING AFTER TOOTHBRUSHING. SPIT OUT AFTER RINSING, DO NOT SWALLOW     clopidogrel (PLAVIX) 75 MG tablet clopidogrel 75 mg tablet  TAKE 1 TABLET (75 MG TOTAL) BY MOUTH DAILY.     docusate sodium (COLACE) 100 MG capsule Colace 100 mg capsule  Take 1 capsule every day by oral route.     EPINEPHrine 0.3 mg/0.3 mL IJ SOAJ injection Inject 0.3 mg into the muscle as needed. 1 each 1   famotidine (PEPCID) 40 MG tablet Take by mouth.     fluticasone (FLONASE) 50 MCG/ACT nasal spray Place 2 sprays into both nostrils daily. 16 g 3   fluticasone-salmeterol (ADVAIR HFA) 45-21 MCG/ACT inhaler Inhale 2 puffs into the lungs 2 (two) times daily. 1 each 12   gabapentin (NEURONTIN) 100 MG capsule take 1 capsule by mouth every twice each day.     ibuprofen (ADVIL) 800 MG tablet ibuprofen 800 mg tablet  TAKE 1 TABLET BY MOUTH EVERY 6 TO 8 HOURS AS NEEDED     levocetirizine (XYZAL) 5 MG tablet levocetirizine 5 mg tablet  TAKE 1 TABLET BY MOUTH IN THE EVENING     metoprolol succinate (TOPROL-XL) 25 MG 24 hr tablet Take 1 tablet by  mouth daily.     Multiple Vitamin (MULTIVITAMIN WITH MINERALS) TABS tablet Take 1 tablet by mouth daily.     Olopatadine HCl (PATADAY) 0.2 % SOLN Place 1 drop into both eyes 2 (two) times daily as needed. 2.5 mL 5   atorvastatin (LIPITOR) 80 MG tablet TAKE 1 TABLET (80 MG TOTAL) BY MOUTH DAILY. 30 tablet 0   pantoprazole (PROTONIX) 40 MG tablet TAKE 1 TABLET (40 MG TOTAL) BY MOUTH DAILY. 30 tablet 0   rOPINIRole (REQUIP) 0.5 MG tablet TAKE 1 TABLET (0.5 MG TOTAL) BY MOUTH AT BEDTIME. 30 tablet 0   sertraline (ZOLOFT) 100 MG tablet TAKE 1 TABLET (100 MG TOTAL) BY MOUTH DAILY. 30 tablet 0   traZODone (DESYREL) 150 MG tablet TAKE 1/2 TABLET (75 MG TOTAL) BY MOUTH AT BEDTIME. 30 tablet 0   No current facility-administered medications for this visit.   Allergies: Allergies  Allergen Reactions   Shellfish Allergy Anaphylaxis    Throat closes up   Social History: Social History   Socioeconomic History   Marital status: Unknown    Spouse name: Not on file   Number of children: Not on file   Years of education: Not on file   Highest education level: Not on file  Occupational History   Not on file  Tobacco Use   Smoking status: Every Day    Packs/day: 0.50    Types: Cigarettes   Smokeless tobacco: Never  Vaping Use   Vaping Use: Never used  Substance and Sexual Activity   Alcohol use: Not Currently   Drug use: Not Currently   Sexual activity: Not on file  Other Topics Concern   Not on file  Social History Narrative   Not on file   Social Determinants of Health   Financial Resource Strain: Not on file  Food Insecurity: Not on file  Transportation Needs: Not on  file  Physical Activity: Not on file  Stress: Not on file  Social Connections: Not on file   Lives in a lives in a single-family home that is built in the 66s.  There are roaches in the house and bed is 2 feet off the floor.  There are dust mite precautions.  Not exposed to fumes, chemicals or dust.  She does not  have a HEPA filter in the home and home is not near an interstate industrial area. Smoking: Active smoker for over 50 years, smoking 1 pack/day Occupation: Retired  Programme researcher, broadcasting/film/video History: Environmental education officer in the house: yes Carpet in the family room: no Carpet in the bedroom: no Heating: gas Cooling: central Pet: yes cat and dog inside the house, cat is inside the bedroom  Family History: Family History  Problem Relation Age of Onset   Diabetes Mother    High blood pressure Mother    Heart attack Father    COPD Father    Heart disease Paternal Uncle      ROS: All others negative except as noted per HPI.   Objective: BP 124/76 (BP Location: Left Arm, Patient Position: Sitting, Cuff Size: Normal)   Pulse 88   Temp 98 F (36.7 C) (Temporal)   Resp 20   Ht 5' 3.5" (1.613 m)   Wt 155 lb 8 oz (70.5 kg)   SpO2 98%   BMI 27.11 kg/m  Body mass index is 27.11 kg/m.  General Appearance:  Alert, cooperative, no distress, appears stated age, slightly slurred speech   Head:  Normocephalic, without obvious abnormality, atraumatic  Eyes:  Conjunctiva clear, EOM's intact  Nose: Nares normal,  erythematous nasal mucosa, yellow rhinnorhea , no visible anterior polyps, and septum midline  Throat: Lips, tongue normal; teeth and gums normal, + cobblestoning  Neck: Supple, symmetrical  Lungs:   clear to auscultation bilaterally, Respirations unlabored, intermittent dry coughing  Heart:  regular rate and rhythm and no murmur, Appears well perfused  Extremities: No edema  Skin: Skin color, texture, turgor normal, no rashes or lesions on visualized portions of skin  Neurologic: No gross deficits   The plan was reviewed with the patient/family, and all questions/concerned were addressed.  It was my pleasure to see Amiel today and participate in her care. Please feel free to contact me with any questions or concerns.  Sincerely,  Roney Marion, MD Allergy & Immunology  [facility] office: [phone] [facility] office: [phone]

## 2022-12-23 NOTE — Telephone Encounter (Signed)
Patient requested  medication be filled last from pcp loratadine 78m sent in #30  she states she goes back and forth between that one and levocetrizine 521m Depending on what insurance cvers or what is available. Medication sent in.

## 2022-12-24 NOTE — Addendum Note (Signed)
Addended by: Brandt Loosen, Skyrah Krupp E on: 12/24/2022 05:20 PM   Modules accepted: Orders

## 2022-12-26 LAB — ALLERGY PANEL 19, SEAFOOD GROUP
Allergen Salmon IgE: <0.1 kU/L
Catfish: <0.1 kU/L
Codfish IgE: <0.1 kU/L
F023-IgE Crab: <0.1 kU/L
F080-IgE Lobster: <0.1 kU/L
Shrimp IgE: <0.1 kU/L
Tuna: <0.1 kU/L

## 2023-01-20 ENCOUNTER — Encounter: Payer: Self-pay | Admitting: Internal Medicine

## 2023-01-20 ENCOUNTER — Ambulatory Visit (INDEPENDENT_AMBULATORY_CARE_PROVIDER_SITE_OTHER): Payer: Medicaid Other | Admitting: Internal Medicine

## 2023-01-20 VITALS — BP 138/84 | HR 90 | Temp 97.7°F | Resp 18 | Wt 156.1 lb

## 2023-01-20 DIAGNOSIS — J4489 Other specified chronic obstructive pulmonary disease: Secondary | ICD-10-CM

## 2023-01-20 DIAGNOSIS — T7800XD Anaphylactic reaction due to unspecified food, subsequent encounter: Secondary | ICD-10-CM | POA: Diagnosis not present

## 2023-01-20 DIAGNOSIS — R011 Cardiac murmur, unspecified: Secondary | ICD-10-CM | POA: Diagnosis not present

## 2023-01-20 DIAGNOSIS — J3089 Other allergic rhinitis: Secondary | ICD-10-CM | POA: Diagnosis not present

## 2023-01-20 DIAGNOSIS — T7800XA Anaphylactic reaction due to unspecified food, initial encounter: Secondary | ICD-10-CM

## 2023-01-20 DIAGNOSIS — J302 Other seasonal allergic rhinitis: Secondary | ICD-10-CM

## 2023-01-20 NOTE — Progress Notes (Incomplete)
Follow Up Note  RE: BLONDINA TOMAYKO MRN: [id number] DOB: Dec 14, 1960 Date of Office Visit: 01/20/2023  Referring provider: Wynn Banker* Primary care provider: Wynn Banker, PA  Chief Complaint: No chief complaint on file.  History of Present Illness: I had the pleasure of seeing Kryslynn Luckenbill for a follow up visit at the [facility]  on 01/20/2023. She is a 62 y.o. female, who is being followed for sthma/copd overlap, allergic rhinoconjunctivitis, seafood allergy . Her previous allergy office visit was on 12/23/22 with Dr. Edison Pace. Today is a regular follow up visit.  History obtained from patient, chart review.  At last visit she was started on advair 3mg for asthma/copd overlap   Today she reports  She reports improvement in cough, wheeze, dyspnea.  She did not pick up rescue inhaler.    Pertinent History/Diagnostics:  - Asthma: COPD overlap, moderate persistent  - fixed obs/restrictive pattern spirometry (12/23/22): ratio 72, 1.33L, 55% FEV1 (pre), + 3%   - Allergic Rhinitis:   - SPT environmental panel (12/23/22): grass pollen, weed pollen, mold, dust mite, cat - Food Allergy (white fish)  - Hx of reaction:  throat closing, loss of conscious, treated in the hospital    - SPT select foods (12/23/22): negative   - sIgE (12/23/22) < 0.10    Assessment and Plan: HAnaizais a 62y.o. female with: No diagnosis found.  *** Plan: There are no Patient Instructions on file for this visit.   No orders of the defined types were placed in this encounter.   Lab Orders  No laboratory test(s) ordered today   Diagnostics: Spirometry:  Tracings reviewed. Her effort: It was hard to get consistent efforts and there is a question as to whether this reflects a maximal maneuver. FVC: 1.87L FEV1: 1.47L, 61% predicted FEV1/FVC ratio: 79% Interpretation: Spirometry consistent with mixed obstructive and restrictive disease. After 4 puffs of albuterol   Please see scanned spirometry results for details.   Results interpreted by myself during this encounter and discussed with patient/family.   Medication List:  Current Outpatient Medications  Medication Sig Dispense Refill   albuterol (VENTOLIN HFA) 108 (90 Base) MCG/ACT inhaler Inhale 2 puffs into the lungs every 4 (four) hours as needed for wheezing or shortness of breath. 18 g 3   aspirin EC 81 MG EC tablet Take 1 tablet (81 mg total) by mouth daily. Swallow whole. 30 tablet 11   atorvastatin (LIPITOR) 80 MG tablet TAKE 1 TABLET (80 MG TOTAL) BY MOUTH DAILY. 30 tablet 0   busPIRone (BUSPAR) 10 MG tablet Take by mouth.     chlorhexidine (PERIDEX) 0.12 % solution chlorhexidine gluconate 0.12 % mouthwash  RINSE MOUTH WITH 15 ML (ONE CAPFUL) FOR 30 SECONDS IN THE MORNING AND EVENING AFTER TOOTHBRUSHING. SPIT OUT AFTER RINSING, DO NOT SWALLOW     clopidogrel (PLAVIX) 75 MG tablet clopidogrel 75 mg tablet  TAKE 1 TABLET (75 MG TOTAL) BY MOUTH DAILY.     docusate sodium (COLACE) 100 MG capsule Colace 100 mg capsule  Take 1 capsule every day by oral route.     EPINEPHrine 0.3 mg/0.3 mL IJ SOAJ injection Inject 0.3 mg into the muscle as needed. 1 each 1   famotidine (PEPCID) 40 MG tablet Take by mouth.     fluticasone (FLONASE) 50 MCG/ACT nasal spray Place 2 sprays into both nostrils daily. 16 g 3   fluticasone-salmeterol (ADVAIR HFA) 45-21 MCG/ACT inhaler Inhale 2 puffs into the lungs 2 (  two) times daily. 1 each 12   gabapentin (NEURONTIN) 100 MG capsule take 1 capsule by mouth every twice each day.     ibuprofen (ADVIL) 800 MG tablet ibuprofen 800 mg tablet  TAKE 1 TABLET BY MOUTH EVERY 6 TO 8 HOURS AS NEEDED     levocetirizine (XYZAL) 5 MG tablet levocetirizine 5 mg tablet  TAKE 1 TABLET BY MOUTH IN THE EVENING     loratadine (CLARITIN) 10 MG tablet Take 1 tablet (10 mg total) by mouth daily for 27 days. Take 1 (one) tablet as needed for allergy's 27 tablet 0   metoprolol succinate  (TOPROL-XL) 25 MG 24 hr tablet Take 1 tablet by mouth daily.     Multiple Vitamin (MULTIVITAMIN WITH MINERALS) TABS tablet Take 1 tablet by mouth daily.     Olopatadine HCl (PATADAY) 0.2 % SOLN Place 1 drop into both eyes 2 (two) times daily as needed. 2.5 mL 5   pantoprazole (PROTONIX) 40 MG tablet TAKE 1 TABLET (40 MG TOTAL) BY MOUTH DAILY. 30 tablet 0   rOPINIRole (REQUIP) 0.5 MG tablet TAKE 1 TABLET (0.5 MG TOTAL) BY MOUTH AT BEDTIME. 30 tablet 0   sertraline (ZOLOFT) 100 MG tablet TAKE 1 TABLET (100 MG TOTAL) BY MOUTH DAILY. 30 tablet 0   traZODone (DESYREL) 150 MG tablet TAKE 1/2 TABLET (75 MG TOTAL) BY MOUTH AT BEDTIME. 30 tablet 0   No current facility-administered medications for this visit.   Allergies: Allergies  Allergen Reactions   Shellfish Allergy Anaphylaxis    Throat closes up   I reviewed her past medical history, social history, family history, and environmental history and no significant changes have been reported from her previous visit.  ROS: All others negative except as noted per HPI.   Objective: There were no vitals taken for this visit. There is no height or weight on file to calculate BMI. General Appearance:  Alert, cooperative, no distress, appears stated age  Head:  Normocephalic, without obvious abnormality, atraumatic  Eyes:  Conjunctiva clear, EOM's intact  Nose: Nares normal, {Blank multiple:19196:a:"***","hypertrophic turbinates","normal mucosa","no visible anterior polyps","septum midline"}  Throat: Lips, tongue normal; teeth and gums normal, {Blank multiple:19196:a:"***","normal posterior oropharynx","tonsils 2+","tonsils 3+","no tonsillar exudate","+ cobblestoning"}  Neck: Supple, symmetrical  Lungs:   {Blank multiple:19196:a:"***","clear to auscultation bilaterally","end-expiratory wheezing","wheezing throughout"}, Respirations unlabored, {Blank multiple:19196:a:"***","no coughing","intermittent dry coughing"}  Heart:  {Blank  multiple:19196:a:"***","regular rate and rhythm","no murmur"}, Appears well perfused  Extremities: No edema  Skin: Skin color, texture, turgor normal, {Blank multiple:19196:a:""***","no rashes or lesions on visualized portions of skin"}  Neurologic: No gross deficits   Previous notes and tests were reviewed. The plan was reviewed with the patient/family, and all questions/concerned were addressed.  It was my pleasure to see Laquashia today and participate in her care. Please feel free to contact me with any questions or concerns.  Sincerely,  Roney Marion, MD  Allergy & Immunology  [facility] Office: [phone]

## 2023-01-20 NOTE — Patient Instructions (Addendum)
Heart murmur:  3/6 systolic murmur  - Please follow up with Primary care physician, as you need an echocardiogram (ultra sound) of your heart )    COPD/ Asthma: improved  - your lung testing today looked inflammation in your lungs that was not reversible  PLAN:  - Spacer sample and demonstration provided. - Controller Inhaler: Continue Advair 41mg  2 puffs twice a day; This Should Be Used Everyday - Rinse mouth out after use - Rescue Inhaler: Albuterol (Proair/Ventolin) 2 puffs . Use  every 4-6 hours as needed for chest tightness, wheezing, or coughing.  Can also use 15 minutes prior to exercise if you have symptoms with activity. - Asthma is not controlled if:  - Symptoms are occurring >2 times a week OR  - >2 times a month nighttime awakenings  - You are requiring systemic steroids (prednisone/steroid injections) more than once per year  - Your require hospitalization for your asthma.  - Please call the clinic to schedule a follow up if these symptoms arise  Chronic Rhinitis Seasonal and Perennial Allergic: improved  - continue allergy avoidance measures to:  grass pollen, weed pollen, mold, dust mite, cat  - Prevention:  - allergen avoidance when possible - consider allergy shots as long term control of your symptoms by teaching your immune system to be more tolerant of your allergy triggers  - Symptom control: - Continue Nasal Steroid Spray: Flonase 1 spray per nostril as needed stuffy nose - Continue  sinus rinses daily 10-15 minute prior to your nose sprays  - Continue cetrizine  '10mg'$  daily   Allergic Conjunctivitis:  - Consider Allergy Eye drops-great options include Pataday (Olopatadine) or Zaditor (ketotifen) for eye symptoms daily as needed-both sold over the counter if not covered by insurance. and Rewetting Drops such as Systane,TheraTears, etc  -Avoid eye drops that say red eye relief as they may contain medications that dry out your eyes.  Food allergy:  - 12/23/22:   skin testing was negative fish and shellfish, sIgE to shellfish/fish: negative  - We can consider a food challenge once your asthma/copd is better controlled  - for SKIN only reaction, okay to take Benadryl 1 capsules every 4 hours - for SKIN + ANY additional symptoms, OR IF concern for LIFE THREATENING reaction = Epipen Autoinjector EpiPen 0.3 mg. - If using Epinephrine autoinjector, call 911 - A food allergy action plan has been provided and discussed. - Medic Alert identification is recommended.  Follow up:  3 months   Thank you so much for letting me partake in your care today.  Don't hesitate to reach out if you have any additional concerns!  ERoney Marion MD  [facility] [place]

## 2023-01-21 MED ORDER — FLUTICASONE-SALMETEROL 45-21 MCG/ACT IN AERO: 2.0000 | INHALATION_SPRAY | Freq: Two times a day (BID) | RESPIRATORY_TRACT | 12 refills | Status: DC

## 2023-01-21 MED ORDER — ALBUTEROL SULFATE HFA 108 (90 BASE) MCG/ACT IN AERS: 2.0000 | INHALATION_SPRAY | RESPIRATORY_TRACT | 3 refills | Status: AC | PRN

## 2023-04-22 ENCOUNTER — Other Ambulatory Visit: Payer: Self-pay

## 2023-04-22 ENCOUNTER — Ambulatory Visit (INDEPENDENT_AMBULATORY_CARE_PROVIDER_SITE_OTHER): Payer: Medicaid Other | Admitting: Internal Medicine

## 2023-04-22 VITALS — BP 130/76 | HR 87 | Temp 98.0°F | Resp 18 | Wt 155.0 lb

## 2023-04-22 DIAGNOSIS — Z720 Tobacco use: Secondary | ICD-10-CM

## 2023-04-22 DIAGNOSIS — I10 Essential (primary) hypertension: Secondary | ICD-10-CM

## 2023-04-22 DIAGNOSIS — T7800XD Anaphylactic reaction due to unspecified food, subsequent encounter: Secondary | ICD-10-CM | POA: Diagnosis not present

## 2023-04-22 DIAGNOSIS — R011 Cardiac murmur, unspecified: Secondary | ICD-10-CM | POA: Diagnosis not present

## 2023-04-22 DIAGNOSIS — J3089 Other allergic rhinitis: Secondary | ICD-10-CM | POA: Diagnosis not present

## 2023-04-22 DIAGNOSIS — J302 Other seasonal allergic rhinitis: Secondary | ICD-10-CM

## 2023-04-22 DIAGNOSIS — I739 Peripheral vascular disease, unspecified: Secondary | ICD-10-CM

## 2023-04-22 DIAGNOSIS — T7800XA Anaphylactic reaction due to unspecified food, initial encounter: Secondary | ICD-10-CM

## 2023-04-22 DIAGNOSIS — I6523 Occlusion and stenosis of bilateral carotid arteries: Secondary | ICD-10-CM

## 2023-04-22 DIAGNOSIS — J4489 Other specified chronic obstructive pulmonary disease: Secondary | ICD-10-CM

## 2023-04-22 DIAGNOSIS — H1045 Other chronic allergic conjunctivitis: Secondary | ICD-10-CM

## 2023-04-22 DIAGNOSIS — E7849 Other hyperlipidemia: Secondary | ICD-10-CM

## 2023-04-22 NOTE — Patient Instructions (Addendum)
Heart disease/ vascular disease  - Follow up with cardiology with all the procedures planned    COPD/ Asthma: improved  - your lung testing today looked inflammation is stable   PLAN:  - Spacer sample and demonstration provided. - Controller Inhaler: Continue Advair  2 puffs twice a day; This Should Be Used Everyday - Rinse mouth out after use - Rescue Inhaler: Albuterol (Proair/Ventolin) 2 puffs . Use  every 4-6 hours as needed for chest tightness, wheezing, or coughing.  Can also use 15 minutes prior to exercise if you have symptoms with activity. - Asthma is not controlled if:  - Symptoms are occurring >2 times a week OR  - >2 times a month nighttime awakenings  - You are requiring systemic steroids (prednisone/steroid injections) more than once per year  - Your require hospitalization for your asthma.  - Please call the clinic to schedule a follow up if these symptoms arise  Chronic Rhinitis Seasonal and Perennial Allergic: improved  - continue allergy avoidance measures to:  grass pollen, weed pollen, mold, dust mite, cat  - Prevention:  - allergen avoidance when possible - consider allergy shots as long term control of your symptoms by teaching your immune system to be more tolerant of your allergy triggers  - Symptom control: - Continue Nasal Steroid Spray: Flonase 1 spray per nostril as needed stuffy nose - Continue  sinus rinses daily 10-15 minute prior to your nose sprays  - Continue cetrizine  10mg  daily   Allergic Conjunctivitis:  - Consider Allergy Eye drops-great options include Pataday (Olopatadine) or Zaditor (ketotifen) for eye symptoms daily as needed-both sold over the counter if not covered by insurance. and Rewetting Drops such as Systane,TheraTears, etc  -Avoid eye drops that say red eye relief as they may contain medications that dry out your eyes.  Food allergy:  - 12/23/22:  skin testing was negative fish and shellfish, sIgE to shellfish/fish:  negative  - We can consider a food challenge once your asthma/copd is better controlled  - for SKIN only reaction, okay to take Benadryl 1 capsules every 4 hours - for SKIN + ANY additional symptoms, OR IF concern for LIFE THREATENING reaction = Epipen Autoinjector EpiPen 0.3 mg. - If using Epinephrine autoinjector, call 911 - A food allergy action plan has been provided and discussed. - Medic Alert identification is recommended.  Try to quit smoking.  It will Help!!!!   Follow up:  3 months   Thank you so much for letting me partake in your care today.  Don't hesitate to reach out if you have any additional concerns!  Ferol Luz, MD  [facility] [place] [place]

## 2023-04-22 NOTE — Progress Notes (Signed)
Follow Up Note  RE: [name] MRN: [id number] DOB: [birth year] Date of Office Visit: 04/22/2023  Referring provider: Loura Pardon* Primary care provider: Loura Pardon, PA  Chief Complaint: Follow-up and Asthma (Does not use inhaler only goes to church and grocery store)  History of Present Illness: I had the pleasure of seeing [name] for a follow up visit at the [facility] on 04/22/2023. She is a 62 y.o. female, who is being followed for sthma/copd overlap, allergic rhinoconjunctivitis, seafood allergy . Her previous allergy office visit was on 01/20/23 with Dr. Marlynn Perking. Today is a regular follow up visit.  History obtained from patient, chart review.   Today she reports  Dyspnea is at baseline, coughing has slightly increased  She did pick up albuterol. Needing it once a week for daytime symptoms  Taking  Advair 45 mcg.  Reports getting approximately 4/7 days a week  No OCS/ABX since last visit.   For rhinitis she is on cetirizine 10 mg daily, using Flonase 1 time per week times a week.  She has not needed any allergy eyedrops.    Continues to avoid all seafood.  Does not interested in food challenge at this time, but may be in the future once her breathing is better controlled.  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   It was recommended she start chantix to smoking smoking, but she could not get prescription.   Pertinent History/Diagnostics:  - Asthma: COPD overlap, moderate persistent, improved on advair   - active smoker (1ppd)  - fixed obs/restrictive pattern spirometry (12/23/22): ratio 72, 1.33L, 55% FEV1 (pre), + 3%   - Allergic Rhinitis:   - SPT environmental panel (12/23/22): grass pollen, weed  pollen, mold, dust mite, cat - Food Allergy (white fish)  - Hx of reaction:  throat closing, loss of conscious, treated in the hospital    - SPT select foods (12/23/22): negative   - sIgE (12/23/22) < 0.10    Assessment and Plan: Izona is a 62 y.o. female with: Asthma-COPD overlap syndrome - Plan: Spirometry with Graph  Seasonal and perennial allergic rhinitis  Allergy with anaphylaxis due to food  Heart murmur, systolic  Other chronic allergic conjunctivitis of both eyes  Tobacco use  Primary hypertension  Other hyperlipidemia  Bilateral carotid artery stenosis  Peripheral vascular disease (HCC)   Plan: Patient Instructions  Heart disease/ vascular disease  - Follow up with cardiology with all the procedures planned    COPD/ Asthma: improved  - your lung testing today looked inflammation is stable   PLAN:  - Spacer sample and demonstration provided. - Controller Inhaler: Continue Advair  2 puffs twice a day; This Should Be Used Everyday - Rinse mouth out after use - Rescue Inhaler: Albuterol (Proair/Ventolin) 2 puffs . Use  every 4-6 hours as needed for chest tightness, wheezing, or coughing.  Can also use 15 minutes prior to exercise if you have symptoms with activity. - Asthma is not controlled if:  - Symptoms are occurring >2 times a week OR  - >2 times a month nighttime awakenings  - You are requiring systemic steroids (prednisone/steroid injections) more than once per year  - Your require hospitalization for your asthma.  - Please call the clinic to schedule  a follow up if these symptoms arise  Chronic Rhinitis Seasonal and Perennial Allergic: improved  - continue allergy avoidance measures to:  grass pollen, weed pollen, mold, dust mite, cat  - Prevention:  - allergen avoidance when possible - consider allergy shots as long term control of your symptoms by teaching your immune system to be more tolerant of your allergy triggers  - Symptom  control: - Continue Nasal Steroid Spray: Flonase 1 spray per nostril as needed stuffy nose - Continue  sinus rinses daily 10-15 minute prior to your nose sprays  - Continue cetrizine  10mg  daily   Allergic Conjunctivitis:  - Consider Allergy Eye drops-great options include Pataday (Olopatadine) or Zaditor (ketotifen) for eye symptoms daily as needed-both sold over the counter if not covered by insurance. and Rewetting Drops such as Systane,TheraTears, etc  -Avoid eye drops that say red eye relief as they may contain medications that dry out your eyes.  Food allergy:  - 12/23/22:  skin testing was negative fish and shellfish, sIgE to shellfish/fish: negative  - We can consider a food challenge once your asthma/copd is better controlled  - for SKIN only reaction, okay to take Benadryl 1 capsules every 4 hours - for SKIN + ANY additional symptoms, OR IF concern for LIFE THREATENING reaction = Epipen Autoinjector EpiPen 0.3 mg. - If using Epinephrine autoinjector, call 911 - A food allergy action plan has been provided and discussed. - Medic Alert identification is recommended.  Try to quit smoking.  It will Help!!!!   Follow up:  3 months   Thank you so much for letting me partake in your care today.  Don't hesitate to reach out if you have any additional concerns!  Ferol Luz, MD  [facility] [place] [place]     No orders of the defined types were placed in this encounter.   Lab Orders  No laboratory test(s) ordered today   Diagnostics: Spirometry:  Tracings reviewed. Her effort: It was hard to get consistent efforts and there is a question as to whether this reflects a maximal maneuver. FVC: 1.79L FEV1: 1.36L, 57% predicted FEV1/FVC ratio: 76% Interpretation: Spirometry consistent with mixed obstructive and restrictive disease. Slightly worse from prior  Please see scanned spirometry results for details.   Results interpreted by myself during this  encounter and discussed with patient/family.   Medication List:  Current Outpatient Medications  Medication Sig Dispense Refill   albuterol (VENTOLIN HFA) 108 (90 Base) MCG/ACT inhaler Inhale 2 puffs into the lungs every 4 (four) hours as needed for wheezing or shortness of breath. 18 g 3   aspirin EC 81 MG EC tablet Take 1 tablet (81 mg total) by mouth daily. Swallow whole. 30 tablet 11   atorvastatin (LIPITOR) 80 MG tablet TAKE 1 TABLET (80 MG TOTAL) BY MOUTH DAILY. 30 tablet 0   busPIRone (BUSPAR) 10 MG tablet Take by mouth.     chlorhexidine (PERIDEX) 0.12 % solution chlorhexidine gluconate 0.12 % mouthwash  RINSE MOUTH WITH 15 ML (ONE CAPFUL) FOR 30 SECONDS IN THE MORNING AND EVENING AFTER TOOTHBRUSHING. SPIT OUT AFTER RINSING, DO NOT SWALLOW     clopidogrel (PLAVIX) 75 MG tablet clopidogrel 75 mg tablet  TAKE 1 TABLET (75 MG TOTAL) BY MOUTH DAILY.     docusate sodium (COLACE) 100 MG capsule Colace 100 mg capsule  Take 1 capsule every day by oral route.     EPINEPHrine 0.3 mg/0.3 mL IJ SOAJ injection Inject 0.3 mg into the  muscle as needed. 1 each 1   famotidine (PEPCID) 40 MG tablet Take by mouth.     fluticasone (FLONASE) 50 MCG/ACT nasal spray Place 2 sprays into both nostrils daily. 16 g 3   fluticasone-salmeterol (ADVAIR HFA) 45-21 MCG/ACT inhaler Inhale 2 puffs into the lungs 2 (two) times daily. 1 each 12   gabapentin (NEURONTIN) 100 MG capsule take 1 capsule by mouth every twice each day.     ibuprofen (ADVIL) 800 MG tablet ibuprofen 800 mg tablet  TAKE 1 TABLET BY MOUTH EVERY 6 TO 8 HOURS AS NEEDED     levocetirizine (XYZAL) 5 MG tablet levocetirizine 5 mg tablet  TAKE 1 TABLET BY MOUTH IN THE EVENING     loratadine (CLARITIN) 10 MG tablet Take 1 tablet (10 mg total) by mouth daily for 27 days. Take 1 (one) tablet as needed for allergy's 27 tablet 0   metoprolol succinate (TOPROL-XL) 25 MG 24 hr tablet Take 1 tablet by mouth daily.     Multiple Vitamin (MULTIVITAMIN WITH  MINERALS) TABS tablet Take 1 tablet by mouth daily.     Olopatadine HCl (PATADAY) 0.2 % SOLN Place 1 drop into both eyes 2 (two) times daily as needed. 2.5 mL 5   pantoprazole (PROTONIX) 40 MG tablet TAKE 1 TABLET (40 MG TOTAL) BY MOUTH DAILY. 30 tablet 0   rOPINIRole (REQUIP) 0.5 MG tablet TAKE 1 TABLET (0.5 MG TOTAL) BY MOUTH AT BEDTIME. 30 tablet 0   sertraline (ZOLOFT) 100 MG tablet TAKE 1 TABLET (100 MG TOTAL) BY MOUTH DAILY. 30 tablet 0   traZODone (DESYREL) 150 MG tablet TAKE 1/2 TABLET (75 MG TOTAL) BY MOUTH AT BEDTIME. 30 tablet 0   No current facility-administered medications for this visit.   Allergies: Allergies  Allergen Reactions   Shellfish Allergy Anaphylaxis    Throat closes up   I reviewed her past medical history, social history, family history, and environmental history and no significant changes have been reported from her previous visit.  ROS: All others negative except as noted per HPI.   Objective: BP 130/76 (BP Location: Left Arm, Patient Position: Sitting, Cuff Size: Normal)   Pulse 87   Temp 98 F (36.7 C) (Temporal)   Resp 18   Wt 155 lb (70.3 kg)   SpO2 99%   BMI 27.03 kg/m  Body mass index is 27.03 kg/m. General Appearance:  Alert, cooperative, no distress, appears stated age  Head:  Normocephalic, without obvious abnormality, atraumatic  Eyes:  Conjunctiva clear, EOM's intact  Nose: Nares normal,  erythematous nasal mucosa, no visible anterior polyps, and septum midline  Throat: Lips, tongue normal; teeth and gums normal, normal posterior oropharynx  Neck: Supple, symmetrical  Lungs:   clear to auscultation bilaterally, Respirations unlabored, no coughing  Heart:  2/6 systolic murmur heard best on the left upper sternal border and regular rate and rhythm, Appears well perfused  Extremities: No edema  Skin: Skin color, texture, turgor normal, no rashes or lesions on visualized portions of skin  Neurologic: No gross deficits   Previous notes and  tests were reviewed. The plan was reviewed with the patient/family, and all questions/concerned were addressed.  It was my pleasure to see Rebecca Sutton today and participate in her care. Please feel free to contact me with any questions or concerns.  Sincerely,  Ferol Luz, MD  Allergy & Immunology  [facility] Office: [phone]

## 2023-05-12 HISTORY — PX: OTHER SURGICAL HISTORY: SHX169

## 2023-05-21 ENCOUNTER — Telehealth: Payer: Self-pay

## 2023-05-21 NOTE — Telephone Encounter (Signed)
Okay thank you for the update.

## 2023-05-21 NOTE — Telephone Encounter (Signed)
Pts daughter called and said pt just had open heart surgery an repaired 3 arteries in her heart and that contributed to the low oxygen.

## 2023-08-05 ENCOUNTER — Encounter: Payer: Self-pay | Admitting: Internal Medicine

## 2023-08-05 ENCOUNTER — Ambulatory Visit: Payer: Medicaid Other | Admitting: Internal Medicine

## 2023-08-05 VITALS — BP 142/74 | HR 82 | Temp 97.3°F | Resp 16

## 2023-08-05 DIAGNOSIS — T7800XD Anaphylactic reaction due to unspecified food, subsequent encounter: Secondary | ICD-10-CM | POA: Diagnosis not present

## 2023-08-05 DIAGNOSIS — J302 Other seasonal allergic rhinitis: Secondary | ICD-10-CM

## 2023-08-05 DIAGNOSIS — J3089 Other allergic rhinitis: Secondary | ICD-10-CM

## 2023-08-05 DIAGNOSIS — J4489 Other specified chronic obstructive pulmonary disease: Secondary | ICD-10-CM | POA: Diagnosis not present

## 2023-08-05 DIAGNOSIS — Z720 Tobacco use: Secondary | ICD-10-CM | POA: Diagnosis not present

## 2023-08-05 DIAGNOSIS — I10 Essential (primary) hypertension: Secondary | ICD-10-CM

## 2023-08-05 DIAGNOSIS — T7800XA Anaphylactic reaction due to unspecified food, initial encounter: Secondary | ICD-10-CM

## 2023-08-05 MED ORDER — FLUTICASONE-SALMETEROL 45-21 MCG/ACT IN AERO: 2.0000 | INHALATION_SPRAY | Freq: Two times a day (BID) | RESPIRATORY_TRACT | 12 refills | Status: DC

## 2023-08-05 NOTE — Progress Notes (Signed)
Follow Up Note  RE: Rebecca Sutton MRN: [id number] DOB: [birth year] Date of Office Visit: 08/05/2023  Referring provider: Loura Pardon* Primary care provider: Loura Pardon, PA  Chief Complaint: Asthma ("Breathing hard" )  History of Present Illness: I had the pleasure of seeing [name] for a follow up visit at the [facility] on 08/05/2023. She is a 62 y.o. female, who is being followed for sthma/copd overlap, allergic rhinoconjunctivitis, seafood allergy . Her previous allergy office visit was on 04/22/23 with Dr. Marlynn Perking. Today is a regular follow up visit.  History obtained from patient, chart review.   Today she reports  Rare coughing, dyspnea with exertion  Not using albuterol for symptoms  Taking  Advair 45 mcg.  Reports getting approximately 1/7 days a week  No OCS/ABX since last visit.  ACT 18/24  For rhinitis she is on cetirizine 10 mg and  Flonase 1 time per week times a week.  She has not needed any allergy eyedrops.  Still with mild rhinnorhea and sneezing   Continues to avoid all seafood. Is interested in reintroduction if it is an option   She was admitted to the hospital on 05/12/2023 for angina abnormal stress test.  Cardiac cath showed multivessel disease.  She went to the OR on July 5 he underwent a three-vessel CABG.  Since then she has been recovering well.  She is still actively smoking.  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 female with: Asthma-COPD overlap syndrome  Seasonal and perennial allergic rhinitis  Allergy with anaphylaxis due to food  Tobacco use  Primary hypertension   Plan: Patient Instructions  COPD/ Asthma: improved, noncompliance with with medications  - your lung testing today looked inflammation is stable   PLAN:  - Spacer sample and demonstration provided. - Controller Inhaler: Continue Advair  2 puffs twice a day; This Should Be Used Everyday - Rinse mouth out after use  -  set a phone reminder and post its on the door  - Rescue Inhaler: Albuterol (Proair/Ventolin) 2 puffs . Use  every 4-6 hours as needed for chest tightness, wheezing, or coughing.  Can also use 15 minutes prior to exercise if you have symptoms with activity. - Asthma is not controlled if:  - Symptoms are occurring >2 times a week OR  - >2 times a month nighttime awakenings  - You are requiring systemic steroids (prednisone/steroid injections) more than once per year  - Your require hospitalization for your asthma.  - Please call the clinic to schedule a follow up if these symptoms arise  Chronic Rhinitis Seasonal and Perennial Allergic: improved  - continue allergy avoidance measures to:  grass pollen, weed pollen, mold, dust mite, cat  - Prevention:  - allergen avoidance when possible - consider allergy shots as long  term control of your symptoms by teaching your immune system to be more tolerant of your allergy triggers  - Symptom control: - Continue Nasal Steroid Spray: Flonase 1 spray per nostril as needed stuffy nose - Continue  sinus rinses daily 10-15 minute prior to your nose sprays  - Continue cetrizine  10mg  daily   Allergic Conjunctivitis:  - Consider Allergy Eye drops-great options include Pataday (Olopatadine) or Zaditor (ketotifen) for eye symptoms daily as needed-both sold over the  counter if not covered by insurance. and Rewetting Drops such as Systane,TheraTears, etc  -Avoid eye drops that say red eye relief as they may contain medications that dry out your eyes.  Food allergy:  I will not recommend reintroduction, until your breathing is better.  Hopefully this is incentive to remember to take your meds! - 12/23/22:  skin testing was negative fish and shellfish, sIgE to shellfish/fish: negative  - We can consider a food challenge once your asthma/copd is better controlled  - for SKIN only reaction, okay to take Benadryl 1 capsules every 4 hours - for SKIN + ANY additional symptoms, OR IF concern for LIFE THREATENING reaction = Epipen Autoinjector EpiPen 0.3 mg. - If using Epinephrine autoinjector, call 911 - A food allergy action plan has been provided and discussed. - Medic Alert identification is recommended.  Try to quit smoking.  It will Help!!!!   Follow up:  3 months   Thank you so much for letting me partake in your care today.  Don't hesitate to reach out if you have any additional concerns!  Ferol Luz, MD  [facility] [place] [place]    No orders of the defined types were placed in this encounter.   Lab Orders  No laboratory test(s) ordered today   Diagnostics: Spirometry:  Tracings reviewed. Her effort: Good reproducible efforts. FVC: 1.89L FEV1: 1.46L, 59% predicted FEV1/FVC ratio: 75% Interpretation: Spirometry consistent with mixed obstructive and restrictive disease. Slightly worse from prior  Please see scanned spirometry results for details.   Results interpreted by myself during this encounter and discussed with patient/family.   Medication List:  Current Outpatient Medications  Medication Sig Dispense Refill   albuterol (VENTOLIN HFA) 108 (90 Base) MCG/ACT inhaler Inhale 2 puffs into the lungs every 4 (four) hours as needed for wheezing or shortness of breath. 18 g 3   aspirin EC 81 MG EC tablet Take 1  tablet (81 mg total) by mouth daily. Swallow whole. 30 tablet 11   busPIRone (BUSPAR) 10 MG tablet Take by mouth.     chlorhexidine (PERIDEX) 0.12 % solution chlorhexidine gluconate 0.12 % mouthwash  RINSE MOUTH WITH 15 ML (ONE CAPFUL) FOR 30 SECONDS IN THE MORNING AND EVENING AFTER TOOTHBRUSHING. SPIT OUT AFTER RINSING, DO NOT SWALLOW     clopidogrel (PLAVIX) 75 MG tablet clopidogrel 75 mg tablet  TAKE 1 TABLET (75 MG TOTAL) BY MOUTH DAILY.     docusate sodium (COLACE) 100 MG capsule Colace 100 mg capsule  Take 1 capsule every day by oral route.     EPINEPHrine 0.3 mg/0.3 mL IJ SOAJ injection Inject 0.3 mg into the muscle as needed. 1 each 1   famotidine (PEPCID) 40 MG tablet Take by mouth.     fluticasone (FLONASE) 50 MCG/ACT nasal spray Place 2 sprays into both nostrils daily. 16 g 3   fluticasone-salmeterol (ADVAIR HFA) 45-21 MCG/ACT inhaler Inhale 2 puffs into the lungs 2 (two) times daily. 1 each 12   gabapentin (NEURONTIN) 100  MG capsule take 1 capsule by mouth every twice each day.     ibuprofen (ADVIL) 800 MG tablet ibuprofen 800 mg tablet  TAKE 1 TABLET BY MOUTH EVERY 6 TO 8 HOURS AS NEEDED     levocetirizine (XYZAL) 5 MG tablet levocetirizine 5 mg tablet  TAKE 1 TABLET BY MOUTH IN THE EVENING     levothyroxine (SYNTHROID) 100 MCG tablet Take by mouth.     metoprolol succinate (TOPROL-XL) 25 MG 24 hr tablet Take 1 tablet by mouth daily.     Multiple Vitamin (MULTIVITAMIN WITH MINERALS) TABS tablet Take 1 tablet by mouth daily.     Olopatadine HCl (PATADAY) 0.2 % SOLN Place 1 drop into both eyes 2 (two) times daily as needed. 2.5 mL 5   atorvastatin (LIPITOR) 80 MG tablet TAKE 1 TABLET (80 MG TOTAL) BY MOUTH DAILY. 30 tablet 0   loratadine (CLARITIN) 10 MG tablet Take 1 tablet (10 mg total) by mouth daily for 27 days. Take 1 (one) tablet as needed for allergy's 27 tablet 0   No current facility-administered medications for this visit.   Allergies: Allergies  Allergen Reactions    Shellfish Allergy Anaphylaxis    Throat closes up   I reviewed her past medical history, social history, family history, and environmental history and no significant changes have been reported from her previous visit.  ROS: All others negative except as noted per HPI.   Objective: BP (!) 142/74   Pulse 82   Temp (!) 97.3 F (36.3 C) (Temporal)   Resp 16   SpO2 99%  There is no height or weight on file to calculate BMI. General Appearance:  Alert, cooperative, no distress, appears stated age  Head:  Normocephalic, without obvious abnormality, atraumatic  Eyes:  Conjunctiva clear, EOM's intact  Nose: Nares normal,  erythematous nasal mucosa, no visible anterior polyps, and septum midline  Throat: Lips, tongue normal; teeth and gums normal, normal posterior oropharynx  Neck: Supple, symmetrical  Lungs:   clear to auscultation bilaterally, Respirations unlabored, no coughing  Heart:  2/6 systolic murmur heard best on the left upper sternal border and regular rate and rhythm, Appears well perfused  Extremities: No edema  Skin: Skin color, texture, turgor normal, no rashes or lesions on visualized portions of skin  Neurologic: No gross deficits   Previous notes and tests were reviewed. The plan was reviewed with the patient/family, and all questions/concerned were addressed.  It was my pleasure to see Maigen today and participate in her care. Please feel free to contact me with any questions or concerns.  Sincerely,  Ferol Luz, MD  Allergy & Immunology  [facility] Office: [phone]

## 2023-08-05 NOTE — Patient Instructions (Addendum)
COPD/ Asthma: improved, noncompliance with with medications  - your lung testing today looked inflammation is stable   PLAN:  - Spacer sample and demonstration provided. - Controller Inhaler: Continue Advair  2 puffs twice a day; This Should Be Used Everyday - Rinse mouth out after use  -  set a phone reminder and post its on the door  - Rescue Inhaler: Albuterol (Proair/Ventolin) 2 puffs . Use  every 4-6 hours as needed for chest tightness, wheezing, or coughing.  Can also use 15 minutes prior to exercise if you have symptoms with activity. - Asthma is not controlled if:  - Symptoms are occurring >2 times a week OR  - >2 times a month nighttime awakenings  - You are requiring systemic steroids (prednisone/steroid injections) more than once per year  - Your require hospitalization for your asthma.  - Please call the clinic to schedule a follow up if these symptoms arise  Chronic Rhinitis Seasonal and Perennial Allergic: improved  - continue allergy avoidance measures to:  grass pollen, weed pollen, mold, dust mite, cat  - Prevention:  - allergen avoidance when possible - consider allergy shots as long term control of your symptoms by teaching your immune system to be more tolerant of your allergy triggers  - Symptom control: - Continue Nasal Steroid Spray: Flonase 1 spray per nostril as needed stuffy nose - Continue  sinus rinses daily 10-15 minute prior to your nose sprays  - Continue cetrizine  10mg  daily   Allergic Conjunctivitis:  - Consider Allergy Eye drops-great options include Pataday (Olopatadine) or Zaditor (ketotifen) for eye symptoms daily as needed-both sold over the counter if not covered by insurance. and Rewetting Drops such as Systane,TheraTears, etc  -Avoid eye drops that say red eye relief as they may contain medications that dry out your eyes.  Food allergy:  I will not recommend reintroduction, until your breathing is better.  Hopefully this is incentive to  remember to take your meds! - 12/23/22:  skin testing was negative fish and shellfish, sIgE to shellfish/fish: negative  - We can consider a food challenge once your asthma/copd is better controlled  - for SKIN only reaction, okay to take Benadryl 1 capsules every 4 hours - for SKIN + ANY additional symptoms, OR IF concern for LIFE THREATENING reaction = Epipen Autoinjector EpiPen 0.3 mg. - If using Epinephrine autoinjector, call 911 - A food allergy action plan has been provided and discussed. - Medic Alert identification is recommended.  Try to quit smoking.  It will Help!!!!   Follow up:  3 months   Thank you so much for letting me partake in your care today.  Don't hesitate to reach out if you have any additional concerns!  Ferol Luz, MD  [facility] [place] [place]

## 2023-08-06 NOTE — Addendum Note (Signed)
Addended by: Berna Bue on: 08/06/2023 08:11 AM   Modules accepted: Orders

## 2023-09-15 ENCOUNTER — Other Ambulatory Visit: Payer: Self-pay

## 2023-09-15 MED ORDER — FLUTICASONE-SALMETEROL 45-21 MCG/ACT IN AERO: 2.0000 | INHALATION_SPRAY | Freq: Two times a day (BID) | RESPIRATORY_TRACT | 12 refills | Status: DC

## 2023-11-04 ENCOUNTER — Ambulatory Visit: Payer: Medicaid Other | Admitting: Internal Medicine

## 2023-11-04 ENCOUNTER — Encounter: Payer: Self-pay | Admitting: Internal Medicine

## 2023-11-04 VITALS — BP 120/78 | HR 101 | Temp 97.5°F | Resp 18

## 2023-11-04 DIAGNOSIS — J3089 Other allergic rhinitis: Secondary | ICD-10-CM | POA: Diagnosis not present

## 2023-11-04 DIAGNOSIS — J0100 Acute maxillary sinusitis, unspecified: Secondary | ICD-10-CM

## 2023-11-04 DIAGNOSIS — J302 Other seasonal allergic rhinitis: Secondary | ICD-10-CM

## 2023-11-04 DIAGNOSIS — T7800XA Anaphylactic reaction due to unspecified food, initial encounter: Secondary | ICD-10-CM

## 2023-11-04 DIAGNOSIS — T7800XD Anaphylactic reaction due to unspecified food, subsequent encounter: Secondary | ICD-10-CM

## 2023-11-04 DIAGNOSIS — J4489 Other specified chronic obstructive pulmonary disease: Secondary | ICD-10-CM | POA: Diagnosis not present

## 2023-11-04 DIAGNOSIS — Z720 Tobacco use: Secondary | ICD-10-CM

## 2023-11-04 MED ORDER — AMOXICILLIN-POT CLAVULANATE 875-125 MG PO TABS: 1.0000 | ORAL_TABLET | Freq: Two times a day (BID) | ORAL | 0 refills | Status: AC

## 2023-11-04 MED ORDER — FLUTICASONE-SALMETEROL 45-21 MCG/ACT IN AERO: 2.0000 | INHALATION_SPRAY | Freq: Two times a day (BID) | RESPIRATORY_TRACT | 12 refills | Status: AC

## 2023-11-04 NOTE — Patient Instructions (Addendum)
COPD/ Asthma: improved, - We will repeat spirometry at follow up visit   PLAN:  - Spacer sample and demonstration provided. - Controller Inhaler: Continue Advair  2 puffs twice a day; This Should Be Used Everyday - Rinse mouth out after use - Rescue Inhaler: Albuterol (Proair/Ventolin) 2 puffs . Use  every 4-6 hours as needed for chest tightness, wheezing, or coughing.  Can also use 15 minutes prior to exercise if you have symptoms with activity. - Asthma is not controlled if:  - Symptoms are occurring >2 times a week OR  - >2 times a month nighttime awakenings  - You are requiring systemic steroids (prednisone/steroid injections) more than once per year  - Your require hospitalization for your asthma.  - Please call the clinic to schedule a follow up if these symptoms arise  Chronic Rhinitis Seasonal and Perennial Allergic: worse with acute sinus infection  - continue allergy avoidance measures to:  grass pollen, weed pollen, mold, dust mite, cat - start augmentin 875/125mg  twice daily for 7 days   - Prevention:  - allergen avoidance when possible - consider allergy shots as long term control of your symptoms by teaching your immune system to be more tolerant of your allergy triggers  - Symptom control: - Continue Nasal Steroid Spray: Flonase 1 spray per nostril as needed stuffy nose - Continue  sinus rinses daily 10-15 minute prior to your nose sprays  - Continue cetrizine  10mg  daily   Allergic Conjunctivitis:  - Consider Allergy Eye drops-great options include Pataday (Olopatadine) or Zaditor (ketotifen) for eye symptoms daily as needed-both sold over the counter if not covered by insurance. and Rewetting Drops such as Systane,TheraTears, etc  -Avoid eye drops that say red eye relief as they may contain medications that dry out your eyes.  Food allergy - 12/23/22:  skin testing was negative fish and shellfish, sIgE to shellfish/fish: negative  - We can consider a food  challenge once your asthma/copd is better controlled  - for SKIN only reaction, okay to take Benadryl 1 capsules every 4 hours - for SKIN + ANY additional symptoms, OR IF concern for LIFE THREATENING reaction = Epipen Autoinjector EpiPen 0.3 mg. - If using Epinephrine autoinjector, call 911 - A food allergy action plan has been provided and discussed. - Medic Alert identification is recommended.  Congrats on cutting back on smoking.  I know you can quit for good!   Follow up:  3 months   Thank you so much for letting me partake in your care today.  Don't hesitate to reach out if you have any additional concerns!  Ferol Luz, MD  [facility] [place] [place]

## 2023-11-04 NOTE — Progress Notes (Signed)
Follow Up Note  RE: Rebecca Sutton MRN: [id number] DOB: 12-06-1960 Date of Office Visit: 11/04/2023  Referring provider: Loura Pardon* Primary care provider: Loura Pardon, PA  Chief Complaint: Asthma  History of Present Illness: I had the pleasure of seeing Rebecca Sutton for a follow up visit at the [facility] on 11/04/2023. She is a 62 y.o. female, who is being followed for sthma/copd overlap, allergic rhinoconjunctivitis, seafood allergy . Her previous allergy office visit was on 08/05/23 with Dr. Marlynn Perking. Today is a regular follow up visit.  History obtained from patient, chart review.  At that visit she was recovering from three-vessel CABG.  She reported noncompliance with her inhalers.FEV1: 1.46L, 59% predicted.  She is continued on Advair, Flonase, cetirizine with focus on daily use.  Today she reports  She has been diagnosed with anxiety since last visit.  She had a carotid artery stent.   Rare coughing, dyspnea with exertion , which is her baseline Not using albuterol for symptoms  Taking  Advair 45 mcg 2 puffs in AM.  Reports getting approximately 7/7 days a week  She was on prednisone last month when they scheduled her stent.  ACT 21/24  For rhinitis she is on cetirizine 10 mg and  Flonase 1 time per week times a week.  She has not needed any allergy eyedrops.  Worsening sinus pressure, rhinnorrhea and ear fullness over the past week.  L> R   Continues to avoid all seafood. 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 female with: Acute non-recurrent maxillary sinusitis  Asthma-COPD overlap syndrome (HCC)  Seasonal and perennial allergic rhinitis  Allergy with anaphylaxis due to food  Tobacco use   Plan: Patient Instructions  COPD/ Asthma: improved, - We will repeat spirometry at follow up visit   PLAN:  - Spacer sample and demonstration provided. - Controller Inhaler: Continue Advair  2 puffs twice a day; This Should Be Used Everyday - Rinse mouth out after use - Rescue Inhaler: Albuterol (Proair/Ventolin) 2 puffs . Use  every 4-6 hours as needed for chest tightness, wheezing, or coughing.  Can also use 15 minutes prior to exercise if you have symptoms with activity. - Asthma is not controlled if:  - Symptoms are occurring >2 times a week OR  - >2 times a month nighttime awakenings  - You are requiring systemic steroids (prednisone/steroid injections) more than once per year  - Your require hospitalization for your asthma.  - Please call the clinic to schedule a follow up if these symptoms arise  Chronic Rhinitis Seasonal and Perennial Allergic: worse with acute sinus infection  - continue allergy avoidance measures to:  grass pollen, weed pollen, mold, dust mite, cat - start augmentin 875/125mg  twice daily for 7 days   - Prevention:  - allergen  avoidance when possible - consider allergy shots as long term control of your symptoms by teaching your immune system to be more tolerant of your allergy triggers  - Symptom control: - Continue Nasal Steroid Spray: Flonase 1 spray per nostril as needed stuffy nose - Continue  sinus rinses daily 10-15 minute prior to your nose sprays  - Continue cetrizine  10mg  daily   Allergic Conjunctivitis:  - Consider Allergy Eye drops-great options include Pataday  (Olopatadine) or Zaditor (ketotifen) for eye symptoms daily as needed-both sold over the counter if not covered by insurance. and Rewetting Drops such as Systane,TheraTears, etc  -Avoid eye drops that say red eye relief as they may contain medications that dry out your eyes.  Food allergy - 12/23/22:  skin testing was negative fish and shellfish, sIgE to shellfish/fish: negative  - We can consider a food challenge once your asthma/copd is better controlled  - for SKIN only reaction, okay to take Benadryl 1 capsules every 4 hours - for SKIN + ANY additional symptoms, OR IF concern for LIFE THREATENING reaction = Epipen Autoinjector EpiPen 0.3 mg. - If using Epinephrine autoinjector, call 911 - A food allergy action plan has been provided and discussed. - Medic Alert identification is recommended.  Congrats on cutting back on smoking.  I know you can quit for good!   Follow up:  3 months   Thank you so much for letting me partake in your care today.  Don't hesitate to reach out if you have any additional concerns!  Ferol Luz, MD  [facility] [place] [place]   Meds ordered this encounter  Medications   amoxicillin-clavulanate (AUGMENTIN) 875-125 MG tablet    Sig: Take 1 tablet by mouth 2 (two) times daily for 7 days.    Dispense:  14 tablet    Refill:  0   fluticasone-salmeterol (ADVAIR HFA) 45-21 MCG/ACT inhaler    Sig: Inhale 2 puffs into the lungs 2 (two) times daily.    Dispense:  1 each    Refill:  12    Lab Orders  No laboratory test(s) ordered today   Diagnostics: None done     Medication List:  Current Outpatient Medications  Medication Sig Dispense Refill   albuterol (VENTOLIN HFA) 108 (90 Base) MCG/ACT inhaler Inhale 2 puffs into the lungs every 4 (four) hours as needed for wheezing or shortness of breath. 18 g 3   amoxicillin-clavulanate (AUGMENTIN) 875-125 MG tablet Take 1 tablet by mouth 2 (two) times daily for 7 days. 14 tablet 0   aspirin  EC 81 MG EC tablet Take 1 tablet (81 mg total) by mouth daily. Swallow whole. 30 tablet 11   atorvastatin (LIPITOR) 80 MG tablet TAKE 1 TABLET (80 MG TOTAL) BY MOUTH DAILY. 30 tablet 0   busPIRone (BUSPAR) 10 MG tablet Take by mouth.     chlorhexidine (PERIDEX) 0.12 % solution chlorhexidine gluconate 0.12 % mouthwash  RINSE MOUTH WITH 15 ML (ONE CAPFUL) FOR 30 SECONDS IN THE MORNING AND EVENING AFTER TOOTHBRUSHING. SPIT OUT AFTER RINSING, DO NOT SWALLOW     clopidogrel (PLAVIX) 75 MG tablet clopidogrel 75 mg tablet  TAKE 1 TABLET (75 MG TOTAL) BY MOUTH DAILY.     docusate sodium (COLACE) 100 MG capsule Colace 100 mg capsule  Take 1 capsule every day by oral route.     EPINEPHrine 0.3 mg/0.3 mL IJ SOAJ injection Inject 0.3 mg into the muscle as needed. 1 each 1   famotidine (PEPCID)  40 MG tablet Take by mouth.     fluticasone (FLONASE) 50 MCG/ACT nasal spray Place 2 sprays into both nostrils daily. 16 g 3   gabapentin (NEURONTIN) 100 MG capsule take 1 capsule by mouth every twice each day.     ibuprofen (ADVIL) 800 MG tablet ibuprofen 800 mg tablet  TAKE 1 TABLET BY MOUTH EVERY 6 TO 8 HOURS AS NEEDED     levocetirizine (XYZAL) 5 MG tablet levocetirizine 5 mg tablet  TAKE 1 TABLET BY MOUTH IN THE EVENING     levothyroxine (SYNTHROID) 100 MCG tablet Take by mouth.     metoprolol succinate (TOPROL-XL) 25 MG 24 hr tablet Take 1 tablet by mouth daily.     Multiple Vitamin (MULTIVITAMIN WITH MINERALS) TABS tablet Take 1 tablet by mouth daily.     Olopatadine HCl (PATADAY) 0.2 % SOLN Place 1 drop into both eyes 2 (two) times daily as needed. 2.5 mL 5   fluticasone-salmeterol (ADVAIR HFA) 45-21 MCG/ACT inhaler Inhale 2 puffs into the lungs 2 (two) times daily. 1 each 12   loratadine (CLARITIN) 10 MG tablet Take 1 tablet (10 mg total) by mouth daily for 27 days. Take 1 (one) tablet as needed for allergy's 27 tablet 0   No current facility-administered medications for this visit.    Allergies: Allergies  Allergen Reactions   Shellfish Allergy Anaphylaxis    Throat closes up   I reviewed her past medical history, social history, family history, and environmental history and no significant changes have been reported from her previous visit.  ROS: All others negative except as noted per HPI.   Objective: BP 120/78   Pulse (!) 101   Temp (!) 97.5 F (36.4 C) (Temporal)   Resp 18   SpO2 98%  There is no height or weight on file to calculate BMI. General Appearance:  Alert, cooperative, no distress, appears stated age  Head:  Normocephalic, without obvious abnormality, atraumatic  Eyes:  Conjunctiva clear, EOM's intact  Nose: Nares normal,  erythematous and edematous nasal mucosa with clear rhinnorhea, sinus tenderness on left maxillary sinus, no visible anterior polyps, and septum midline  Throat: Lips, tongue normal; teeth and gums normal, normal posterior oropharynx  Neck: Supple, symmetrical  Lungs:   clear to auscultation bilaterally, Respirations unlabored, no coughing  Heart:  2/6 systolic murmur heard best on the left upper sternal border and regular rate and rhythm, Appears well perfused  Extremities: No edema  Skin: Skin color, texture, turgor normal, no rashes or lesions on visualized portions of skin  Neurologic: No gross deficits   Previous notes and tests were reviewed. The plan was reviewed with the patient/family, and all questions/concerned were addressed.  It was my pleasure to see Rebecca Sutton today and participate in her care. Please feel free to contact me with any questions or concerns.  Sincerely,  Ferol Luz, MD  Allergy & Immunology  [facility] Office: [phone]
